# Patient Record
Sex: Male | Born: 1970 | Race: Black or African American | Hispanic: No | Marital: Married | Smoking: Never smoker
Health system: Southern US, Community
[De-identification: ages and names within clinical notes are randomized; demographics above are authoritative.]

## PROBLEM LIST (undated history)

## (undated) DIAGNOSIS — M1990 Unspecified osteoarthritis, unspecified site: Secondary | ICD-10-CM

## (undated) DIAGNOSIS — E785 Hyperlipidemia, unspecified: Secondary | ICD-10-CM

## (undated) DIAGNOSIS — T7840XA Allergy, unspecified, initial encounter: Secondary | ICD-10-CM

## (undated) DIAGNOSIS — K259 Gastric ulcer, unspecified as acute or chronic, without hemorrhage or perforation: Secondary | ICD-10-CM

## (undated) DIAGNOSIS — A048 Other specified bacterial intestinal infections: Secondary | ICD-10-CM

## (undated) HISTORY — PX: KNEE SURGERY: SHX244

## (undated) HISTORY — DX: Gastric ulcer, unspecified as acute or chronic, without hemorrhage or perforation: K25.9

## (undated) HISTORY — DX: Hyperlipidemia, unspecified: E78.5

## (undated) HISTORY — DX: Unspecified osteoarthritis, unspecified site: M19.90

## (undated) HISTORY — DX: Allergy, unspecified, initial encounter: T78.40XA

## (undated) HISTORY — DX: Other specified bacterial intestinal infections: A04.8

## (undated) HISTORY — PX: NO PAST SURGERIES: SHX2092

---

## 2009-10-01 ENCOUNTER — Ambulatory Visit: Payer: Self-pay | Admitting: Family Medicine

## 2009-10-01 DIAGNOSIS — M62838 Other muscle spasm: Secondary | ICD-10-CM | POA: Insufficient documentation

## 2009-10-01 DIAGNOSIS — L819 Disorder of pigmentation, unspecified: Secondary | ICD-10-CM

## 2009-10-08 ENCOUNTER — Encounter: Payer: Self-pay | Admitting: Family Medicine

## 2009-10-22 ENCOUNTER — Ambulatory Visit: Payer: Self-pay | Admitting: Family Medicine

## 2009-10-22 DIAGNOSIS — N50812 Left testicular pain: Secondary | ICD-10-CM | POA: Insufficient documentation

## 2009-10-22 DIAGNOSIS — N508 Other specified disorders of male genital organs: Secondary | ICD-10-CM | POA: Insufficient documentation

## 2009-10-23 ENCOUNTER — Encounter: Admission: RE | Admit: 2009-10-23 | Discharge: 2009-10-23 | Payer: Self-pay | Admitting: Family Medicine

## 2009-10-23 LAB — CONVERTED CEMR LAB
Albumin: 4.4 g/dL (ref 3.5–5.2)
Alkaline Phosphatase: 62 units/L (ref 39–117)
BUN: 16 mg/dL (ref 6–23)
Basophils Relative: 0.5 % (ref 0.0–3.0)
Calcium: 9.6 mg/dL (ref 8.4–10.5)
Chloride: 103 meq/L (ref 96–112)
Direct LDL: 150.1 mg/dL
Eosinophils Absolute: 0.2 10*3/uL (ref 0.0–0.7)
GFR calc non Af Amer: 52.12 mL/min (ref >60)
Hemoglobin: 17 g/dL (ref 13.0–17.0)
Monocytes Relative: 6.4 % (ref 3.0–12.0)
Neutrophils Relative %: 65.9 % (ref 43.0–77.0)
Platelets: 174 10*3/uL (ref 150.0–400.0)
Potassium: 4.2 meq/L (ref 3.5–5.1)
RBC: 5.6 M/uL (ref 4.22–5.81)
RDW: 14.1 % (ref 11.5–14.6)
TSH: 1.96 microintl units/mL (ref 0.35–5.50)
Total Protein: 7.7 g/dL (ref 6.0–8.3)
WBC: 7.6 10*3/uL (ref 4.5–10.5)

## 2009-10-24 ENCOUNTER — Telehealth (INDEPENDENT_AMBULATORY_CARE_PROVIDER_SITE_OTHER): Payer: Self-pay | Admitting: *Deleted

## 2010-05-12 NOTE — Progress Notes (Signed)
Summary: ultrasound-lmom 7/15, 7/18. 7/21  Phone Note Outgoing Call   Call placed by: Doristine Devoid CMA,  October 24, 2009 2:57 PM Call placed to: Patient Summary of Call: varicocele is a normal variation and nothing to be concerned about  Follow-up for Phone Call        left message on machine ........Marland KitchenDoristine Devoid CMA  October 24, 2009 2:57 PM   Additional Follow-up for Phone Call Additional follow up Details #1::        Returned Pt call: voicemail said unavailabe at this time 9:06am.................................[facility], CMA...Marland KitchenMarland Kitchen7/18/11 Additional Follow-up by: Kathrynn Speed CMA,  October 27, 2009 9:06 AM    Additional Follow-up for Phone Call Additional follow up Details #2::    Left message on voicemail to call back to office. Lucious Groves CMA  October 30, 2009 4:34 PM   no return call will await patient phone call .....Marland KitchenMarland KitchenDoristine Devoid CMA  November 03, 2009 9:13 AM

## 2010-05-12 NOTE — Assessment & Plan Note (Signed)
Summary: CPX/PT WILL BE FASTING//KN   Vital Signs:  Patient profile:   40 year old male Height:      72 inches Weight:      233 pounds BMI:     31.71 Pulse rate:   66 / minute BP sitting:   126 / 74  (left arm)  Vitals Entered By: Doristine Devoid CMA (October 22, 2009 8:29 AM) CC: CPX AND LABS   History of Present Illness: 40 yo man here today for CPE.  no concerns.  Preventive Screening-Counseling & Management  Alcohol-Tobacco     Alcohol drinks/day: 0     Smoking Status: never  Caffeine-Diet-Exercise     Does Patient Exercise: yes      Sexual History:  currently monogamous.        Drug Use:  never.    Problems Prior to Update: 1)  Testicular Mass, Left  (ICD-608.89) 2)  Physical Examination  (ICD-V70.0) 3)  Lentigo  (ICD-709.09) 4)  Muscle Spasm, Trapezius  (ICD-728.85)  Current Medications (verified): 1)  Naproxen 500 Mg Tabs (Naproxen) .Marland Kitchen.. 1 Tab By Mouth Two Times A Day X7-10 Days and Then As Needed.  Take W/ Food.  Allergies (verified): No Known Drug Allergies  Past History:  Past Medical History: Last updated: 10/01/2009 none reported  Past Surgical History: Last updated: 10/01/2009 none reported  Family History: Last updated: 10/01/2009 CAD-no HTN-grandparents DM-no STOKE-maternal grandparent COLON CA-no PROSTATE CA-no  Social History: Engaged to [employer] Never Smoked Alcohol use-no Drug use-no Does Patient Exercise:  yes Sexual History:  currently monogamous Drug Use:  never  Review of Systems  The patient denies anorexia, fever, weight loss, weight gain, vision loss, decreased hearing, hoarseness, chest pain, syncope, dyspnea on exertion, peripheral edema, prolonged cough, headaches, abdominal pain, melena, hematochezia, severe indigestion/heartburn, hematuria, suspicious skin lesions, depression, abnormal bleeding, enlarged lymph nodes, and testicular masses.    Physical Exam  General:  Well-developed,well-nourished,in no  acute distress; alert,appropriate and cooperative throughout examination Head:  Normocephalic and atraumatic without obvious abnormalities. No apparent alopecia or balding. Eyes:  No corneal or conjunctival inflammation noted. EOMI. Perrla. Funduscopic exam benign, without hemorrhages, exudates or papilledema. Vision grossly normal. Ears:  External ear exam shows no significant lesions or deformities.  Otoscopic examination reveals clear canals, tympanic membranes are intact bilaterally without bulging, retraction, inflammation or discharge. Hearing is grossly normal bilaterally. Nose:  External nasal examination shows no deformity or inflammation. Nasal mucosa are pink and moist without lesions or exudates.  + turbinate edema Mouth:  Oral mucosa and oropharynx without lesions or exudates.  Teeth in good repair. Neck:  No deformities, masses, or tenderness noted. Lungs:  Normal respiratory effort, chest expands symmetrically. Lungs are clear to auscultation, no crackles or wheezes. Heart:  Normal rate and regular rhythm. S1 and S2 normal without gallop, murmur, click, rub or other extra sounds. Abdomen:  Bowel sounds positive,abdomen soft and non-tender without masses, organomegaly or hernias noted. Genitalia:  Testes bilaterally descended with small, firm nodule on posterior/superior L testicle. no tenderness. No scrotal masses or lesions. No penis lesions or urethral discharge. Msk:  No deformity or scoliosis noted of thoracic or lumbar spine.   Pulses:  +2 carotid, radial, DP Extremities:  No clubbing, cyanosis, edema, or deformity noted with normal full range of motion of all joints.   Neurologic:  alert & oriented X3, cranial nerves II-XII intact, strength normal in all extremities, sensation intact to light touch, and DTRs symmetrical and normal.   Skin:  large hyperpigmented area on R medial calf, no plaques or papules Cervical Nodes:  No lymphadenopathy noted Inguinal Nodes:  No significant  adenopathy Psych:  Cognition and judgment appear intact. Alert and cooperative with normal attention span and concentration. No apparent delusions, illusions, hallucinations   Impression & Recommendations:  Problem # 1:  PHYSICAL EXAMINATION (ICD-V70.0) Assessment New pt's PE WNL w/ exception of testicular mass (see below).  check labs.  anticipatory guidance provided. Orders: Venipuncture (54098) TLB-Lipid Panel (80061-LIPID) TLB-BMP (Basic Metabolic Panel-BMET) (80048-METABOL) TLB-CBC Platelet - w/Differential (85025-CBCD) TLB-Hepatic/Liver Function Pnl (80076-HEPATIC) TLB-TSH (Thyroid Stimulating Hormone) (84443-TSH)  Problem # 2:  TESTICULAR MASS, LEFT (JXB-147.82) Assessment: New need [place] evaluation Orders: Radiology Referral (Radiology)  Complete Medication List: 1)  Naproxen 500 Mg Tabs (Naproxen) .Marland Kitchen.. 1 tab by mouth two times a day x7-10 days and then as needed.  take w/ food.  Patient Instructions: 1)  Follow up in 1 year or as needed 2)  We'll notify you of your lab results 3)  Someone will call you with your ultrasound appt 4)  Keep up the good work on diet and exercise 5)  Call with any questions or concerns 6)  Have a great time at the beach!

## 2010-05-12 NOTE — Assessment & Plan Note (Signed)
Summary: NEW TO EST--PH   Vital Signs:  Patient profile:   40 year old male Height:      72 inches Weight:      235 pounds BMI:     31.99 Pulse rate:   60 / minute BP sitting:   110 / 80  (left arm)  Vitals Entered By: Doristine Devoid (October 01, 2009 3:44 PM) CC: NEW EST-    History of Present Illness: 40 yo man here today to establish care.  has not seen MD for a physical since childhood.  1) neck pain- pt works out frequently and 18 months ago 'pulled something' in his neck.  R sided.  no pain w/ working out but pain w/ movement of the neck.  never required time away from exercise.  not using anything for pain.  no numbness or tingling down the arm.  2) Lentigo- pt was dx'd as a child, reports it will 'act up'.  area on R calf is very itchy intermittantly.  hyperpigmented.  pt doesn't feel area is spreading.  mom feels area is lichen planus b/c this is what she has.  Preventive Screening-Counseling & Management  Alcohol-Tobacco     Smoking Status: never      Drug Use:  no.    Allergies (verified): No Known Drug Allergies  Past History:  Past Medical History: none reported  Past Surgical History: none reported  Family History: CAD-no HTN-grandparents DM-no STOKE-maternal grandparent COLON CA-no PROSTATE CA-no  Social History: Single Never Smoked Alcohol use-no Drug use-no Smoking Status:  never Drug Use:  no  Review of Systems      See HPI  Physical Exam  General:  Well-developed,well-nourished,in no acute distress; alert,appropriate and cooperative throughout examination Neck:  + trapezius spasm on R Msk:  full ROM of shoulders bilaterally full flexion/extension of neck Pulses:  +2 carotid, radial Extremities:  no C/C/E Neurologic:  alert & oriented X3, cranial nerves II-XII intact, strength normal in all extremities, sensation intact to light touch, and DTRs symmetrical and normal.   Skin:  large hyperpigmented area on R medial calf, no plaques or  papules Cervical Nodes:  No lymphadenopathy noted   Impression & Recommendations:  Problem # 1:  MUSCLE SPASM, TRAPEZIUS (ICD-728.85) Assessment New pt's pain is likely due to his trapezius spasm.  start NSAIDs, reviewed heat and ice regimen before/after exercise.  no numbness or weakness on neuro exam or hx.  reviewed supportive care and red flags that should prompt return.  Pt expresses understanding and is in agreement w/ this plan.  Problem # 2:  LENTIGO (ICD-709.09) Assessment: New not sure if area is truly lentigo.  will refer to derm given increased risk of melanoma in affected area if it is lentigo.  Orders: Dermatology Referral (Derma)  Complete Medication List: 1)  Naproxen 500 Mg Tabs (Naproxen) .Marland Kitchen.. 1 tab by mouth two times a day x7-10 days and then as needed.  take w/ food.  Patient Instructions: 1)  Please schedule a complete physical in the next 2-4 weeks- do not eat or drink before this appt 2)  Take the Naproxen as directed- take w/ food.  This will decrease the muscle inflammation 3)  Use heat before exercise, ice after 4)  Someone will call you with your dermatology appt 5)  Welcome!  We're glad to have you! Prescriptions: NAPROXEN 500 MG TABS (NAPROXEN) 1 tab by mouth two times a day x7-10 days and then as needed.  take w/ food.  #60 x  1   Entered and Authorized by:   Neena Rhymes MD   Signed by:   Neena Rhymes MD on 10/01/2009   Method used:   Electronically to        [facility] (retail)       [address]       [place], [place]  [address]       Ph: [phone]       Fax: [phone]   RxID:   [id number]

## 2010-05-12 NOTE — Consult Note (Signed)
Summary: [facility]   Imported By: Lanelle Bal 10/20/2009 09:47:09  _____________________________________________________________________  External Attachment:    Type:   Image     Comment:   External Document

## 2011-11-04 ENCOUNTER — Encounter: Payer: Self-pay | Admitting: Family Medicine

## 2011-11-04 ENCOUNTER — Ambulatory Visit (INDEPENDENT_AMBULATORY_CARE_PROVIDER_SITE_OTHER): Payer: 59 | Admitting: Family Medicine

## 2011-11-04 VITALS — BP 132/78 | HR 67 | Temp 99.1°F | Ht 71.0 in | Wt 226.0 lb

## 2011-11-04 DIAGNOSIS — Z0000 Encounter for general adult medical examination without abnormal findings: Secondary | ICD-10-CM

## 2011-11-04 DIAGNOSIS — Z23 Encounter for immunization: Secondary | ICD-10-CM

## 2011-11-04 DIAGNOSIS — Z20828 Contact with and (suspected) exposure to other viral communicable diseases: Secondary | ICD-10-CM

## 2011-11-04 NOTE — Patient Instructions (Addendum)
Follow up in 1 year or as needed We'll notify you of your lab results Keep up the good work!  You look great! Call with any questions or concerns Congrats on the baby!!!

## 2011-11-04 NOTE — Progress Notes (Signed)
  Subjective:    Patient ID: William Leon, male    DOB: Feb 08, 1971, 41 y.o.   MRN: [id number]  HPI CPE- no concerns today except for decreased energy level.  Works in the heat.   Review of Systems Patient reports no vision/hearing changes, anorexia, fever ,adenopathy, persistant/recurrent hoarseness, swallowing issues, chest pain, palpitations, edema, persistant/recurrent cough, hemoptysis, dyspnea (rest,exertional, paroxysmal nocturnal), gastrointestinal  bleeding (melena, rectal bleeding), abdominal pain, excessive heart burn, GU symptoms (dysuria, hematuria, voiding/incontinence issues) syncope, focal weakness, memory loss, numbness & tingling, skin/hair/nail changes, depression, anxiety, abnormal bruising/bleeding, musculoskeletal symptoms/signs.     Objective:   Physical Exam BP 132/78  Pulse 67  Temp 99.1 F (37.3 C) (Oral)  Ht 5\' 11"  (1.803 m)  Wt 226 lb (102.513 kg)  BMI 31.52 kg/m2  SpO2 97%  General Appearance:    Alert, cooperative, no distress, appears stated age  Head:    Normocephalic, without obvious abnormality, atraumatic  Eyes:    PERRL, conjunctiva/corneas clear, EOM's intact, fundi    benign, both eyes       Ears:    Normal TM's and external ear canals, both ears  Nose:   Nares normal, septum midline, mucosa normal, no drainage   or sinus tenderness  Throat:   Lips, mucosa, and tongue normal; teeth and gums normal  Neck:   Supple, symmetrical, trachea midline, no adenopathy;       thyroid:  No enlargement/tenderness/nodules  Back:     Symmetric, no curvature, ROM normal, no CVA tenderness  Lungs:     Clear to auscultation bilaterally, respirations unlabored  Chest wall:    No tenderness or deformity  Heart:    Regular rate and rhythm, S1 and S2 normal, no murmur, rub   or gallop  Abdomen:     Soft, non-tender, bowel sounds active all four quadrants,    no masses, no organomegaly  Genitalia:    Normal male without lesion, discharge or tenderness  Rectal:     Deferred due to young age  Extremities:   Extremities normal, atraumatic, no cyanosis or edema  Pulses:   2+ and symmetric all extremities  Skin:   Skin color, texture, turgor normal, no rashes or lesions  Lymph nodes:   Cervical, supraclavicular, and axillary nodes normal  Neurologic:   CNII-XII intact. Normal strength, sensation and reflexes      throughout          Assessment & Plan:

## 2011-11-04 NOTE — Assessment & Plan Note (Signed)
Pt's PE WNL.  Check labs.  Tdap given.  Anticipatory guidance provided.

## 2011-11-05 LAB — CBC WITH DIFFERENTIAL/PLATELET
Basophils Relative: 0.1 % (ref 0.0–3.0)
Lymphocytes Relative: 25.2 % (ref 12.0–46.0)
Lymphs Abs: 2 10*3/uL (ref 0.7–4.0)
MCHC: 33.5 g/dL (ref 30.0–36.0)
MCV: 87.8 fl (ref 78.0–100.0)
Monocytes Absolute: 0.3 10*3/uL (ref 0.1–1.0)
Neutro Abs: 5.3 10*3/uL (ref 1.4–7.7)

## 2011-11-05 LAB — BASIC METABOLIC PANEL
BUN: 19 mg/dL (ref 6–23)
CO2: 27 mEq/L (ref 19–32)
Calcium: 9.2 mg/dL (ref 8.4–10.5)
Chloride: 104 mEq/L (ref 96–112)
Creatinine, Ser: 1.6 mg/dL — ABNORMAL HIGH (ref 0.4–1.5)
GFR: 51.98 mL/min — ABNORMAL LOW (ref >60.00)
Glucose, Bld: 86 mg/dL (ref 70–99)

## 2011-11-05 LAB — HEPATIC FUNCTION PANEL
Albumin: 4.2 g/dL (ref 3.5–5.2)
Alkaline Phosphatase: 55 U/L (ref 39–117)

## 2011-11-05 LAB — LIPID PANEL: Triglycerides: 165 mg/dL — ABNORMAL HIGH (ref 0.0–149.0)

## 2011-11-08 ENCOUNTER — Encounter: Payer: Self-pay | Admitting: *Deleted

## 2011-11-12 ENCOUNTER — Encounter: Payer: Self-pay | Admitting: *Deleted

## 2012-08-04 ENCOUNTER — Encounter: Payer: Self-pay | Admitting: *Deleted

## 2012-08-04 ENCOUNTER — Encounter: Payer: Self-pay | Admitting: Internal Medicine

## 2012-08-04 ENCOUNTER — Ambulatory Visit (INDEPENDENT_AMBULATORY_CARE_PROVIDER_SITE_OTHER): Payer: 59 | Admitting: Internal Medicine

## 2012-08-04 ENCOUNTER — Ambulatory Visit (INDEPENDENT_AMBULATORY_CARE_PROVIDER_SITE_OTHER)
Admission: RE | Admit: 2012-08-04 | Discharge: 2012-08-04 | Disposition: A | Discharge status: Home / Self Care | Payer: 59 | Source: Ambulatory Visit | Attending: Internal Medicine | Admitting: Internal Medicine

## 2012-08-04 ENCOUNTER — Encounter: Payer: Self-pay | Admitting: Family Medicine

## 2012-08-04 VITALS — BP 122/82 | HR 68 | Wt 229.0 lb

## 2012-08-04 DIAGNOSIS — S99922A Unspecified injury of left foot, initial encounter: Secondary | ICD-10-CM | POA: Insufficient documentation

## 2012-08-04 DIAGNOSIS — S99929A Unspecified injury of unspecified foot, initial encounter: Secondary | ICD-10-CM

## 2012-08-04 DIAGNOSIS — S8990XA Unspecified injury of unspecified lower leg, initial encounter: Secondary | ICD-10-CM

## 2012-08-04 NOTE — Patient Instructions (Signed)
Please get your x-ray at the other [facility]  office located at: [address], across from [facility].  Please go to the basement, this is a walk-in facility, they are open from 8:30 to 5:30 PM. Phone number [phone].  

## 2012-08-04 NOTE — Assessment & Plan Note (Addendum)
Severe pain @ L heel after a injury last night, unable to bear weight Plan:  XR , ortho Today, patient cell phone [phone] Unfortunately we don't have crutches his size, i apologized

## 2012-08-04 NOTE — Progress Notes (Signed)
  Subjective:    Patient ID: Natalie Leclaire, male    DOB: 07-22-1970, 42 y.o.   MRN: [id number]  HPI Acute visit Landed on his left heel last night, he hurt immediately after the accident, pain worse today, he also has noted some swelling. Unable to bear weight.  No past medical history on file.  Past Surgical History  Procedure Laterality Date  . No past surgeries       Review of Systems Denies any other injuries. No ankle pain or swelling. No previous injury in that area.     Objective:   Physical Exam  General -- alert, well-developed, Unable to bear weight .   Extremities--  no pretibial edema bilaterally ankles normal bilaterally. Right heel normal Left heel without deformities, mild swelling medially, very tender to palpation at the plantar aspect.  Neurologic-- alert & oriented X3 and strength normal in all extremities. Psych-- Cognition and judgment appear intact. Alert and cooperative with normal attention span and concentration.  not anxious appearing and not depressed appearing.       Assessment & Plan:

## 2012-12-15 ENCOUNTER — Encounter: Payer: 59 | Admitting: Family Medicine

## 2013-01-02 ENCOUNTER — Telehealth: Payer: Self-pay

## 2013-01-02 NOTE — Telephone Encounter (Signed)
No medications for this patient Flu Vaccine at work Had recent lab work for his job; advised to bring with him Would like to have STD test ran

## 2013-01-02 NOTE — Telephone Encounter (Signed)
LVM for CB Due for flu vaccine No other HM findings

## 2013-01-03 ENCOUNTER — Encounter: Payer: Self-pay | Admitting: Family Medicine

## 2013-01-03 ENCOUNTER — Ambulatory Visit (INDEPENDENT_AMBULATORY_CARE_PROVIDER_SITE_OTHER): Payer: PRIVATE HEALTH INSURANCE | Admitting: Family Medicine

## 2013-01-03 VITALS — BP 120/82 | HR 82 | Temp 98.3°F | Ht 71.0 in | Wt 231.2 lb

## 2013-01-03 DIAGNOSIS — H6120 Impacted cerumen, unspecified ear: Secondary | ICD-10-CM

## 2013-01-03 DIAGNOSIS — S99922D Unspecified injury of left foot, subsequent encounter: Secondary | ICD-10-CM

## 2013-01-03 DIAGNOSIS — Z20828 Contact with and (suspected) exposure to other viral communicable diseases: Secondary | ICD-10-CM

## 2013-01-03 DIAGNOSIS — H6122 Impacted cerumen, left ear: Secondary | ICD-10-CM

## 2013-01-03 DIAGNOSIS — Z0000 Encounter for general adult medical examination without abnormal findings: Secondary | ICD-10-CM

## 2013-01-03 DIAGNOSIS — Z5189 Encounter for other specified aftercare: Secondary | ICD-10-CM

## 2013-01-03 LAB — BASIC METABOLIC PANEL
BUN: 19 mg/dL (ref 6–23)
Creatinine, Ser: 1.6 mg/dL — ABNORMAL HIGH (ref 0.4–1.5)
GFR: 61.2 mL/min (ref >60.00)
Glucose, Bld: 91 mg/dL (ref 70–99)
Potassium: 3.8 mEq/L (ref 3.5–5.1)
Sodium: 140 mEq/L (ref 135–145)

## 2013-01-03 LAB — CBC WITH DIFFERENTIAL/PLATELET
Basophils Absolute: 0 10*3/uL (ref 0.0–0.1)
Eosinophils Absolute: 0.3 10*3/uL (ref 0.0–0.7)
Eosinophils Relative: 4 % (ref 0.0–5.0)
HCT: 50.2 % (ref 39.0–52.0)
Hemoglobin: 17 g/dL (ref 13.0–17.0)
MCHC: 33.9 g/dL (ref 30.0–36.0)
Monocytes Absolute: 0.5 10*3/uL (ref 0.1–1.0)
Neutro Abs: 5 10*3/uL (ref 1.4–7.7)
Platelets: 190 10*3/uL (ref 150.0–400.0)

## 2013-01-03 LAB — HEPATIC FUNCTION PANEL
ALT: 36 U/L (ref 0–53)
AST: 38 U/L — ABNORMAL HIGH (ref 0–37)
Alkaline Phosphatase: 51 U/L (ref 39–117)
Bilirubin, Direct: 0 mg/dL (ref 0.0–0.3)
Total Protein: 6.9 g/dL (ref 6.0–8.3)

## 2013-01-03 LAB — LIPID PANEL: VLDL: 28.2 mg/dL (ref 0.0–40.0)

## 2013-01-03 LAB — TSH: TSH: 0.76 u[IU]/mL (ref 0.35–5.50)

## 2013-01-03 NOTE — Patient Instructions (Addendum)
We'll notify you of your lab results and make any changes if needed Keep up the good work!  You look great! Call with any questions or concerns Happy Fall! 

## 2013-01-03 NOTE — Progress Notes (Signed)
  Subjective:    Patient ID: Jawann Urbani, male    DOB: 08-Feb-1971, 42 y.o.   MRN: [id number]  HPI CPE- no concerns, not fasting this AM (had high fat breakfast).  Had insurance screening in July and fasting sugar and cholesterol was normal.  L heel injury- saw [facility], wore cast x2 weeks and saw ortho 3x in that time (total $1200).  Continues to have L heel pain in the AM but this improves as the day goes on.  Interested in seeing Sports Med to ensure healing.   Review of Systems Patient reports no vision/hearing changes, anorexia, fever ,adenopathy, persistant/recurrent hoarseness, swallowing issues, chest pain, palpitations, edema, persistant/recurrent cough, hemoptysis, dyspnea (rest,exertional, paroxysmal nocturnal), gastrointestinal  bleeding (melena, rectal bleeding), abdominal pain, excessive heart burn, GU symptoms (dysuria, hematuria, voiding/incontinence issues) syncope, focal weakness, memory loss, numbness & tingling, skin/hair/nail changes, depression, anxiety, abnormal bruising/bleeding.     Objective:   Physical Exam BP 120/82  Pulse 82  Temp(Src) 98.3 F (36.8 C) (Oral)  Ht 5\' 11"  (1.803 m)  Wt 231 lb 3.2 oz (104.872 kg)  BMI 32.26 kg/m2  SpO2 97%  General Appearance:    Alert, cooperative, no distress, appears stated age  Head:    Normocephalic, without obvious abnormality, atraumatic  Eyes:    PERRL, conjunctiva/corneas clear, EOM's intact, fundi    benign, both eyes       Ears:    L ear w/ cerumen impaction- removed w/ curette and forceps.  TMs WNL bilaterally  Nose:   Nares normal, septum midline, mucosa normal, no drainage   or sinus tenderness  Throat:   Lips, mucosa, and tongue normal; teeth and gums normal  Neck:   Supple, symmetrical, trachea midline, no adenopathy;       thyroid:  No enlargement/tenderness/nodules; no carotid   bruit or JVD  Back:     Symmetric, no curvature, ROM normal, no CVA tenderness  Lungs:     Clear to auscultation bilaterally,  respirations unlabored  Chest wall:    No tenderness or deformity  Heart:    Regular rate and rhythm, S1 and S2 normal, no murmur, rub   or gallop  Abdomen:     Soft, non-tender, bowel sounds active all four quadrants,    no masses, no organomegaly  Genitalia:    Normal male without lesion, discharge or tenderness  Rectal:    Deferred due to young age  Extremities:   Extremities normal, atraumatic, no cyanosis or edema  Pulses:   2+ and symmetric all extremities  Skin:   Skin color, texture, turgor normal, no rashes or lesions  Lymph nodes:   Cervical, supraclavicular, and axillary nodes normal  Neurologic:   CNII-XII intact. Normal strength, sensation and reflexes      throughout          Assessment & Plan:

## 2013-01-04 ENCOUNTER — Telehealth: Payer: Self-pay | Admitting: Family Medicine

## 2013-01-04 LAB — GC/CHLAMYDIA PROBE AMP

## 2013-01-04 NOTE — Telephone Encounter (Signed)
Called pt and LMOVM to return call.  °

## 2013-01-04 NOTE — Telephone Encounter (Signed)
It seems that pt did not give sample- if he still wants to be checked for gonorrhea or chlamydia, he'll need to swing by and give a urine sample

## 2013-01-04 NOTE — Telephone Encounter (Signed)
[facility] lab is calling because they did not receive any urine for the GC/Chlamydia Probe order they received. Needs to be re-submitted if patient still needs.

## 2013-01-05 ENCOUNTER — Encounter: Payer: Self-pay | Admitting: *Deleted

## 2013-01-05 NOTE — Telephone Encounter (Signed)
Spoke with pt.notified that we would need another urine sample.

## 2013-01-05 NOTE — Telephone Encounter (Signed)
Called and left another message on pt's machine.

## 2013-01-07 DIAGNOSIS — H6120 Impacted cerumen, unspecified ear: Secondary | ICD-10-CM | POA: Insufficient documentation

## 2013-01-07 NOTE — Assessment & Plan Note (Signed)
New to provider.  Pt was following w/ ortho but was struggling to pay ortho co-pays and charges.  Never followed up to injury resolution.  Refer to primary care sports med.  Pt expressed understanding and is in agreement w/ plan.

## 2013-01-07 NOTE — Assessment & Plan Note (Signed)
Pt's PE WNL w/ exception of L cerumen impaction.  Check labs.  Anticipatory guidance provided.

## 2013-01-07 NOTE — Assessment & Plan Note (Signed)
New.  Removed w/ curette and forceps.  Pt's sxs of ear fullness and decreased hearing immediately improved.

## 2014-02-08 ENCOUNTER — Encounter: Payer: Self-pay | Admitting: Family Medicine

## 2014-02-08 ENCOUNTER — Ambulatory Visit (INDEPENDENT_AMBULATORY_CARE_PROVIDER_SITE_OTHER): Payer: PRIVATE HEALTH INSURANCE | Admitting: Family Medicine

## 2014-02-08 VITALS — BP 116/74 | HR 71 | Temp 98.2°F | Resp 16 | Ht 72.0 in | Wt 238.0 lb

## 2014-02-08 DIAGNOSIS — Z0000 Encounter for general adult medical examination without abnormal findings: Secondary | ICD-10-CM

## 2014-02-08 DIAGNOSIS — Z202 Contact with and (suspected) exposure to infections with a predominantly sexual mode of transmission: Secondary | ICD-10-CM | POA: Insufficient documentation

## 2014-02-08 LAB — HEPATIC FUNCTION PANEL
ALBUMIN: 3.6 g/dL (ref 3.5–5.2)
ALT: 31 U/L (ref 0–53)
AST: 31 U/L (ref 0–37)
Alkaline Phosphatase: 47 U/L (ref 39–117)
BILIRUBIN DIRECT: 0 mg/dL (ref 0.0–0.3)
BILIRUBIN TOTAL: 1 mg/dL (ref 0.2–1.2)
Total Protein: 7.1 g/dL (ref 6.0–8.3)

## 2014-02-08 LAB — CBC WITH DIFFERENTIAL/PLATELET
BASOS ABS: 0 10*3/uL (ref 0.0–0.1)
Basophils Relative: 0.4 % (ref 0.0–3.0)
EOS ABS: 0.2 10*3/uL (ref 0.0–0.7)
EOS PCT: 2.5 % (ref 0.0–5.0)
HEMATOCRIT: 53.8 % — AB (ref 39.0–52.0)
HEMOGLOBIN: 17.6 g/dL — AB (ref 13.0–17.0)
Lymphocytes Relative: 25.7 % (ref 12.0–46.0)
Lymphs Abs: 1.9 10*3/uL (ref 0.7–4.0)
MCHC: 32.8 g/dL (ref 30.0–36.0)
MCV: 88.8 fl (ref 78.0–100.0)
MONOS PCT: 6.1 % (ref 3.0–12.0)
Monocytes Absolute: 0.5 10*3/uL (ref 0.1–1.0)
Neutro Abs: 4.9 10*3/uL (ref 1.4–7.7)
Neutrophils Relative %: 65.3 % (ref 43.0–77.0)
Platelets: 185 10*3/uL (ref 150.0–400.0)
RBC: 6.05 Mil/uL — AB (ref 4.22–5.81)
RDW: 14 % (ref 11.5–15.5)
WBC: 7.5 10*3/uL (ref 4.0–10.5)

## 2014-02-08 LAB — TSH: TSH: 0.84 u[IU]/mL (ref 0.35–4.50)

## 2014-02-08 LAB — BASIC METABOLIC PANEL
BUN: 18 mg/dL (ref 6–23)
CO2: 25 mEq/L (ref 19–32)
Calcium: 9.4 mg/dL (ref 8.4–10.5)
Chloride: 106 mEq/L (ref 96–112)
Creatinine, Ser: 1.8 mg/dL — ABNORMAL HIGH (ref 0.4–1.5)
GFR: 53.45 mL/min — AB (ref >60.00)
GLUCOSE: 89 mg/dL (ref 70–99)
POTASSIUM: 4.1 meq/L (ref 3.5–5.1)
SODIUM: 140 meq/L (ref 135–145)

## 2014-02-08 LAB — LIPID PANEL
CHOL/HDL RATIO: 5
Cholesterol: 197 mg/dL (ref 0–200)
HDL: 37.7 mg/dL — AB (ref >39.00)
LDL CALC: 143 mg/dL — AB (ref 0–99)
NONHDL: 159.3
Triglycerides: 83 mg/dL (ref 0.0–149.0)
VLDL: 16.6 mg/dL (ref 0.0–40.0)

## 2014-02-08 LAB — HIV ANTIBODY (ROUTINE TESTING W REFLEX): HIV 1&2 Ab, 4th Generation: NONREACTIVE

## 2014-02-08 NOTE — Patient Instructions (Signed)
Follow up in 1 year or as needed We'll notify you of your lab results and make any changes if needed Keep up the good work!  You look great! Call with any questions or concerns Hang in there!!!

## 2014-02-08 NOTE — Progress Notes (Signed)
   Subjective:    Patient ID: William Leon, male    DOB: 1970/09/07, 43 y.o.   MRN: [id number]  HPI CPE- pt requesting all STD testing after separating from fiance.   Review of Systems Patient reports no vision/hearing changes, anorexia, fever ,adenopathy, persistant/recurrent hoarseness, swallowing issues, chest pain, palpitations, edema, persistant/recurrent cough, hemoptysis, dyspnea (rest,exertional, paroxysmal nocturnal), gastrointestinal  bleeding (melena, rectal bleeding), abdominal pain, excessive heart burn, GU symptoms (dysuria, hematuria, voiding/incontinence issues) syncope, focal weakness, memory loss, numbness & tingling, skin/hair/nail changes, depression, anxiety, abnormal bruising/bleeding, musculoskeletal symptoms/signs.     Objective:   Physical Exam General Appearance:    Alert, cooperative, no distress, appears stated age.  Very muscular  Head:    Normocephalic, without obvious abnormality, atraumatic  Eyes:    PERRL, conjunctiva/corneas clear, EOM's intact, fundi    benign, both eyes       Ears:    Normal TM's and external ear canals, both ears  Nose:   Nares normal, septum midline, mucosa normal, no drainage   or sinus tenderness  Throat:   Lips, mucosa, and tongue normal; teeth and gums normal  Neck:   Supple, symmetrical, trachea midline, no adenopathy;       thyroid:  No enlargement/tenderness/nodules  Back:     Symmetric, no curvature, ROM normal, no CVA tenderness  Lungs:     Clear to auscultation bilaterally, respirations unlabored  Chest wall:    No tenderness or deformity  Heart:    Regular rate and rhythm, S1 and S2 normal, no murmur, rub   or gallop  Abdomen:     Soft, non-tender, bowel sounds active all four quadrants,    no masses, no organomegaly  Genitalia:    Normal male without lesion, masses,discharge or tenderness.  Small L varicocele noted  Rectal:    Deferred due to young age  Extremities:   Extremities normal, atraumatic, no cyanosis or edema    Pulses:   2+ and symmetric all extremities  Skin:   Skin color, texture, turgor normal, no rashes or lesions  Lymph nodes:   Cervical, supraclavicular, and axillary nodes normal  Neurologic:   CNII-XII intact. Normal strength, sensation and reflexes      throughout          Assessment & Plan:

## 2014-02-08 NOTE — Progress Notes (Signed)
Pre visit review using our clinic review tool, if applicable. No additional management support is needed unless otherwise documented below in the visit note. 

## 2014-02-09 LAB — GC/CHLAMYDIA PROBE AMP, URINE
CHLAMYDIA, SWAB/URINE, PCR: NEGATIVE
GC Probe Amp, Urine: NEGATIVE

## 2014-02-09 LAB — RPR

## 2014-02-10 NOTE — Assessment & Plan Note (Signed)
Check labs to r/o infxn.  Discussed need for safe sex at each encounter

## 2014-02-10 NOTE — Assessment & Plan Note (Signed)
Pt's PE WNL.  Very muscular but not obese despite BMI.  Check labs.  Anticipatory guidance provided.

## 2014-02-11 LAB — HSV(HERPES SMPLX)ABS-I+II(IGG+IGM)-BLD
HERPES SIMPLEX VRS I-IGM AB (EIA): 0.36 {index}
HSV 1 GLYCOPROTEIN G AB, IGG: 11.55 IV — AB
HSV 2 GLYCOPROTEIN G AB, IGG: 0.5 IV

## 2014-02-13 ENCOUNTER — Ambulatory Visit (HOSPITAL_BASED_OUTPATIENT_CLINIC_OR_DEPARTMENT_OTHER)
Admission: RE | Admit: 2014-02-13 | Discharge: 2014-02-13 | Disposition: A | Discharge status: Home / Self Care | Payer: PRIVATE HEALTH INSURANCE | Source: Ambulatory Visit | Attending: Medical | Admitting: Medical

## 2014-02-13 ENCOUNTER — Other Ambulatory Visit: Payer: Self-pay | Admitting: Family Medicine

## 2014-02-13 ENCOUNTER — Encounter: Payer: Self-pay | Admitting: Medical

## 2014-02-13 ENCOUNTER — Ambulatory Visit (INDEPENDENT_AMBULATORY_CARE_PROVIDER_SITE_OTHER): Payer: PRIVATE HEALTH INSURANCE | Admitting: Medical

## 2014-02-13 VITALS — BP 149/84 | HR 81 | Temp 98.1°F | Ht 72.0 in | Wt 245.8 lb

## 2014-02-13 DIAGNOSIS — M25521 Pain in right elbow: Secondary | ICD-10-CM

## 2014-02-13 DIAGNOSIS — R7989 Other specified abnormal findings of blood chemistry: Secondary | ICD-10-CM

## 2014-02-13 DIAGNOSIS — M25529 Pain in unspecified elbow: Secondary | ICD-10-CM | POA: Insufficient documentation

## 2014-02-13 MED ORDER — DICLOFENAC SODIUM 75 MG PO TBEC: 75.0000 mg | DELAYED_RELEASE_TABLET | Freq: Two times a day (BID) | ORAL | Status: DC

## 2014-02-13 NOTE — Patient Instructions (Addendum)
You may have suffered elbow contusion vs small fracture of elbow. We will get xray of your elbow today. I recommend you get elbow pad or ace wrap until we get xray result. If xray negative and pain persist by one week then may need to repeat xray as sometimes small fractures are not obvious until some healing takes place.  I will send in diclofenac for pain and inflammation.  Follow up in 7 days or as needed.

## 2014-02-13 NOTE — Assessment & Plan Note (Signed)
Pmay have suffered elbow contusion vs small fracture of elbow. We will get xray of your elbow today. I recommend you get elbow pad or ace wrap until we get xray result. If xray negative and pain persist by one week then may need to repeat xray as sometimes small fractures are not obvious until some healing takes place.  I will send in diclofenac for pain and inflammation.

## 2014-02-13 NOTE — Progress Notes (Signed)
Pre visit review using our clinic review tool, if applicable. No additional management support is needed unless otherwise documented below in the visit note. 

## 2014-02-13 NOTE — Progress Notes (Signed)
   Subjective:    Patient ID: William Leon, male    DOB: 07/21/1970, 42 y.o.   MRN: [id number]  HPI   Pt states he has some elbow pain. He hit his elbow on concrete post. Accidentally hit while walking. At the time had moderate to severe pain. Pain has subside but since then he has sharp pain even with mild touch.  No past medical history on file.  History   Social History  . Marital Status: Single    Spouse Name: N/A    Number of Children: N/A  . Years of Education: N/A   Occupational History  . Not on file.   Social History Main Topics  . Smoking status: Never Smoker   . Smokeless tobacco: Not on file  . Alcohol Use: No  . Drug Use: No  . Sexual Activity: Not on file   Other Topics Concern  . Not on file   Social History Narrative    Past Surgical History  Procedure Laterality Date  . No past surgeries      No family history on file.  No Known Allergies  No current outpatient prescriptions on file prior to visit.   No current facility-administered medications on file prior to visit.    BP 149/84 mmHg  Pulse 81  Temp(Src) 98.1 F (36.7 C) (Oral)  Ht 6' (1.829 m)  Wt 245 lb 12.8 oz (111.494 kg)  BMI 33.33 kg/m2  SpO2 97%     Review of Systems  Constitutional: Negative for fever, chills and fatigue.  Musculoskeletal:       Rt elbow pain. None at rest but if something presses even lightly over small area of elbow he will have moderate to severe pain.  Skin: Negative for color change and rash.  Hematological: Negative for adenopathy. Does not bruise/bleed easily.       Objective:   Physical Exam  Rt elbow- very difficult to find point tender area but did find  mid point over elbow very tender area about 8 mm in diameter. When found was very tender to light touch. Flexion and extension no pain.  Skin- no redness, no swelling and no bruise.        Assessment & Plan:

## 2014-02-22 ENCOUNTER — Other Ambulatory Visit (INDEPENDENT_AMBULATORY_CARE_PROVIDER_SITE_OTHER): Payer: PRIVATE HEALTH INSURANCE

## 2014-02-22 ENCOUNTER — Encounter: Payer: Self-pay | Admitting: General Practice

## 2014-02-22 DIAGNOSIS — R7989 Other specified abnormal findings of blood chemistry: Secondary | ICD-10-CM

## 2014-02-22 DIAGNOSIS — R748 Abnormal levels of other serum enzymes: Secondary | ICD-10-CM

## 2014-02-22 LAB — BASIC METABOLIC PANEL
BUN: 17 mg/dL (ref 6–23)
CALCIUM: 9.6 mg/dL (ref 8.4–10.5)
CO2: 28 meq/L (ref 19–32)
CREATININE: 1.7 mg/dL — AB (ref 0.4–1.5)
Chloride: 103 mEq/L (ref 96–112)
GFR: 57.53 mL/min — ABNORMAL LOW (ref >60.00)
GLUCOSE: 113 mg/dL — AB (ref 70–99)
Potassium: 4.2 mEq/L (ref 3.5–5.1)
Sodium: 140 mEq/L (ref 135–145)

## 2014-05-22 ENCOUNTER — Ambulatory Visit (INDEPENDENT_AMBULATORY_CARE_PROVIDER_SITE_OTHER): Payer: PRIVATE HEALTH INSURANCE | Admitting: Family Medicine

## 2014-05-22 ENCOUNTER — Encounter: Payer: Self-pay | Admitting: Family Medicine

## 2014-05-22 ENCOUNTER — Encounter: Payer: Self-pay | Admitting: General Practice

## 2014-05-22 VITALS — BP 134/78 | HR 70 | Temp 98.2°F | Resp 16 | Wt 247.4 lb

## 2014-05-22 DIAGNOSIS — R7989 Other specified abnormal findings of blood chemistry: Secondary | ICD-10-CM | POA: Insufficient documentation

## 2014-05-22 DIAGNOSIS — R748 Abnormal levels of other serum enzymes: Secondary | ICD-10-CM

## 2014-05-22 LAB — BASIC METABOLIC PANEL
BUN: 22 mg/dL (ref 6–23)
CO2: 28 mEq/L (ref 19–32)
Calcium: 9.3 mg/dL (ref 8.4–10.5)
Chloride: 104 mEq/L (ref 96–112)
Creatinine, Ser: 1.54 mg/dL — ABNORMAL HIGH (ref 0.40–1.50)
GFR: 63.1 mL/min (ref >60.00)
Glucose, Bld: 96 mg/dL (ref 70–99)
POTASSIUM: 3.8 meq/L (ref 3.5–5.1)
SODIUM: 137 meq/L (ref 135–145)

## 2014-05-22 NOTE — Patient Instructions (Signed)
Follow up for your yearly physical We'll notify you of your lab results and make any changes if needed Continue to drink plenty of fluids Avoid anti-inflammatories (Advil, Aleve, Motrin, etc) as much as possible Call with any questions or concerns Happy Valentine's Day!!!

## 2014-05-22 NOTE — Progress Notes (Signed)
   Subjective:    Patient ID: William Leon, male    DOB: [birth year], 44 y.o.   MRN: [id number]  HPI Elevated Cr- pt's levels have been stable x4 yrs.  He is concerned b/c he did some reading on the internet.  Doesn't take NSAIDs, no BP meds, no supplements.  Exercising regularly.   Review of Systems For ROS see HPI     Objective:   Physical Exam  Constitutional: He is oriented to person, place, and time. He appears well-developed and well-nourished. No distress.  HENT:  Head: Normocephalic and atraumatic.  Eyes: Conjunctivae and EOM are normal. Pupils are equal, round, and reactive to light.  Neck: Normal range of motion. Neck supple. No thyromegaly present.  Cardiovascular: Normal rate, regular rhythm, normal heart sounds and intact distal pulses.   No murmur heard. Pulmonary/Chest: Effort normal and breath sounds normal. No respiratory distress.  Abdominal: Soft. Bowel sounds are normal. He exhibits no distension.  Musculoskeletal: He exhibits no edema.  Lymphadenopathy:    He has no cervical adenopathy.  Neurological: He is alert and oriented to person, place, and time. No cranial nerve deficit.  Skin: Skin is warm and dry.  Psychiatric: He has a normal mood and affect. His behavior is normal.  Vitals reviewed.         Assessment & Plan:

## 2014-05-22 NOTE — Progress Notes (Signed)
Pre visit review using our clinic review tool, if applicable. No additional management support is needed unless otherwise documented below in the visit note. 

## 2014-05-22 NOTE — Assessment & Plan Note (Signed)
Reviewed variations in creatinine based on gender, muscle mass, and ethnicity.  Pt is a very muscular, African-American man and his Cr has been stable x4 yrs.  Will continue to monitor closely.  Reviewed need for increased water intake and avoidance of NSAIDs.  Repeat BMP.  Pt expressed understanding and is in agreement w/ plan.

## 2014-06-18 ENCOUNTER — Ambulatory Visit (INDEPENDENT_AMBULATORY_CARE_PROVIDER_SITE_OTHER): Payer: PRIVATE HEALTH INSURANCE | Admitting: Family Medicine

## 2014-06-18 ENCOUNTER — Encounter: Payer: Self-pay | Admitting: Family Medicine

## 2014-06-18 ENCOUNTER — Telehealth: Payer: Self-pay | Admitting: Family Medicine

## 2014-06-18 VITALS — BP 120/80 | HR 79 | Temp 98.2°F | Resp 16 | Wt 242.1 lb

## 2014-06-18 DIAGNOSIS — S39848A Other specified injuries of external genitals, initial encounter: Secondary | ICD-10-CM

## 2014-06-18 DIAGNOSIS — S3994XA Unspecified injury of external genitals, initial encounter: Secondary | ICD-10-CM | POA: Insufficient documentation

## 2014-06-18 DIAGNOSIS — R319 Hematuria, unspecified: Secondary | ICD-10-CM | POA: Insufficient documentation

## 2014-06-18 LAB — POCT URINALYSIS DIPSTICK
Bilirubin, UA: NEGATIVE
Blood, UA: 10
Glucose, UA: NEGATIVE
KETONES UA: NEGATIVE
Leukocytes, UA: NEGATIVE
Nitrite, UA: NEGATIVE
PH UA: 5.5
PROTEIN UA: NEGATIVE
Urobilinogen, UA: 0.2

## 2014-06-18 MED ORDER — CIPROFLOXACIN HCL 500 MG PO TABS: 500.0000 mg | ORAL_TABLET | Freq: Two times a day (BID) | ORAL | Status: DC

## 2014-06-18 NOTE — Telephone Encounter (Signed)
atient Name: William Leon  DOB: 09-08-1970    Initial Comment Caller States he is having blood in his urine. he does have an appt today @ 9:45am   Nurse Assessment  Nurse: Raphael Gibney, RN, Vanita Ingles Date/Time (Eastern Time): 06/18/2014 8:12:46 AM  Confirm and document reason for call. If symptomatic, describe symptoms. ---Caller states he has appt at 9:45 am today . Last night he was having intercourse with his wife, he felt a little bit of burning after he ejaculated He noticed last night a little bit of blood on his penis after he ejaculated. He noticed a little bit of blood after he urinated.  Has the patient traveled out of the country within the last 30 days? ---No  Does the patient require triage? ---Yes  Related visit to physician within the last 2 weeks? ---No  Does the PT have any chronic conditions? (i.e. diabetes, asthma, etc.) ---No     Guidelines    Guideline Title Affirmed Question Affirmed Notes  Penis and Scrotum Symptoms Blood in semen    Final Disposition User   See PCP When Office is Open (within 3 days) Raphael Gibney, Therapist, sports, Vanita Ingles

## 2014-06-18 NOTE — Telephone Encounter (Signed)
Appointment scheduled with Dr. Birdie Riddle at 9:45.

## 2014-06-18 NOTE — Progress Notes (Signed)
   Subjective:    Patient ID: William Leon, male    DOB: 08-10-1970, 44 y.o.   MRN: [id number]  HPI Hematuria- pt was attempting new sexual position w/ partner last night and 'hit something'.  Just prior to ejaculation and during ejaculation pt had painful sensation.  Afterwards, pt had drops of blood at the tip of penis.  Had a few drops on the sheets this AM when he woke up and had additional bleeding (drops of blood- not steady stream) w/ urinating this AM.  Faint burning w/ urination.   Review of Systems For ROS see HPI     Objective:   Physical Exam  Constitutional: He is oriented to person, place, and time. He appears well-developed and well-nourished. No distress.  HENT:  Head: Normocephalic and atraumatic.  Genitourinary: Penis normal. No penile tenderness.  Neurological: He is alert and oriented to person, place, and time.  Skin: Skin is warm and dry.  Psychiatric: He has a normal mood and affect. His behavior is normal. Thought content normal.  Vitals reviewed.         Assessment & Plan:

## 2014-06-18 NOTE — Assessment & Plan Note (Signed)
New.  Sustained during intercourse last night while trying a new position.  No obvious deformity of penis.  No visible blood on exam.  Suspect mild tear/urethral abrasion.  Start Cipro for possible infection.  If blood doesn't clear by Thursday, will refer to urology.  Pt expressed understanding and is in agreement w/ plan.

## 2014-06-18 NOTE — Progress Notes (Signed)
Pre visit review using our clinic review tool, if applicable. No additional management support is needed unless otherwise documented below in the visit note. 

## 2014-06-18 NOTE — Assessment & Plan Note (Signed)
New.  Suspect this is due to the penile trauma he sustained during intercourse last night.  Pt having some burning w/ urination and drops of blood at either the onset of urination or at the end of stream- remainder of stream is clear.  Send urine for culture.  Start Cipro in case of infxn.  Reviewed supportive care and red flags that should prompt return.  Pt expressed understanding and is in agreement w/ plan.

## 2014-06-18 NOTE — Patient Instructions (Signed)
If still having blood on Thursday AM, CALL ME so we can send you to urology Start the Cipro twice daily for possible infection We'll notify you of your culture results Start Claritin or Zyrtec daily for allergic symptoms Call with any questions or concerns Hang in there!  This is not unusual and will get better!

## 2014-06-18 NOTE — Addendum Note (Signed)
Addended by: Peggyann Shoals on: 06/18/2014 12:16 PM   Modules accepted: Orders

## 2014-06-20 LAB — URINE CULTURE
COLONY COUNT: NO GROWTH
ORGANISM ID, BACTERIA: NO GROWTH

## 2014-12-04 ENCOUNTER — Ambulatory Visit (INDEPENDENT_AMBULATORY_CARE_PROVIDER_SITE_OTHER): Payer: PRIVATE HEALTH INSURANCE | Admitting: Family Medicine

## 2014-12-04 ENCOUNTER — Encounter: Payer: Self-pay | Admitting: Family Medicine

## 2014-12-04 VITALS — BP 122/80 | HR 79 | Temp 98.0°F | Resp 16 | Wt 238.2 lb

## 2014-12-04 DIAGNOSIS — H578 Other specified disorders of eye and adnexa: Secondary | ICD-10-CM

## 2014-12-04 DIAGNOSIS — M25532 Pain in left wrist: Secondary | ICD-10-CM | POA: Diagnosis not present

## 2014-12-04 DIAGNOSIS — H5789 Other specified disorders of eye and adnexa: Secondary | ICD-10-CM | POA: Insufficient documentation

## 2014-12-04 MED ORDER — MELOXICAM 15 MG PO TABS: 15.0000 mg | ORAL_TABLET | Freq: Every day | ORAL | Status: DC

## 2014-12-04 MED ORDER — OLOPATADINE HCL 0.2 % OP SOLN
1.0000 [drp] | Freq: Every day | OPHTHALMIC | Status: DC
Start: 2014-12-04 — End: 2015-01-29

## 2014-12-04 NOTE — Progress Notes (Signed)
Pre visit review using our clinic review tool, if applicable. No additional management support is needed unless otherwise documented below in the visit note. 

## 2014-12-04 NOTE — Progress Notes (Signed)
   Subjective:    Patient ID: William Leon, male    DOB: 18-May-1970, 44 y.o.   MRN: [id number]  HPI Wrist pain- L sided.  Pt is R hand dominant.  Pain is intermittent but worsening in frequency and severity.  sxs started ~3 weeks ago.  Pt reports pain is along ventral surface of wrist and extends to base of thumb.  Painful to cock wrist back- hold phone, lift weights, pushups.  Denies weakness of L grip but painful to make a fist.  Red eye- no itching or burning, no drainage.  Will improve w/ eye drops but revert almost immediately.   Review of Systems For ROS see HPI     Objective:   Physical Exam  Constitutional: He is oriented to person, place, and time. He appears well-developed and well-nourished. No distress.  HENT:  Head: Normocephalic and atraumatic.  Eyes: EOM are normal. Pupils are equal, round, and reactive to light. Right eye exhibits no discharge. Left eye exhibits no discharge.  Mild conjunctival injxn bilaterally w/o evidence of infxn  Neck: Normal range of motion. Neck supple.  Cardiovascular: Intact distal pulses.   Musculoskeletal: He exhibits tenderness (over 1st [facility] joint L>R). He exhibits no edema.  Lymphadenopathy:    He has no cervical adenopathy.  Neurological: He is alert and oriented to person, place, and time. He has normal reflexes.  (-) Phalen's and Tinnel's  Skin: Skin is warm and dry.  Vitals reviewed.         Assessment & Plan:

## 2014-12-04 NOTE — Patient Instructions (Signed)
Follow up by phone or MyChart in 1-2 weeks Start the Mobic once daily- take w/ food- for inflammation in the wrist Use the eye drops once daily If no improvement in eye or wrist after 1-2 weeks, we'll refer you to a specialist for evaluation and treatment Call with any questions or concerns CONGRATS ON THE W[place]!!!

## 2014-12-09 NOTE — Assessment & Plan Note (Signed)
New.  Suspect overuse injury since returning to the gym w/ possible degenerative changes.  No evidence of carpal tunnel.  Start scheduled NSAIDs.  If no improvement will refer to ortho.  Pt expressed understanding and is in agreement w/ plan.

## 2014-12-09 NOTE — Assessment & Plan Note (Signed)
New.  Suspect this is allergic irritation as sxs improved when pt used allergy meds.  Start Pataday drops daily.  If no improvement will refer to ophtho.  Pt expressed understanding and is in agreement w/ plan.

## 2015-01-08 ENCOUNTER — Encounter: Payer: Self-pay | Admitting: Internal Medicine

## 2015-01-08 ENCOUNTER — Ambulatory Visit (INDEPENDENT_AMBULATORY_CARE_PROVIDER_SITE_OTHER): Payer: PRIVATE HEALTH INSURANCE | Admitting: Internal Medicine

## 2015-01-08 VITALS — BP 118/74 | HR 71 | Temp 98.0°F | Wt 238.5 lb

## 2015-01-08 DIAGNOSIS — S4991XA Unspecified injury of right shoulder and upper arm, initial encounter: Secondary | ICD-10-CM | POA: Diagnosis not present

## 2015-01-08 MED ORDER — MELOXICAM 15 MG PO TABS: 15.0000 mg | ORAL_TABLET | Freq: Every day | ORAL | Status: DC

## 2015-01-08 NOTE — Progress Notes (Signed)
   Subjective:    Patient ID: William Leon, male    DOB: 11-18-1970, 44 y.o.   MRN: [id number]  DOS:  01/08/2015 Type of visit - description : Acute Interval history: 3 weeks ago he was playing volleyball, the next day he developed right shoulder pain located at the upper posterior aspect of the shoulder. Pain is increased when he elevates his right arm or reach back.   Review of Systems No upper extremity paresthesias Denies neck pain No previous shoulder injuries. He is active doing weightlifting  History reviewed. No pertinent past medical history.  Past Surgical History  Procedure Laterality Date  . No past surgeries      Social History   Social History  . Marital Status: Single    Spouse Name: N/A  . Number of Children: N/A  . Years of Education: N/A   Occupational History  . Not on file.   Social History Main Topics  . Smoking status: Never Smoker   . Smokeless tobacco: Not on file  . Alcohol Use: No  . Drug Use: No  . Sexual Activity: Not on file   Other Topics Concern  . Not on file   Social History Narrative        Medication List       This list is accurate as of: 01/08/15 11:59 PM.  Always use your most recent med list.               meloxicam 15 MG tablet  Commonly known as:  MOBIC  Take 1 tablet (15 mg total) by mouth daily.     Olopatadine HCl 0.2 % Soln  Apply 1 drop to eye daily.           Objective:   Physical Exam BP 118/74 mmHg  Pulse 71  Temp(Src) 98 F (36.7 C) (Oral)  Wt 238 lb 8 oz (108.183 kg)  SpO2 98% General:   Well developed, well nourished . NAD.  HEENT:  Normocephalic . Face symmetric, atraumatic Neck: No TTP, full range of motion. MSK: Left shoulder normal Right shoulder: No deformities,passive range of motion normal, active range of motion: Elevation of the arm quite limited, reaching back limited. Bicipital groov, AC joint and biceps no ---- TTP Skin: Not pale. Not jaundice Neurologic:  alert &  oriented X3.  Speech normal, gait appropriate for age and unassisted Psych--  Cognition and judgment appear intact.  Cooperative with normal attention span and concentration.  Behavior appropriate. No anxious or depressed appearing.      Assessment & Plan:   Shoulder pain, Rotator cough tendinitis? Tear?. Plan: Continue with meloxicam, GI precautions discussed (he  has occasional epigastric pain, recommend to decrease meloxicam to every other day and use Tylenol instead) Refer  to sports medicine

## 2015-01-08 NOTE — Patient Instructions (Signed)
We are referring you to a sports medicine doctor  Continue taking meloxicam once daily  as needed for pain.  Always take it with food because may cause gastritis and ulcers.  If you notice nausea, stomach pain, change in the color of stools --->  Stop the medicine and let us know

## 2015-01-08 NOTE — Progress Notes (Signed)
Pre visit review using our clinic review tool, if applicable. No additional management support is needed unless otherwise documented below in the visit note. 

## 2015-01-13 ENCOUNTER — Ambulatory Visit: Payer: PRIVATE HEALTH INSURANCE | Admitting: Family Medicine

## 2015-01-14 ENCOUNTER — Encounter: Payer: Self-pay | Admitting: Family Medicine

## 2015-01-14 ENCOUNTER — Ambulatory Visit (INDEPENDENT_AMBULATORY_CARE_PROVIDER_SITE_OTHER): Payer: PRIVATE HEALTH INSURANCE | Admitting: Family Medicine

## 2015-01-14 VITALS — BP 150/88 | HR 77 | Ht 71.0 in | Wt 235.0 lb

## 2015-01-14 DIAGNOSIS — S4991XA Unspecified injury of right shoulder and upper arm, initial encounter: Secondary | ICD-10-CM

## 2015-01-14 NOTE — Patient Instructions (Signed)
Your pain is consistent with a labrum contusion or a small tear of the labrum. This should heal completely with conservative treatment though. Weight lifting is fine if not painful - I'd avoid pulldowns and any throwing maneuvers for about 4 weeks though. Continue meloxicam as needed with food for pain and inflammation. Can take tylenol in addition to this. Consider physical therapy with transition to home exercise program. Do home exercise program with theraband and scapular stabilization exercises daily - these are very important for long term relief.  3 sets of 10 once a day for 6 weeks. If not improving at follow-up we will consider imaging, injection, physical therapy, and/or nitro patches. Follow up with me in 5-6 weeks if you're having any problems.

## 2015-01-16 DIAGNOSIS — S4991XA Unspecified injury of right shoulder and upper arm, initial encounter: Secondary | ICD-10-CM | POA: Insufficient documentation

## 2015-01-16 NOTE — Assessment & Plan Note (Signed)
consistent with injury to labrum - contusion vs small tear.  He is improving some - will continue meloxicam prescribed by [facility].  Shown home exercises to do daily.  Consider physical therapy, nitro patches, injection, further imaging if not improving as expected over the next 5-6 weeks.  Would avoid overhead motions, weight lifting with arms overhead as much as possible in this time frame.

## 2015-01-16 NOTE — Progress Notes (Signed)
PCP: Annye Asa, MD Referred by: Dr. Larose Kells  Subjective:   HPI: Patient is a 44 y.o. male here for right shoulder injury.  Patient reports 1 month ago when playing volleyball at a family reunion he felt pain the day after spiking the ball in volleyball. Had a little soreness then but wasn't bad till that next day. Pain now 2/77, sharp with certain movements (reaching behind) deep pain is in the shoulder ant/posteriorly. No bruising, swelling, skin changes. No prior issues with this shoulder. Right handed. Motion has improved some. No night pain.  No past medical history on file.  Current Outpatient Prescriptions on File Prior to Visit  Medication Sig Dispense Refill  . meloxicam (MOBIC) 15 MG tablet Take 1 tablet (15 mg total) by mouth daily. 30 tablet 0  . Olopatadine HCl 0.2 % SOLN Apply 1 drop to eye daily. (Patient not taking: Reported on 01/08/2015) 2.5 mL 6   No current facility-administered medications on file prior to visit.    Past Surgical History  Procedure Laterality Date  . No past surgeries      No Known Allergies  Social History   Social History  . Marital Status: Single    Spouse Name: N/A  . Number of Children: N/A  . Years of Education: N/A   Occupational History  . Not on file.   Social History Main Topics  . Smoking status: Never Smoker   . Smokeless tobacco: Not on file  . Alcohol Use: No  . Drug Use: No  . Sexual Activity: Not on file   Other Topics Concern  . Not on file   Social History Narrative    No family history on file.  BP 150/88 mmHg  Pulse 77  Ht 5\' 11"  (1.803 m)  Wt 235 lb (106.595 kg)  BMI 32.79 kg/m2  Review of Systems: See HPI above.    Objective:  Physical Exam:  Gen: NAD  Right shoulder: No swelling, ecchymoses.  No gross deformity. No TTP. FROM with negative painful arc.Danne Baxter, Neers. Negative Speeds, Yergasons. Strength 5/5 with empty can and resisted internal/external rotation.  No  pain. Pain with apprehension. Negative sulcus. Positive o'briens. NV intact distally.  Left shoulder: FROM without pain.    Assessment & Plan:  1. Right shoulder injury - consistent with injury to labrum - contusion vs small tear.  He is improving some - will continue meloxicam prescribed by [facility].  Shown home exercises to do daily.  Consider physical therapy, nitro patches, injection, further imaging if not improving as expected over the next 5-6 weeks.  Would avoid overhead motions, weight lifting with arms overhead as much as possible in this time frame.

## 2015-01-29 ENCOUNTER — Encounter: Payer: Self-pay | Admitting: Family Medicine

## 2015-01-29 ENCOUNTER — Encounter: Payer: Self-pay | Admitting: General Practice

## 2015-01-29 ENCOUNTER — Ambulatory Visit (INDEPENDENT_AMBULATORY_CARE_PROVIDER_SITE_OTHER): Payer: PRIVATE HEALTH INSURANCE | Admitting: Family Medicine

## 2015-01-29 VITALS — BP 132/80 | HR 77 | Temp 98.0°F | Resp 16 | Ht 71.0 in | Wt 235.5 lb

## 2015-01-29 DIAGNOSIS — R1013 Epigastric pain: Secondary | ICD-10-CM | POA: Diagnosis not present

## 2015-01-29 LAB — HEPATIC FUNCTION PANEL
ALK PHOS: 61 U/L (ref 39–117)
ALT: 33 U/L (ref 0–53)
AST: 29 U/L (ref 0–37)
Albumin: 4.1 g/dL (ref 3.5–5.2)
BILIRUBIN TOTAL: 0.6 mg/dL (ref 0.2–1.2)
Bilirubin, Direct: 0.1 mg/dL (ref 0.0–0.3)
Total Protein: 7.2 g/dL (ref 6.0–8.3)

## 2015-01-29 LAB — CBC WITH DIFFERENTIAL/PLATELET
BASOS ABS: 0 10*3/uL (ref 0.0–0.1)
Basophils Relative: 0.5 % (ref 0.0–3.0)
Eosinophils Absolute: 0.1 10*3/uL (ref 0.0–0.7)
Eosinophils Relative: 1.5 % (ref 0.0–5.0)
HCT: 51.2 % (ref 39.0–52.0)
Hemoglobin: 17.3 g/dL — ABNORMAL HIGH (ref 13.0–17.0)
LYMPHS ABS: 1.7 10*3/uL (ref 0.7–4.0)
Lymphocytes Relative: 19.7 % (ref 12.0–46.0)
MCHC: 33.8 g/dL (ref 30.0–36.0)
MCV: 86.2 fl (ref 78.0–100.0)
MONO ABS: 0.4 10*3/uL (ref 0.1–1.0)
Monocytes Relative: 5.1 % (ref 3.0–12.0)
NEUTROS PCT: 73.2 % (ref 43.0–77.0)
Neutro Abs: 6.2 10*3/uL (ref 1.4–7.7)
Platelets: 186 10*3/uL (ref 150.0–400.0)
RBC: 5.94 Mil/uL — AB (ref 4.22–5.81)
RDW: 14.6 % (ref 11.5–15.5)
WBC: 8.5 10*3/uL (ref 4.0–10.5)

## 2015-01-29 LAB — LIPASE: LIPASE: 34 U/L (ref 11.0–59.0)

## 2015-01-29 LAB — H. PYLORI ANTIBODY, IGG: H PYLORI IGG: POSITIVE — AB

## 2015-01-29 LAB — AMYLASE: AMYLASE: 60 U/L (ref 27–131)

## 2015-01-29 MED ORDER — PANTOPRAZOLE SODIUM 40 MG PO TBEC: 40.0000 mg | DELAYED_RELEASE_TABLET | Freq: Every day | ORAL | Status: DC

## 2015-01-29 NOTE — Assessment & Plan Note (Signed)
New.  Suspect GERD based on pt's sxs and the fact that they improve w/ Pepcid.  Start PPI.  Reviewed lifestyle and dietary modifications for sx improvement.  Will also get labs to r/o H pylori, pancreatitis, or biliary cause for pain.  Reviewed supportive care and red flags that should prompt return.  Pt expressed understanding and is in agreement w/ plan.

## 2015-01-29 NOTE — Progress Notes (Signed)
Pre visit review using our clinic review tool, if applicable. No additional management support is needed unless otherwise documented below in the visit note. 

## 2015-01-29 NOTE — Progress Notes (Signed)
   Subjective:    Patient ID: William Leon, male    DOB: Aug 10, 1970, 44 y.o.   MRN: [id number]  HPI Abd pain- intermittent epigastric pain.  Improves w/ Pepcid.  Pt reports he will wake between 3-4am w/ epigastric pain, nausea, increased saliva in mouth.  sxs have been present for months.  Denies caffeine intake, ETOH- will occasionally eat spicy/acidic food.    Review of Systems For ROS see HPI     Objective:   Physical Exam  Constitutional: He is oriented to person, place, and time. He appears well-developed and well-nourished. No distress.  HENT:  Head: Normocephalic and atraumatic.  Cardiovascular: Normal rate, regular rhythm and normal heart sounds.   Pulmonary/Chest: Effort normal and breath sounds normal. No respiratory distress. He has no wheezes. He has no rales.  Abdominal: Soft. Bowel sounds are normal. He exhibits no distension. There is no tenderness. There is no rebound and no guarding.  Neurological: He is alert and oriented to person, place, and time.  Skin: Skin is warm and dry.  Psychiatric: He has a normal mood and affect. His behavior is normal.  Vitals reviewed.         Assessment & Plan:

## 2015-01-29 NOTE — Patient Instructions (Addendum)
Follow up as needed We'll notify you of your lab results and make any changes if needed Start the Protonix once daily Limit caffeine, alcohol, spicy or acidic foods until you are feeling better Take the medication for 1 month and then stop.  If symptoms return, restart the medication Call with any questions or concerns Hang in there!!!

## 2015-01-30 ENCOUNTER — Other Ambulatory Visit: Payer: Self-pay | Admitting: General Practice

## 2015-01-30 MED ORDER — METRONIDAZOLE 500 MG PO TABS: 500.0000 mg | ORAL_TABLET | Freq: Three times a day (TID) | ORAL | Status: DC

## 2015-01-30 MED ORDER — DOXYCYCLINE HYCLATE 100 MG PO TABS: 100.0000 mg | ORAL_TABLET | Freq: Two times a day (BID) | ORAL | Status: DC

## 2015-01-30 MED ORDER — BISMUTH SUBSALICYLATE 262 MG PO TABS: 2.0000 | ORAL_TABLET | Freq: Three times a day (TID) | ORAL | Status: DC

## 2015-07-21 ENCOUNTER — Encounter: Payer: Self-pay | Admitting: Family Medicine

## 2015-07-21 ENCOUNTER — Ambulatory Visit (INDEPENDENT_AMBULATORY_CARE_PROVIDER_SITE_OTHER): Payer: PRIVATE HEALTH INSURANCE | Admitting: Family Medicine

## 2015-07-21 VITALS — BP 118/70 | HR 90 | Temp 98.3°F | Ht 71.0 in | Wt 244.2 lb

## 2015-07-21 DIAGNOSIS — R590 Localized enlarged lymph nodes: Secondary | ICD-10-CM

## 2015-07-21 DIAGNOSIS — J029 Acute pharyngitis, unspecified: Secondary | ICD-10-CM | POA: Diagnosis not present

## 2015-07-21 DIAGNOSIS — R599 Enlarged lymph nodes, unspecified: Secondary | ICD-10-CM | POA: Diagnosis not present

## 2015-07-21 DIAGNOSIS — J020 Streptococcal pharyngitis: Secondary | ICD-10-CM

## 2015-07-21 LAB — POCT RAPID STREP A (OFFICE): Rapid Strep A Screen: POSITIVE — AB

## 2015-07-21 MED ORDER — PENICILLIN V POTASSIUM 500 MG PO TABS: 500.0000 mg | ORAL_TABLET | Freq: Two times a day (BID) | ORAL | Status: DC

## 2015-07-21 MED FILL — PENICILLIN VK 500 MG TABLET: 500 | 10 days supply | Qty: 20 | Fill #0

## 2015-07-21 NOTE — Progress Notes (Signed)
Pre visit review using our clinic review tool, if applicable. No additional management support is needed unless otherwise documented below in the visit note. 

## 2015-07-21 NOTE — Progress Notes (Signed)
[place] [facility] [address], [place], [place] [address] [phone]  Date:  07/21/2015   Name:  William Leon   DOB:  [birth year]   MRN:  [id number]  PCP:  Annye Asa, MD    Chief Complaint: Sore Throat   History of Present Illness:  William Leon is a 45 y.o. very pleasant male patient who presents with the following:  Here today with illness- he notes a left sided ST in the left side only. It has been present for about one week.   He has not noted any cough or fever. The left ear does hurt some  He does not notice any tooth pain No GI symptoms.   No meds used except ibuprofen His daughter had a ST and flu but that was about 3 weeks ago. She is now well  Never a smoker, married, one daughter who is 47 yo. Neither his wife or daughter are ill currently  He also states that he shaved his genital area and has noted some "very dry skin" there since. Declines to have me examine the area so I advised him that I cannot tell him what will help resolve his symptoms   Patient Active Problem List   Diagnosis Date Noted  . Abdominal pain, epigastric 01/29/2015  . Right shoulder injury 01/16/2015  . Left wrist pain 12/04/2014  . Eye redness 12/04/2014  . Hematuria 06/18/2014  . Penile trauma 06/18/2014  . Elevated serum creatinine 05/22/2014  . Elbow pain 02/13/2014  . Possible exposure to STD 02/08/2014  . Cerumen impaction 01/07/2013  . Injury of foot, left 08/04/2012  . Physical exam, annual 11/04/2011  . TESTICULAR MASS, LEFT 10/22/2009  . LENTIGO 10/01/2009  . MUSCLE SPASM, TRAPEZIUS 10/01/2009    No past medical history on file.  Past Surgical History  Procedure Laterality Date  . No past surgeries      Social History  Substance Use Topics  . Smoking status: Never Smoker   . Smokeless tobacco: None  . Alcohol Use: No    No family history on file.  No Known Allergies  Medication list has been reviewed  and updated.  Current Outpatient Prescriptions on File Prior to Visit  Medication Sig Dispense Refill  . Bismuth Subsalicylate 99991111 MG TABS Take 2 tablets (524 mg total) by mouth 3 (three) times daily. (Patient not taking: Reported on 07/21/2015) 84 each 0  . doxycycline (VIBRA-TABS) 100 MG tablet Take 1 tablet (100 mg total) by mouth 2 (two) times daily. (Patient not taking: Reported on 07/21/2015) 14 tablet 0  . meloxicam (MOBIC) 15 MG tablet Take 1 tablet (15 mg total) by mouth daily. (Patient not taking: Reported on 07/21/2015) 30 tablet 0  . metroNIDAZOLE (FLAGYL) 500 MG tablet Take 1 tablet (500 mg total) by mouth 3 (three) times daily with meals. (Patient not taking: Reported on 07/21/2015) 21 tablet 0  . pantoprazole (PROTONIX) 40 MG tablet Take 1 tablet (40 mg total) by mouth daily. (Patient not taking: Reported on 07/21/2015) 30 tablet 3   No current facility-administered medications on file prior to visit.    Review of Systems:  As per HPI- otherwise negative.   Physical Examination: Filed Vitals:   07/21/15 0918  BP: 118/70  Pulse: 90  Temp: 98.3 F (36.8 C)   Filed Vitals:   07/21/15 0918  Height: 5\' 11"  (1.803 m)  Weight: 244 lb 3.2 oz (110.768 kg)   Body mass  index is 34.07 kg/(m^2). Ideal Body Weight: Weight in (lb) to have BMI = 25: 178.9  GEN: WDWN, NAD, Non-toxic, A & O x 3 HEENT: Atraumatic, Normocephalic. Neck supple. No masses. Bilateral TM wnl, oropharynx inflamed but no exudate. Small left anterior cervical nodes.  PEERL,EOMI.   Ears and Nose: No external deformity. CV: RRR, No M/G/R. No JVD. No thrill. No extra heart sounds. PULM: CTA B, no wheezes, crackles, rhonchi. No retractions. No resp. distress. No accessory muscle use. ABD: S, NT, ND EXTR: No c/c/e NEURO Normal gait.  PSYCH: Normally interactive. Conversant. Not depressed or anxious appearing.  Calm demeanor.    Assessment and Plan: Strep pharyngitis - Plan: penicillin v potassium (VEETID) 500  MG tablet  Acute pharyngitis, unspecified etiology - Plan: POCT rapid strep A  Anterior cervical lymphadenopathy - Plan: POCT rapid strep A  Treat for strep throat with penicillin Advised rest He will let me know if not feeling better soon  Signed Lamar Blinks, MD

## 2015-07-21 NOTE — Patient Instructions (Signed)
You do have strep throat- take the penicillin as directed. Let me know if your symptoms do not go away in the next couple of days!

## 2015-08-08 ENCOUNTER — Telehealth: Payer: Self-pay | Admitting: Medical

## 2015-08-08 ENCOUNTER — Encounter: Payer: PRIVATE HEALTH INSURANCE | Admitting: Medical

## 2015-08-08 NOTE — Progress Notes (Signed)
This encounter was created in error - please disregard.

## 2015-08-11 ENCOUNTER — Ambulatory Visit (INDEPENDENT_AMBULATORY_CARE_PROVIDER_SITE_OTHER): Payer: PRIVATE HEALTH INSURANCE | Admitting: Medical

## 2015-08-11 ENCOUNTER — Encounter: Payer: Self-pay | Admitting: Medical

## 2015-08-11 VITALS — BP 122/80 | HR 70 | Temp 98.0°F | Ht 71.0 in | Wt 239.0 lb

## 2015-08-11 DIAGNOSIS — J309 Allergic rhinitis, unspecified: Secondary | ICD-10-CM

## 2015-08-11 DIAGNOSIS — R05 Cough: Secondary | ICD-10-CM

## 2015-08-11 DIAGNOSIS — R059 Cough, unspecified: Secondary | ICD-10-CM

## 2015-08-11 MED ORDER — BENZONATATE 100 MG PO CAPS: 100.0000 mg | ORAL_CAPSULE | Freq: Three times a day (TID) | ORAL | Status: DC | PRN

## 2015-08-11 MED ORDER — AZITHROMYCIN 250 MG PO TABS: ORAL_TABLET | ORAL | Status: DC

## 2015-08-11 MED ORDER — LEVOCETIRIZINE DIHYDROCHLORIDE 5 MG PO TABS: 5.0000 mg | ORAL_TABLET | Freq: Every evening | ORAL | Status: DC

## 2015-08-11 MED ORDER — FLUTICASONE PROPIONATE 50 MCG/ACT NA SUSP: 2.0000 | Freq: Every day | NASAL | Status: DC

## 2015-08-11 MED FILL — BENZONATATE 100 MG CAPSULE: 100 | 7 days supply | Qty: 21 | Fill #0

## 2015-08-11 MED FILL — LEVOCETIRIZINE 5 MG TABLET: 5 | 30 days supply | Qty: 30 | Fill #0

## 2015-08-11 MED FILL — FLUTICASONE PROP 50 MCG SPR: 50 | 30 days supply | Qty: 16 | Fill #0

## 2015-08-11 NOTE — Patient Instructions (Signed)
You appear to have allergic rhinitis now.  For allergies rx xyzal and flonase.  For cough rx benzonatate.   If your sinus pressure worsens or you are having more productive cough then rx azithromycin.  Follow up in 7 days or as needed.

## 2015-08-11 NOTE — Progress Notes (Signed)
Subjective:    Patient ID: William Leon, male    DOB: May 04, 1970, 45 y.o.   MRN: [id number]  HPI  Pt has some nasal congestion and some hoarse voice. Symptoms for 4-5 days.  Some sneezing and runny nose. No itchy eyes. But eyes are little red.   Pt speculates he may have allergies.   Pt has tried wifes claritin. He thinks it did hellp.  Pt thinks maybe sweating in his sleep 2 days ago. But he states typically he does sweat some easily.   Has dry cough. Rare productive. Pt thinks he may be getting better.   Review of Systems  Constitutional: Negative for fever, chills and fatigue.  HENT: Positive for congestion, postnasal drip and rhinorrhea. Negative for ear pain and sore throat.   Respiratory: Negative for cough, shortness of breath and wheezing.   Cardiovascular: Negative for chest pain and palpitations.  Musculoskeletal: Negative for back pain.  Neurological: Negative for dizziness and headaches.  Hematological: Negative for adenopathy. Does not bruise/bleed easily.  Psychiatric/Behavioral: Negative for behavioral problems and confusion.   No past medical history on file.   Social History   Social History  . Marital Status: Single    Spouse Name: N/A  . Number of Children: N/A  . Years of Education: N/A   Occupational History  . Not on file.   Social History Main Topics  . Smoking status: Never Smoker   . Smokeless tobacco: Not on file  . Alcohol Use: No  . Drug Use: No  . Sexual Activity: Not on file   Other Topics Concern  . Not on file   Social History Narrative    Past Surgical History  Procedure Laterality Date  . No past surgeries      No family history on file.  No Known Allergies  No current outpatient prescriptions on file prior to visit.   No current facility-administered medications on file prior to visit.    BP 122/80 mmHg  Pulse 70  Temp(Src) 98 F (36.7 C) (Oral)  Ht 5\' 11"  (1.803 m)  Wt 239 lb (108.41 kg)  BMI 33.35 kg/m2   SpO2 98%       Objective:   Physical Exam  General  Mental Status - Alert. General Appearance - Well groomed. Not in acute distress.  Skin Rashes- No Rashes.  HEENT Head- Normal. Ear Auditory Canal - Left- Normal. Right - Normal.Tympanic Membrane- Left- Normal. Right- Normal. Eye Sclera/Conjunctiva- Left- Normal. Right- Normal. Nose & Sinuses Nasal Mucosa- Left-  Boggy and Congested. Right-  Boggy and  Congested.Bilateral maxillary and  Faint frontal sinus pressure. Mouth & Throat Lips: Upper Lip- Normal: no dryness, cracking, pallor, cyanosis, or vesicular eruption. Lower Lip-Normal: no dryness, cracking, pallor, cyanosis or vesicular eruption. Buccal Mucosa- Bilateral- No Aphthous ulcers. Oropharynx- No Discharge or Erythema. +pnd Tonsils: Characteristics- Bilateral- No Erythema or Congestion. Size/Enlargement- Bilateral- No enlargement. Discharge- bilateral-None.  Neck Neck- Supple. No Masses.   Chest and Lung Exam Auscultation: Breath Sounds:-Clear even and unlabored.  Cardiovascular Auscultation:Rythm- Regular, rate and rhythm. Murmurs & Other Heart Sounds:Ausculatation of the heart reveal- No Murmurs.  Lymphatic Head & Neck General Head & Neck Lymphatics: Bilateral: Description- No Localized lymphadenopathy.       Assessment & Plan:  You appear to have allergic rhinitis now.  For allergies rx xyzal and flonase.  For cough rx benzonatate.   If your sinus pressure worsens or you are having more productive cough then rx azithromycin.  Follow up in  7 days or as needed.

## 2015-08-11 NOTE — Progress Notes (Signed)
Pre visit review using our clinic review tool, if applicable. No additional management support is needed unless otherwise documented below in the visit note. 

## 2015-08-13 ENCOUNTER — Encounter: Payer: Self-pay | Admitting: Medical

## 2015-08-13 NOTE — Telephone Encounter (Signed)
Waiving fee, mailing reminder letter °

## 2015-08-13 NOTE — Telephone Encounter (Signed)
No charge. 

## 2015-08-13 NOTE — Telephone Encounter (Signed)
Pt was no show 08/08/15 for acute appt, pt came in 08/11/15, 1st no show, charge or no charge?

## 2015-09-04 ENCOUNTER — Ambulatory Visit (INDEPENDENT_AMBULATORY_CARE_PROVIDER_SITE_OTHER): Payer: PRIVATE HEALTH INSURANCE | Admitting: Medical

## 2015-09-04 ENCOUNTER — Encounter: Payer: Self-pay | Admitting: Medical

## 2015-09-04 VITALS — BP 122/76 | HR 86 | Temp 98.1°F | Ht 71.0 in | Wt 246.8 lb

## 2015-09-04 DIAGNOSIS — J209 Acute bronchitis, unspecified: Secondary | ICD-10-CM

## 2015-09-04 DIAGNOSIS — J0100 Acute maxillary sinusitis, unspecified: Secondary | ICD-10-CM | POA: Diagnosis not present

## 2015-09-04 DIAGNOSIS — R05 Cough: Secondary | ICD-10-CM | POA: Diagnosis not present

## 2015-09-04 DIAGNOSIS — R059 Cough, unspecified: Secondary | ICD-10-CM

## 2015-09-04 MED ORDER — HYDROCODONE-HOMATROPINE 5-1.5 MG/5ML PO SYRP: 5.0000 mL | ORAL_SOLUTION | Freq: Three times a day (TID) | ORAL | Status: DC | PRN

## 2015-09-04 MED ORDER — FLUTICASONE PROPIONATE 50 MCG/ACT NA SUSP: 2.0000 | Freq: Every day | NASAL | Status: DC

## 2015-09-04 MED ORDER — AZITHROMYCIN 250 MG PO TABS: ORAL_TABLET | ORAL | Status: DC

## 2015-09-04 MED FILL — FLUTICASONE PROP 50 MCG SPR: 50 | 30 days supply | Qty: 16 | Fill #0

## 2015-09-04 MED FILL — HYDROCODONE-HOMATROPINE SYR: 5-1.5 | 8 days supply | Qty: 120 | Fill #0

## 2015-09-04 MED FILL — AZITHROMYCIN 250 MG TABLET: 250 | 5 days supply | Qty: 6 | Fill #0

## 2015-09-04 NOTE — Progress Notes (Signed)
Subjective:    Patient ID: William Leon, male    DOB: [birth year], 45 y.o.   MRN: [id number]  HPI  Pt in for 4 days of feeling sick. Pt was in [place] mentioned temperature changes recently. Very hot in [place].But also very cold in rooms at hotel.   Pt states cough is moderate to severe at times. Cough was severe and kept him up last night. Occasional productive.   Feels some sweats and chills. He thought may have had rattle in upper chest recently.  Pt thinks this is not allergy related.   Review of Systems  Constitutional: Positive for fever, chills and fatigue.       Fatigue today mild. Yesterday a lot.  HENT: Positive for congestion and sinus pressure. Negative for drooling, postnasal drip and sore throat.        No sinus pain but can feel pressure.  Respiratory: Positive for cough. Negative for chest tightness, shortness of breath and wheezing.   Cardiovascular: Negative for chest pain and palpitations.  Gastrointestinal: Negative for abdominal pain.  Genitourinary: Negative for dysuria and flank pain.  Musculoskeletal: Negative for back pain.  Neurological: Negative for dizziness and headaches.  Hematological: Negative for adenopathy. Does not bruise/bleed easily.    No past medical history on file.   Social History   Social History  . Marital Status: Single    Spouse Name: N/A  . Number of Children: N/A  . Years of Education: N/A   Occupational History  . Not on file.   Social History Main Topics  . Smoking status: Never Smoker   . Smokeless tobacco: Not on file  . Alcohol Use: No  . Drug Use: No  . Sexual Activity: Not on file   Other Topics Concern  . Not on file   Social History Narrative    Past Surgical History  Procedure Laterality Date  . No past surgeries      No family history on file.  No Known Allergies  Current Outpatient Prescriptions on File Prior to Visit  Medication Sig Dispense Refill  . levocetirizine (XYZAL) 5 MG tablet Take 1  tablet (5 mg total) by mouth every evening. 30 tablet 2   No current facility-administered medications on file prior to visit.    BP 122/76 mmHg  Pulse 86  Temp(Src) 98.1 F (36.7 C) (Oral)  Ht 5\' 11"  (1.803 m)  Wt 246 lb 12.8 oz (111.948 kg)  BMI 34.44 kg/m2  SpO2 97%       Objective:   Physical Exam  General  Mental Status - Alert. General Appearance - Well groomed. Not in acute distress.  Skin Rashes- No Rashes.  HEENT Head- Normal. Ear Auditory Canal - Left- Normal. Right - Normal.Tympanic Membrane- Left- Normal. Right- Normal. Eye Sclera/Conjunctiva- Left- Normal. Right- Normal. Nose & Sinuses Nasal Mucosa- Left-  Boggy and Congested. Right-  Boggy and  Congested.Bilateral maxillary and frontal sinus pressure.(but no pain on palpation of sinuses. Mouth & Throat Lips: Upper Lip- Normal: no dryness, cracking, pallor, cyanosis, or vesicular eruption. Lower Lip-Normal: no dryness, cracking, pallor, cyanosis or vesicular eruption. Buccal Mucosa- Bilateral- No Aphthous ulcers. Oropharynx- No Discharge or Erythema. +pnd Tonsils: Characteristics- Bilateral- No Erythema or Congestion. Size/Enlargement- Bilateral- No enlargement. Discharge- bilateral-None.  Neck Neck- Supple. No Masses.   Chest and Lung Exam Auscultation: Breath Sounds:-Clear even and unlabored.  Cardiovascular Auscultation:Rythm- Regular, rate and rhythm. Murmurs & Other Heart Sounds:Ausculatation of the heart reveal- No Murmurs.  Lymphatic Head & Neck General  Head & Neck Lymphatics: Bilateral: Description- No Localized lymphadenopathy.       Assessment & Plan:  You appear to have early bronchitis and sinusitis.(appproaching long 3 day weekend as well)  Rx azithromycin antibiotic.  For cough hycodan syrup.  Refill your flonase also for nasal congestion.  Follow up in 7 days or as needed   Velia Pamer, Percell Miller, [employer]

## 2015-09-04 NOTE — Patient Instructions (Signed)
You appear to have early bronchitis and sinusitis.(appproaching long 3 day weekend as well)  Rx azithromycin antibiotic.  For cough hycodan syrup.  Refill your flonase also for nasal congestion.  Follow up in 7 days or as needed

## 2015-09-04 NOTE — Progress Notes (Signed)
Pre visit review using our clinic review tool, if applicable. No additional management support is needed unless otherwise documented below in the visit note. 

## 2015-09-11 ENCOUNTER — Encounter: Payer: Self-pay | Admitting: Medical

## 2015-09-11 ENCOUNTER — Ambulatory Visit (INDEPENDENT_AMBULATORY_CARE_PROVIDER_SITE_OTHER): Payer: PRIVATE HEALTH INSURANCE | Admitting: Medical

## 2015-09-11 VITALS — BP 112/72 | HR 78 | Temp 98.1°F | Resp 16 | Ht 71.0 in | Wt 247.8 lb

## 2015-09-11 DIAGNOSIS — J40 Bronchitis, not specified as acute or chronic: Secondary | ICD-10-CM | POA: Diagnosis not present

## 2015-09-11 DIAGNOSIS — R05 Cough: Secondary | ICD-10-CM | POA: Diagnosis not present

## 2015-09-11 DIAGNOSIS — R062 Wheezing: Secondary | ICD-10-CM

## 2015-09-11 DIAGNOSIS — R059 Cough, unspecified: Secondary | ICD-10-CM

## 2015-09-11 MED ORDER — ALBUTEROL SULFATE HFA 108 (90 BASE) MCG/ACT IN AERS: 2.0000 | INHALATION_SPRAY | Freq: Four times a day (QID) | RESPIRATORY_TRACT | Status: DC | PRN

## 2015-09-11 MED ORDER — HYDROCODONE-HOMATROPINE 5-1.5 MG/5ML PO SYRP: 5.0000 mL | ORAL_SOLUTION | Freq: Three times a day (TID) | ORAL | Status: DC | PRN

## 2015-09-11 MED ORDER — PREDNISONE 10 MG PO TABS: ORAL_TABLET | ORAL | Status: DC

## 2015-09-11 MED FILL — HYDROCODONE-HOMATROPINE SYR: 5-1.5 | 8 days supply | Qty: 120 | Fill #0

## 2015-09-11 MED FILL — predniSONE 10 MG TABS: 10 | 4 days supply | Qty: 10 | Fill #0

## 2015-09-11 NOTE — Progress Notes (Signed)
Subjective:    Patient ID: William Leon, male    DOB: [birth year], 45 y.o.   MRN: [id number]  HPI   Pt in for follow up. Pt states cough is still present. Cough seems more at night. Despite use of hycodan. He thinks maybe wheeze sound at times. Maybe rattle sensation.  On last visit he started zpack, flonase and the hycodan.  Mild productive cough at times.   Review of Systems  Constitutional: Negative for fever, chills, diaphoresis and fatigue.  Respiratory: Positive for cough and wheezing. Negative for chest tightness and shortness of breath.        Maybe wheeze though he is not sure.  Cardiovascular: Negative for chest pain and palpitations.  Gastrointestinal: Negative for nausea, abdominal pain and diarrhea.  Musculoskeletal: Negative for back pain.  Neurological: Negative for dizziness and headaches.  Hematological: Negative for adenopathy. Does not bruise/bleed easily.    No past medical history on file.   Social History   Social History  . Marital Status: Single    Spouse Name: N/A  . Number of Children: N/A  . Years of Education: N/A   Occupational History  . Not on file.   Social History Main Topics  . Smoking status: Never Smoker   . Smokeless tobacco: Not on file  . Alcohol Use: No  . Drug Use: No  . Sexual Activity: Not on file   Other Topics Concern  . Not on file   Social History Narrative    Past Surgical History  Procedure Laterality Date  . No past surgeries      No family history on file.  No Known Allergies  Current Outpatient Prescriptions on File Prior to Visit  Medication Sig Dispense Refill  . levocetirizine (XYZAL) 5 MG tablet Take 1 tablet (5 mg total) by mouth every evening. 30 tablet 2   No current facility-administered medications on file prior to visit.    BP 112/72 mmHg  Pulse 78  Temp(Src) 98.1 F (36.7 C) (Oral)  Resp 16  Ht 5\' 11"  (1.803 m)  Wt 247 lb 12.8 oz (112.401 kg)  BMI 34.58 kg/m2  SpO2 97%         Objective:   Physical Exam  General  Mental Status - Alert. General Appearance - Well groomed. Not in acute distress.  Skin Rashes- No Rashes.  HEENT Head- Normal. Ear Auditory Canal - Left- Normal. Right - Normal.Tympanic Membrane- Left- Normal. Right- Normal. Eye Sclera/Conjunctiva- Left- Normal. Right- Normal. Nose & Sinuses Nasal Mucosa- Left-  Boggy and Congested. Right-  Boggy and  Congested.Bilateral no  maxillary and  No frontal sinus pressure. Mouth & Throat Lips: Upper Lip- Normal: no dryness, cracking, pallor, cyanosis, or vesicular eruption. Lower Lip-Normal: no dryness, cracking, pallor, cyanosis or vesicular eruption. Buccal Mucosa- Bilateral- No Aphthous ulcers. Oropharynx- No Discharge or Erythema. Tonsils: Characteristics- Bilateral- No Erythema or Congestion. Size/Enlargement- Bilateral- No enlargement. Discharge- bilateral-None.  Neck Neck- Supple. No Masses.   Chest and Lung Exam Auscultation: Breath Sounds:-even and unlabored. Faint occasional scattered.  Cardiovascular Auscultation:Rythm- Regular, rate and rhythm. Murmurs & Other Heart Sounds:Ausculatation of the heart reveal- No Murmurs.  Lymphatic Head & Neck General Head & Neck Lymphatics: Bilateral: Description- No Localized lymphadenopathy.       Assessment & Plan:  Your appear to have cough and some wheezing on exam following bronchitis.  Rx prednisone 4 day taper dose.   Refill  Hycodan.  Continue flonase.  Rx albuterol use if needed  cxr order  today. Can get today or on Monday if cough is  Persistent.  Follow up 7-10 days or as needed   Maddilyn Campus, Percell Miller, [employer]

## 2015-09-11 NOTE — Progress Notes (Signed)
Pre visit review using our clinic review tool, if applicable. No additional management support is needed unless otherwise documented below in the visit note. 

## 2015-09-11 NOTE — Patient Instructions (Signed)
Your appear to have cough and some wheezing on exam following bronchitis.  Rx prednisone 4 day taper dose.   Refill  Hycodan.  Continue flonase.  Rx albuterol use if needed  cxr order today. Can get today or on Monday if cough is  Persistent.  Follow up 7-10 days or as needed

## 2015-09-15 ENCOUNTER — Ambulatory Visit (HOSPITAL_BASED_OUTPATIENT_CLINIC_OR_DEPARTMENT_OTHER)
Admission: RE | Admit: 2015-09-15 | Discharge: 2015-09-15 | Disposition: A | Discharge status: Home / Self Care | Payer: PRIVATE HEALTH INSURANCE | Source: Ambulatory Visit | Attending: Medical | Admitting: Medical

## 2015-09-15 ENCOUNTER — Telehealth: Payer: Self-pay

## 2015-09-15 ENCOUNTER — Telehealth: Payer: Self-pay | Admitting: Medical

## 2015-09-15 DIAGNOSIS — R059 Cough, unspecified: Secondary | ICD-10-CM

## 2015-09-15 DIAGNOSIS — R05 Cough: Secondary | ICD-10-CM

## 2015-09-15 NOTE — Telephone Encounter (Signed)
Patient called to see if order forCXR was placed. Spoke with E. Saguier. Order placed notified patient that he could come for order today. Advised he woulf get call back once imaging has notified ES of results. Patient agreed to come in today for CXR.

## 2015-09-15 NOTE — Telephone Encounter (Signed)
Placed cxr today. Pt still has some cough.

## 2015-09-16 ENCOUNTER — Telehealth: Payer: Self-pay | Admitting: Medical

## 2015-09-16 DIAGNOSIS — R062 Wheezing: Secondary | ICD-10-CM

## 2015-09-16 DIAGNOSIS — J309 Allergic rhinitis, unspecified: Secondary | ICD-10-CM

## 2015-09-16 DIAGNOSIS — R05 Cough: Secondary | ICD-10-CM

## 2015-09-16 DIAGNOSIS — R059 Cough, unspecified: Secondary | ICD-10-CM

## 2015-09-16 MED FILL — VENTOLIN HFA 90 MCG INHALER: 108 (90 BAS | 21 days supply | Qty: 18 | Fill #0

## 2015-09-16 NOTE — Telephone Encounter (Signed)
Please see allergy referral.

## 2015-09-16 NOTE — Telephone Encounter (Signed)
Patient is going to see Dr Donneta Romberg on 09/19/15, pt aware

## 2015-09-16 NOTE — Telephone Encounter (Signed)
In Allergy and Asthma work que/awaiting appt

## 2015-09-16 NOTE — Telephone Encounter (Signed)
Opened to review 

## 2016-04-26 ENCOUNTER — Encounter: Payer: Self-pay | Admitting: Physician Assistant

## 2016-04-26 ENCOUNTER — Ambulatory Visit (INDEPENDENT_AMBULATORY_CARE_PROVIDER_SITE_OTHER): Payer: PRIVATE HEALTH INSURANCE | Admitting: Physician Assistant

## 2016-04-26 ENCOUNTER — Ambulatory Visit (HOSPITAL_BASED_OUTPATIENT_CLINIC_OR_DEPARTMENT_OTHER)
Admission: RE | Admit: 2016-04-26 | Discharge: 2016-04-26 | Disposition: A | Discharge status: Home / Self Care | Payer: PRIVATE HEALTH INSURANCE | Source: Ambulatory Visit | Attending: Physician Assistant | Admitting: Physician Assistant

## 2016-04-26 VITALS — BP 122/76 | HR 70 | Temp 98.2°F | Resp 18 | Ht 71.0 in | Wt 241.0 lb

## 2016-04-26 DIAGNOSIS — R1013 Epigastric pain: Secondary | ICD-10-CM | POA: Insufficient documentation

## 2016-04-26 DIAGNOSIS — Z23 Encounter for immunization: Secondary | ICD-10-CM

## 2016-04-26 LAB — CBC WITH DIFFERENTIAL/PLATELET
BASOS ABS: 0 10*3/uL (ref 0.0–0.1)
Basophils Relative: 0.6 % (ref 0.0–3.0)
EOS ABS: 0.3 10*3/uL (ref 0.0–0.7)
Eosinophils Relative: 3.7 % (ref 0.0–5.0)
HCT: 48.9 % (ref 39.0–52.0)
HEMOGLOBIN: 16.7 g/dL (ref 13.0–17.0)
Lymphocytes Relative: 24.3 % (ref 12.0–46.0)
Lymphs Abs: 2 10*3/uL (ref 0.7–4.0)
MCHC: 34.1 g/dL (ref 30.0–36.0)
MCV: 85.3 fl (ref 78.0–100.0)
MONO ABS: 0.6 10*3/uL (ref 0.1–1.0)
Monocytes Relative: 7.1 % (ref 3.0–12.0)
Neutro Abs: 5.4 10*3/uL (ref 1.4–7.7)
Neutrophils Relative %: 64.3 % (ref 43.0–77.0)
Platelets: 200 10*3/uL (ref 150.0–400.0)
RBC: 5.73 Mil/uL (ref 4.22–5.81)
RDW: 14.6 % (ref 11.5–15.5)
WBC: 8.3 10*3/uL (ref 4.0–10.5)

## 2016-04-26 LAB — HEPATIC FUNCTION PANEL
ALBUMIN: 4.2 g/dL (ref 3.5–5.2)
ALK PHOS: 50 U/L (ref 39–117)
ALT: 31 U/L (ref 0–53)
AST: 30 U/L (ref 0–37)
Bilirubin, Direct: 0.2 mg/dL (ref 0.0–0.3)
Total Bilirubin: 0.5 mg/dL (ref 0.2–1.2)
Total Protein: 7.2 g/dL (ref 6.0–8.3)

## 2016-04-26 LAB — BASIC METABOLIC PANEL
BUN: 18 mg/dL (ref 6–23)
CO2: 27 meq/L (ref 19–32)
Calcium: 9.5 mg/dL (ref 8.4–10.5)
Chloride: 104 mEq/L (ref 96–112)
Creatinine, Ser: 1.42 mg/dL (ref 0.40–1.50)
GFR: 68.7 mL/min (ref >60.00)
GLUCOSE: 104 mg/dL — AB (ref 70–99)
POTASSIUM: 3.7 meq/L (ref 3.5–5.1)
SODIUM: 137 meq/L (ref 135–145)

## 2016-04-26 LAB — LIPASE: Lipase: 29 U/L (ref 11.0–59.0)

## 2016-04-26 NOTE — Progress Notes (Signed)
Pre visit review using our clinic review tool, if applicable. No additional management support is needed unless otherwise documented below in the visit note. 

## 2016-04-26 NOTE — Addendum Note (Signed)
Addended by: Kelle Darting A on: 04/26/2016 03:09 PM   Modules accepted: Orders

## 2016-04-26 NOTE — Progress Notes (Addendum)
Subjective:    Patient ID: William Leon, male    DOB: 1971-02-26, 47 y.o.   MRN: [id number]  HPI  William Leon is a 46 y/o male who presents with 1-2 year history of intermittent abdominal pain. Pain most often starts after eating but has started while eating. He states that his symptoms have been relatively stable, however over the weekend he ate a lot of fried chicken and pizza and had worsening of pain, which is why he is here today. He states that he feels as though his symptoms are related to high fat foods. He also admits that he visited with two friends over the weekend who had similar symptoms and were diagnosed with gallstones, so he is worried that he has them. He is not taking anything for his symptoms. He denies diarrhea, constipation, weight loss, night sweats, nausea, vomiting, blood in stool, radiation of pain, change in appetite, alcohol intake, excessive NSAID or ASA use, fevers, chills, urinary symptoms. He also reports that sometimes his mouth fills with saliva when he gets this pain. He states that his pain is a 3/10. Last BM yesterday. Last PO intake early this morning, he states that he ate an orange.  Per chart review, he was seen last by Dr. Birdie Riddle for this in 01/2015 and was found to have H. Pylori. He tells me that he doesn't remember this. I cannot see any test of cure for H. Pylori in his chart.  Review of Systems  See HPI  No past medical history on file.   Social History   Social History  . Marital status: Single    Spouse name: N/A  . Number of children: N/A  . Years of education: N/A   Occupational History  . Not on file.   Social History Main Topics  . Smoking status: Never Smoker  . Smokeless tobacco: Not on file  . Alcohol use No  . Drug use: No  . Sexual activity: Not on file   Other Topics Concern  . Not on file   Social History Narrative  . No narrative on file    Past Surgical History:  Procedure Laterality Date  . NO PAST SURGERIES       No family history on file.  No Known Allergies  Current Outpatient Prescriptions on File Prior to Visit  Medication Sig Dispense Refill  . albuterol (PROVENTIL HFA;VENTOLIN HFA) 108 (90 Base) MCG/ACT inhaler Inhale 2 puffs into the lungs every 6 (six) hours as needed for wheezing or shortness of breath. (Patient not taking: Reported on 04/26/2016) 1 Inhaler 0  . HYDROcodone-homatropine (HYCODAN) 5-1.5 MG/5ML syrup Take 5 mLs by mouth every 8 (eight) hours as needed for cough. (Patient not taking: Reported on 04/26/2016) 120 mL 0  . levocetirizine (XYZAL) 5 MG tablet Take 1 tablet (5 mg total) by mouth every evening. (Patient not taking: Reported on 04/26/2016) 30 tablet 2  . predniSONE (DELTASONE) 10 MG tablet 4 tab po day 1, 3 tab po day 2, 2 tab po day 3, 1 tab po day 4. (Patient not taking: Reported on 04/26/2016) 10 tablet 0   No current facility-administered medications on file prior to visit.     BP 122/76 (BP Location: Right Arm, Cuff Size: Large)   Pulse 70   Temp 98.2 F (36.8 C) (Oral)   Resp 18   Ht 5\' 11"  (1.803 m)   Wt 241 lb (109.3 kg)   SpO2 98% Comment: room air  BMI 33.61 kg/m  Objective:   Physical Exam  Constitutional: He appears well-developed and well-nourished. He is cooperative.  Non-toxic appearance. He does not have a sickly appearance.  HENT:  Head: Normocephalic and atraumatic.  Eyes: Conjunctivae are normal.  Cardiovascular: Normal rate, regular rhythm and normal heart sounds.   Pulmonary/Chest: Effort normal and breath sounds normal.  Abdominal: Soft. Normal appearance. There is tenderness in the right upper quadrant and epigastric area. There is no rigidity, no rebound, no guarding, no CVA tenderness and negative Murphy's sign.  Neurological: He is alert.  Nursing note and vitals reviewed.      Assessment & Plan:  1. Abdominal pain, epigastric Patient currently asymptomatic and clinically stable. Will order routine abdominal ultrasound  as well as the following tests: CBC with Differential/Platelet, Basic metabolic panel, Hepatic function panel,  Lipase. I am unable to find any documentation/test of cure for H. Pylori, therefore I have ordered the breath test at this time. Recommended and reviewed bland diet with patient. Discouraged alcohol and excessive NSAID use. Advised patient to go to the ED if he develops  fever, severe pain, dizziness, dark/bloody stools - patient verbalized understanding and is agreeable to plan.  Patient is overdue for physical, recommended that he make an appointment to see PCP soon for this.  Addendum: U/S shows no gallstones or inflammation - I called patient and relayed this information to him and he verbalized understanding.  Inda Coke PA-C 04/26/16

## 2016-04-26 NOTE — Patient Instructions (Signed)
Please go to the first floor to complete your abdominal ultrasound at 2:30p. We will call you with the results. Please go to the lab to complete your labwork.  Follow a bland diet. If you develop any fever, severe pain, dizziness, dark/bloody stools, please go to the ER immediately.  Bland Diet Introduction A bland diet consists of foods that do not have a lot of fat or fiber. Foods without fat or fiber are easier for the body to digest. They are also less likely to irritate your mouth, throat, stomach, and other parts of your gastrointestinal tract. A bland diet is sometimes called a BRAT diet. What is my plan? Your health care provider or dietitian may recommend specific changes to your diet to prevent and treat your symptoms, such as:  Eating small meals often.  Cooking food until it is soft enough to chew easily.  Chewing your food well.  Drinking fluids slowly.  Not eating foods that are very spicy, sour, or fatty.  Not eating citrus fruits, such as oranges and grapefruit. What do I need to know about this diet?  Eat a variety of foods from the bland diet food list.  Do not follow a bland diet longer than you have to.  Ask your health care provider whether you should take vitamins. What foods can I eat? Grains  Hot cereals, such as cream of wheat. Bread, crackers, or tortillas made from refined white flour. Rice. Vegetables  Canned or cooked vegetables. Mashed or boiled potatoes. Fruits  Bananas. Applesauce. Other types of cooked or canned fruit with the skin and seeds removed, such as canned peaches or pears. Meats and Other Protein Sources  Scrambled eggs. Creamy peanut butter or other nut butters. Lean, well-cooked meats, such as chicken or fish. Tofu. Soups or broths. Dairy  Low-fat dairy products, such as milk, cottage cheese, or yogurt. Beverages  Water. Herbal tea. Apple juice. Sweets and Desserts  Pudding. Custard. Fruit gelatin. Ice cream. Fats and Oils  Mild  salad dressings. Canola or olive oil. The items listed above may not be a complete list of allowed foods or beverages. Contact your dietitian for more options.  What foods are not recommended? Foods and ingredients that are often not recommended include:  Spicy foods, such as hot sauce or salsa.  Fried foods.  Sour foods, such as pickled or fermented foods.  Raw vegetables or fruits, especially citrus or berries.  Caffeinated drinks.  Alcohol.  Strongly flavored seasonings or condiments. The items listed above may not be a complete list of foods and beverages that are not allowed. Contact your dietitian for more information.  This information is not intended to replace advice given to you by your health care provider. Make sure you discuss any questions you have with your health care provider. Document Released: 07/21/2015 Document Revised: 09/04/2015 Document Reviewed: 04/10/2014  2017 Elsevier

## 2016-04-27 ENCOUNTER — Other Ambulatory Visit: Payer: Self-pay | Admitting: Physician Assistant

## 2016-04-27 DIAGNOSIS — A048 Other specified bacterial intestinal infections: Secondary | ICD-10-CM

## 2016-04-27 LAB — H. PYLORI BREATH TEST: H. pylori Breath Test: DETECTED — AB

## 2016-04-27 MED ORDER — CLARITHROMYCIN 500 MG PO TABS: 500.0000 mg | ORAL_TABLET | Freq: Two times a day (BID) | ORAL | 0 refills | Status: AC

## 2016-04-27 MED ORDER — METRONIDAZOLE 500 MG PO TABS: 500.0000 mg | ORAL_TABLET | Freq: Three times a day (TID) | ORAL | 0 refills | Status: AC

## 2016-04-27 MED ORDER — PANTOPRAZOLE SODIUM 40 MG PO TBEC: 40.0000 mg | DELAYED_RELEASE_TABLET | Freq: Two times a day (BID) | ORAL | 0 refills | Status: DC

## 2016-04-27 MED FILL — metroNIDAZOLE 500 MG TABS: 500 | 14 days supply | Qty: 42 | Fill #0

## 2016-04-27 MED FILL — PANTOPRAZOLE SOD DR 40 MG T: 40 | 28 days supply | Qty: 28 | Fill #0

## 2016-04-27 MED FILL — CLARITHROMYCIN 500 MG TAB: 500 | 14 days supply | Qty: 28 | Fill #0

## 2016-04-30 ENCOUNTER — Telehealth: Payer: Self-pay | Admitting: Family Medicine

## 2016-04-30 NOTE — Telephone Encounter (Signed)
Patient is calling regarding his lab results. Patient states that the pharmacy called him stating that he has medications to pick up but he is not sure why. He is waiting for a return call or may call back. 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 Please advise   Phone: [phone]

## 2016-04-30 NOTE — Telephone Encounter (Signed)
Patient notified of results by [facility] and I.

## 2016-08-24 ENCOUNTER — Telehealth: Payer: Self-pay | Admitting: Family Medicine

## 2016-08-24 NOTE — Telephone Encounter (Signed)
Please advise, last CPE with [facility] was 01/2014.

## 2016-08-24 NOTE — Telephone Encounter (Signed)
Patient requesting to be worked in for cpe before 10/09/16.  He needs a cpe by this date per his job in order to qualify for lower premiums.  Please advise if ok to work patient in before this date.

## 2016-08-24 NOTE — Telephone Encounter (Signed)
You can place him on cody's schedule. [place] with both providers.

## 2016-08-24 NOTE — Telephone Encounter (Signed)
Ok to work in if slot available (there should be cancellations w/ pt's summer plans)

## 2016-08-30 NOTE — Telephone Encounter (Signed)
pcp just had a cancellation become available for a cpe on 09/07/16 at 10am.  Offered this appt to patient, he accepted.

## 2016-09-07 ENCOUNTER — Ambulatory Visit (INDEPENDENT_AMBULATORY_CARE_PROVIDER_SITE_OTHER): Payer: PRIVATE HEALTH INSURANCE | Admitting: Family Medicine

## 2016-09-07 ENCOUNTER — Encounter: Payer: Self-pay | Admitting: Family Medicine

## 2016-09-07 ENCOUNTER — Encounter: Payer: Self-pay | Admitting: General Practice

## 2016-09-07 VITALS — BP 120/78 | HR 84 | Temp 97.9°F | Resp 16 | Ht 71.0 in | Wt 244.1 lb

## 2016-09-07 DIAGNOSIS — Z0000 Encounter for general adult medical examination without abnormal findings: Secondary | ICD-10-CM

## 2016-09-07 LAB — CBC WITH DIFFERENTIAL/PLATELET
BASOS PCT: 0.6 % (ref 0.0–3.0)
Basophils Absolute: 0.1 10*3/uL (ref 0.0–0.1)
EOS ABS: 0.3 10*3/uL (ref 0.0–0.7)
EOS PCT: 4.3 % (ref 0.0–5.0)
HCT: 49.5 % (ref 39.0–52.0)
HEMOGLOBIN: 16.9 g/dL (ref 13.0–17.0)
LYMPHS ABS: 2 10*3/uL (ref 0.7–4.0)
Lymphocytes Relative: 25 % (ref 12.0–46.0)
MCHC: 34.1 g/dL (ref 30.0–36.0)
MCV: 87.2 fl (ref 78.0–100.0)
MONO ABS: 0.5 10*3/uL (ref 0.1–1.0)
Monocytes Relative: 6.9 % (ref 3.0–12.0)
NEUTROS ABS: 5 10*3/uL (ref 1.4–7.7)
Neutrophils Relative %: 63.2 % (ref 43.0–77.0)
PLATELETS: 193 10*3/uL (ref 150.0–400.0)
RBC: 5.67 Mil/uL (ref 4.22–5.81)
RDW: 14.5 % (ref 11.5–15.5)
WBC: 7.9 10*3/uL (ref 4.0–10.5)

## 2016-09-07 LAB — BASIC METABOLIC PANEL
BUN: 22 mg/dL (ref 6–23)
CHLORIDE: 105 meq/L (ref 96–112)
CO2: 25 meq/L (ref 19–32)
CREATININE: 1.69 mg/dL — AB (ref 0.40–1.50)
Calcium: 9.7 mg/dL (ref 8.4–10.5)
GFR: 56.11 mL/min — ABNORMAL LOW (ref >60.00)
Glucose, Bld: 88 mg/dL (ref 70–99)
POTASSIUM: 3.9 meq/L (ref 3.5–5.1)
SODIUM: 138 meq/L (ref 135–145)

## 2016-09-07 LAB — LIPID PANEL
CHOL/HDL RATIO: 6
Cholesterol: 210 mg/dL — ABNORMAL HIGH (ref 0–200)
HDL: 32.8 mg/dL — ABNORMAL LOW (ref >39.00)
NONHDL: 177.14
TRIGLYCERIDES: 336 mg/dL — AB (ref 0.0–149.0)
VLDL: 67.2 mg/dL — ABNORMAL HIGH (ref 0.0–40.0)

## 2016-09-07 LAB — HEPATIC FUNCTION PANEL
ALK PHOS: 57 U/L (ref 39–117)
ALT: 40 U/L (ref 0–53)
AST: 32 U/L (ref 0–37)
Albumin: 4.3 g/dL (ref 3.5–5.2)
BILIRUBIN DIRECT: 0.1 mg/dL (ref 0.0–0.3)
BILIRUBIN TOTAL: 0.5 mg/dL (ref 0.2–1.2)
Total Protein: 7.2 g/dL (ref 6.0–8.3)

## 2016-09-07 LAB — PSA: PSA: 0.2 ng/mL (ref 0.10–4.00)

## 2016-09-07 LAB — LDL CHOLESTEROL, DIRECT: LDL DIRECT: 138 mg/dL

## 2016-09-07 LAB — TSH: TSH: 2.06 u[IU]/mL (ref 0.35–4.50)

## 2016-09-07 NOTE — Assessment & Plan Note (Signed)
Pt's PE WNL.  UTD on Tdap.  BMI is misleading as pt has excessive muscle mass.  Check labs.  Anticipatory guidance provided.

## 2016-09-07 NOTE — Progress Notes (Signed)
Pre visit review using our clinic review tool, if applicable. No additional management support is needed unless otherwise documented below in the visit note. 

## 2016-09-07 NOTE — Patient Instructions (Signed)
Follow up in 1 year or as needed We'll notify you of your lab results and make any changes if needed Continue to work on healthy diet and regular exercise- you look great! Call with any questions or concerns Have a great summer!! 

## 2016-09-07 NOTE — Progress Notes (Signed)
   Subjective:    Patient ID: William Leon, male    DOB: December 09, 1970, 46 y.o.   MRN: [id number]  HPI CPE- no concerns today, exercising regularly.  UTD on Tdap.   Review of Systems Patient reports no vision/hearing changes, anorexia, fever ,adenopathy, persistant/recurrent hoarseness, swallowing issues, chest pain, palpitations, edema, persistant/recurrent cough, hemoptysis, dyspnea (rest,exertional, paroxysmal nocturnal), gastrointestinal  bleeding (melena, rectal bleeding), abdominal pain, excessive heart burn, GU symptoms (dysuria, hematuria, voiding/incontinence issues) syncope, focal weakness, memory loss, numbness & tingling, skin/hair/nail changes, depression, anxiety, abnormal bruising/bleeding, musculoskeletal symptoms/signs.     Objective:   Physical Exam General Appearance:    Alert, cooperative, no distress, appears stated age  Head:    Normocephalic, without obvious abnormality, atraumatic  Eyes:    PERRL, conjunctiva/corneas clear, EOM's intact, fundi    benign, both eyes       Ears:    Normal TM's and external ear canals, both ears  Nose:   Nares normal, septum midline, mucosa normal, no drainage   or sinus tenderness  Throat:   Lips, mucosa, and tongue normal; teeth and gums normal  Neck:   Supple, symmetrical, trachea midline, no adenopathy;       thyroid:  No enlargement/tenderness/nodules  Back:     Symmetric, no curvature, ROM normal, no CVA tenderness  Lungs:     Clear to auscultation bilaterally, respirations unlabored  Chest wall:    No tenderness or deformity  Heart:    Regular rate and rhythm, S1 and S2 normal, no murmur, rub   or gallop  Abdomen:     Soft, non-tender, bowel sounds active all four quadrants,    no masses, no organomegaly  Genitalia:    Normal male without lesion, masses,discharge or tenderness  Rectal:    Deferred due to young age  Extremities:   Extremities normal, atraumatic, no cyanosis or edema  Pulses:   2+ and symmetric all extremities    Skin:   Skin color, texture, turgor normal, no rashes or lesions  Lymph nodes:   Cervical, supraclavicular, and axillary nodes normal  Neurologic:   CNII-XII intact. Normal strength, sensation and reflexes      throughout          Assessment & Plan:

## 2016-09-08 ENCOUNTER — Encounter: Payer: Self-pay | Admitting: General Practice

## 2017-01-10 HISTORY — PX: ACHILLES TENDON SURGERY: SHX542

## 2017-02-07 ENCOUNTER — Encounter: Payer: Self-pay | Admitting: Medical

## 2017-02-07 ENCOUNTER — Ambulatory Visit (INDEPENDENT_AMBULATORY_CARE_PROVIDER_SITE_OTHER): Payer: PRIVATE HEALTH INSURANCE | Admitting: Family Medicine

## 2017-02-07 ENCOUNTER — Ambulatory Visit (HOSPITAL_BASED_OUTPATIENT_CLINIC_OR_DEPARTMENT_OTHER)
Admission: RE | Admit: 2017-02-07 | Discharge: 2017-02-07 | Disposition: A | Discharge status: Home / Self Care | Payer: PRIVATE HEALTH INSURANCE | Source: Ambulatory Visit | Attending: Medical | Admitting: Medical

## 2017-02-07 ENCOUNTER — Encounter: Payer: Self-pay | Admitting: Family Medicine

## 2017-02-07 ENCOUNTER — Ambulatory Visit (INDEPENDENT_AMBULATORY_CARE_PROVIDER_SITE_OTHER): Payer: PRIVATE HEALTH INSURANCE | Admitting: Medical

## 2017-02-07 VITALS — BP 134/68 | HR 78 | Temp 98.5°F | Resp 16 | Ht 71.0 in | Wt 249.0 lb

## 2017-02-07 DIAGNOSIS — M7661 Achilles tendinitis, right leg: Secondary | ICD-10-CM | POA: Insufficient documentation

## 2017-02-07 DIAGNOSIS — Z23 Encounter for immunization: Secondary | ICD-10-CM

## 2017-02-07 DIAGNOSIS — S86011A Strain of right Achilles tendon, initial encounter: Secondary | ICD-10-CM

## 2017-02-07 DIAGNOSIS — M25571 Pain in right ankle and joints of right foot: Secondary | ICD-10-CM

## 2017-02-07 DIAGNOSIS — M7660 Achilles tendinitis, unspecified leg: Secondary | ICD-10-CM | POA: Diagnosis not present

## 2017-02-07 DIAGNOSIS — S86011D Strain of right Achilles tendon, subsequent encounter: Secondary | ICD-10-CM | POA: Insufficient documentation

## 2017-02-07 NOTE — Patient Instructions (Signed)
You have a ruptured right achilles tendon - the tear is 4cm from the heel bone and the tear is only retracted about 1 cm at this time. Wear splint at all times. Use crutches or knee scooter to help get around. You will need to see an orthopedic surgeon for this and have this repaired, the sooner the better. Let us know which group you need to be referred to. Elevation as needed for swelling. Tylenol and/or ibuprofen as needed for pain.

## 2017-02-07 NOTE — Patient Instructions (Addendum)
I did place order of ankle for you to do today. This will check bone for fracture and may give secondary info on soft tissue.  I got you in With sports med Dr. Barbaraann Barthel today at 11:15 today on 3rd floor.  He will prescribe meds and determine what level of achilles tendon injury you may have suffered.  Follow up here as needed.  Flu vaccine given today.

## 2017-02-07 NOTE — Progress Notes (Signed)
PCP: Midge Minium, MD  Subjective:   HPI: Patient is a 46 y.o. male here for right ankle injury.  Patient reports on 10/25 he was playing in a volleyball tournament [place]. He pushed off with right foot to get the ball and felt a sharp pop with pain posterior right ankle. Difficulty bearing weight after this. Pain level now 5/10, sharp, worse trying to walk. Has been icing, elevating, wearing splint and using crutches. No prior injuries. No skin changes, numbness.  No past medical history on file.  No current outpatient prescriptions on file prior to visit.   No current facility-administered medications on file prior to visit.     Past Surgical History:  Procedure Laterality Date  . NO PAST SURGERIES      No Known Allergies  Social History   Social History  . Marital status: Single    Spouse name: N/A  . Number of children: N/A  . Years of education: N/A   Occupational History  . Not on file.   Social History Main Topics  . Smoking status: Never Smoker  . Smokeless tobacco: Never Used  . Alcohol use No  . Drug use: No  . Sexual activity: Not on file   Other Topics Concern  . Not on file   Social History Narrative  . No narrative on file    No family history on file.  BP 122/66   Pulse 80   Ht 5\' 11"  (1.803 m)   Wt 249 lb (112.9 kg)   BMI 34.73 kg/m   Review of Systems: See HPI above.     Objective:  Physical Exam:  Gen: NAD, comfortable in exam room  Right ankle: Swelling but no bruising circumferentially.  Defect palpated in achilles a few cm proximal to insertion on calcaneus. FROM but no strength with plantarflexion.  5/5 with other ankle motions. TTP mildly in defect of achilles and proximal, distal to this.  No other tenderness. Negative ant drawer and talar tilt.   Negative syndesmotic compression. Thompsons test positive. NV intact distally.  Left ankle: No gross deformity, swelling, ecchymoses FROM with 5/5  strength. No TTP Thompsons test negative. NV intact distally.  MSK u/s right ankle:  Achilles tendon rupture confirmed 4cm proximal to insertion point on calcaneus.  Proximal and distal ends of achilles are separated by 1.1cm.  Images saved, printed for patient.   Assessment & Plan:  1. Right achilles tendon rupture - confirmed with ultrasound.  Only 1 cm retraction.  Splinted in plantarflexion.  Script for knee scooter provided.  Tylenol and/or ibuprofen if needed.  Refer to ortho for repair.  We discussed what surgery, recovery would entail.

## 2017-02-07 NOTE — Progress Notes (Signed)
   Subjective:    Patient ID: William Leon, male    DOB: 1971-03-20, 46 y.o.   MRN: [id number]  HPI  Pt in for evaluation.   Pt states he twisted his ankle. He had popping sensation in achilles tendon area. He has severe swelling. Evaluated by cruise medical provider. They thought he would likely need surgery. Pt describes that clinically they thought he would likely need surgery for achilles injury but no xray done.  Pt states when he was playing volley ball felt and heard the pop when jumping to get ball. Area swelled very quickly.    Injury this past Thursday.   Review of Systems  Constitutional: Negative for chills, fatigue and fever.  Respiratory: Negative for cough, chest tightness, shortness of breath and wheezing.   Cardiovascular: Negative for chest pain and palpitations.  Gastrointestinal: Negative for abdominal pain, blood in stool, constipation, diarrhea and vomiting.  Musculoskeletal: Negative for back pain, myalgias and neck stiffness.       Ankle and achilles heel pain.  Skin: Negative for rash.  Neurological: Negative for dizziness, speech difficulty, weakness, light-headedness and numbness.  Hematological: Negative for adenopathy. Does not bruise/bleed easily.  Psychiatric/Behavioral: Negative for agitation, confusion and decreased concentration.   No past medical history on file.   Social History   Social History  . Marital status: Single    Spouse name: N/A  . Number of children: N/A  . Years of education: N/A   Occupational History  . Not on file.   Social History Main Topics  . Smoking status: Never Smoker  . Smokeless tobacco: Never Used  . Alcohol use No  . Drug use: No  . Sexual activity: Not on file   Other Topics Concern  . Not on file   Social History Narrative  . No narrative on file    Past Surgical History:  Procedure Laterality Date  . NO PAST SURGERIES      No family history on file.  No Known Allergies  No current  outpatient prescriptions on file prior to visit.   No current facility-administered medications on file prior to visit.     BP 134/68   Pulse 78   Temp 98.5 F (36.9 C) (Oral)   Resp 16   Ht 5\' 11"  (1.803 m)   Wt 249 lb (112.9 kg)   SpO2 98%   BMI 34.73 kg/m       Objective:   Physical Exam   General- No acute distress. Pleasant patient.    Rt ankle- mild swelling. On palpation possible defect on achilles tendon palpation. Rt lower ext- calf not swollen. Negative homan sign. Pulse intact- no bruising seen. (removed splint in order to examine and so he can get xray and exam by sports med.)     Assessment & Plan:  I did place order of ankle for you to do today. This will check bone for fracture and may give secondary info on soft tissue.  I got you in With sports med Dr. Barbaraann Barthel today at 11:15 today on 3rd floor.  He will prescribe meds and determine what level of achilles tendon injury you may have suffered.  Follow up here as needed.  Flu vaccine given today.

## 2017-02-07 NOTE — Assessment & Plan Note (Signed)
confirmed with ultrasound.  Only 1 cm retraction.  Splinted in plantarflexion.  Script for knee scooter provided.  Tylenol and/or ibuprofen if needed.  Refer to ortho for repair.  We discussed what surgery, recovery would entail.

## 2017-02-10 ENCOUNTER — Ambulatory Visit (INDEPENDENT_AMBULATORY_CARE_PROVIDER_SITE_OTHER): Payer: PRIVATE HEALTH INSURANCE | Admitting: Family Medicine

## 2017-02-10 ENCOUNTER — Telehealth: Payer: Self-pay | Admitting: Family Medicine

## 2017-02-10 ENCOUNTER — Encounter: Payer: Self-pay | Admitting: Family Medicine

## 2017-02-10 ENCOUNTER — Ambulatory Visit: Payer: PRIVATE HEALTH INSURANCE

## 2017-02-10 VITALS — BP 170/79 | HR 86 | Ht 71.0 in | Wt 240.0 lb

## 2017-02-10 DIAGNOSIS — I824Z9 Acute embolism and thrombosis of unspecified deep veins of unspecified distal lower extremity: Secondary | ICD-10-CM | POA: Insufficient documentation

## 2017-02-10 DIAGNOSIS — S86011A Strain of right Achilles tendon, initial encounter: Secondary | ICD-10-CM

## 2017-02-10 DIAGNOSIS — S86011D Strain of right Achilles tendon, subsequent encounter: Secondary | ICD-10-CM

## 2017-02-10 DIAGNOSIS — I824Z1 Acute embolism and thrombosis of unspecified deep veins of right distal lower extremity: Secondary | ICD-10-CM

## 2017-02-10 MED FILL — ENOXAPARIN 150 MG/ML SYR: 150 | 15 days supply | Qty: 30 | Fill #0

## 2017-02-10 NOTE — Telephone Encounter (Signed)
I've not worked with them before to know how they do their postop care.  Would they be providing a rehab program, how to advance his casting, when to transition to a boot?  Does he return to them at all for follow-up visits?  Or are they looking for an orthopedic group to provide all the postoperative care?

## 2017-02-10 NOTE — Telephone Encounter (Signed)
That is typical as the achilles rupture bleeds and swells over several days.  Has he found out about his insurance and [place] referring him somewhere?  If he feels the pain is increasing in his calf or there's any redness above the splint, I'd like to take a look at him to make sure he isn't getting a blood clot.

## 2017-02-10 NOTE — Progress Notes (Signed)
PCP: Midge Minium, MD  Subjective:   HPI: Patient is a 46 y.o. male here for right ankle injury.  10/29: Patient reports on 10/25 he was playing in a volleyball tournament [place] a cruise [place]. He pushed off with right foot to get the ball and felt a sharp pop with pain posterior right ankle. Difficulty bearing weight after this. Pain level now 5/10, sharp, worse trying to walk. Has been icing, elevating, wearing splint and using crutches. No prior injuries. No skin changes, numbness.  11/1: Patient returns noting increase in pain over the past day. He reports he did have a fall when he went to the right side and struck outside of his right lower leg on something but unsure if related to current pain. States increased swelling, tight feeling into calf now with 8-9/10level of pain, sharp. Brought in to evaluate, consider doppler depending on exam.  No past medical history on file.  No current outpatient prescriptions on file prior to visit.   No current facility-administered medications on file prior to visit.     Past Surgical History:  Procedure Laterality Date  . NO PAST SURGERIES      No Known Allergies  Social History   Social History  . Marital status: Single    Spouse name: N/A  . Number of children: N/A  . Years of education: N/A   Occupational History  . Not on file.   Social History Main Topics  . Smoking status: Never Smoker  . Smokeless tobacco: Never Used  . Alcohol use No  . Drug use: No  . Sexual activity: Not on file   Other Topics Concern  . Not on file   Social History Narrative  . No narrative on file    No family history on file.  BP (!) 170/79   Pulse 86   Ht 5\' 11"  (1.803 m)   Wt 240 lb (108.9 kg)   BMI 33.47 kg/m   Review of Systems: See HPI above.     Objective:  Physical Exam:  Gen: NAD, comfortable in exam room.  Right ankle: Splint removed. Increased swelling and mild warmth around calf.  No bruising.   Swelling into ankle and foot as well. TTP medial, lateral gastroc and centrally as well. Defect again noted in achilles tendon. Thompsons test positive. NV intact distally.  MSK u/s right ankle:  Achilles tendon rupture again noted 4 cm proximal to insertion on calcaneus.  Proximal and distal ends of achilles are separated by 1.2cm.  Images saved.   Assessment & Plan:  1. Right achilles tendon rupture - confirmed with ultrasound.  Only 1.2 cm retraction.  Plantarflexion splint, knee scooter.  Tylenol and/or ibuprofen if needed.  Ortho referral for repair.  2. Right distal DVT - nonocclusive DVT noted in peroneal vein.  Trauma, immobility risk factors.  He is symptomatic and with the immobility required by splinting I recommended treatment.  Given plan is for achilles tendon repair I advised we go ahead with lovenox and discussed this with our pharmacy.  He is on their schedule for November 15th.  This would give them the opportunity to stop the medication in preparation for surgery.  Then he could start oral anticoagulants after surgery through for full 3 months.

## 2017-02-10 NOTE — Telephone Encounter (Signed)
I spoke with the patient and Bre from [facility]. The patient is not looking to fly back and forth so he is looking for an office in [place] that will provide all post op care. They stated he would need staples removed, recasting, and would want him to start physical therapy possibly 6wks after.

## 2017-02-10 NOTE — Assessment & Plan Note (Signed)
confirmed with ultrasound.  Only 1.2 cm retraction.  Plantarflexion splint, knee scooter.  Tylenol and/or ibuprofen if needed.  Ortho referral for repair.

## 2017-02-10 NOTE — Telephone Encounter (Signed)
Patient is going to [place] in [place] to receive surgery. Patient and [facility] wants to know if there is an office here that can provide him follow up care, such as removing staples, new casts, etc.

## 2017-02-10 NOTE — Telephone Encounter (Signed)
Spoke to patient and gave him information provided by the physician. Patient stated that the pain has increased in his calf. Scheduled appointment.

## 2017-02-10 NOTE — Telephone Encounter (Signed)
Patient states he noticed swelling last night in his right ankle and up his calf.  Patient is asking if this is normal or if he needs to come in for a visit

## 2017-02-10 NOTE — Telephone Encounter (Signed)
So he would need to see an orthopedic office that would be ok with that arrangement and I suspect none of them would be as they wouldn't make money with the global surgical charge including 90 days of postop care.  Nevin Bloodgood - could you call [facility] and ask them if they would be willing to do this (patient is going to have surgery in [facility] and would need postoperative care here).  Patient said he called [facility] and they said they've done this before.

## 2017-02-10 NOTE — Assessment & Plan Note (Signed)
nonocclusive DVT noted in peroneal vein.  Trauma, immobility risk factors.  He is symptomatic and with the immobility required by splinting I recommended treatment.  Given plan is for achilles tendon repair I advised we go ahead with lovenox and discussed this with our pharmacy.  He is on their schedule for November 15th.  This would give them the opportunity to stop the medication in preparation for surgery.  Then he could start oral anticoagulants after surgery through for full 3 months.

## 2017-02-11 ENCOUNTER — Other Ambulatory Visit (HOSPITAL_BASED_OUTPATIENT_CLINIC_OR_DEPARTMENT_OTHER): Payer: Self-pay | Admitting: Pharmacy Technician

## 2017-02-11 MED ORDER — HYDROCODONE-ACETAMINOPHEN 5-325 MG PO TABS: 1.0000 | ORAL_TABLET | ORAL | 0 refills | Status: DC | PRN

## 2017-02-11 MED ORDER — ENOXAPARIN SODIUM 150 MG/ML ~~LOC~~ SOLN: 1.0000 mg/kg | Freq: Two times a day (BID) | SUBCUTANEOUS | 0 refills | Status: DC

## 2017-02-11 MED FILL — HYDROCODON-APAP 5-325: 5-325 | 5 days supply | Qty: 30 | Fill #0

## 2017-02-11 NOTE — Addendum Note (Signed)
Addended by: Dene Gentry on: 02/11/2017 10:33 AM   Modules accepted: Orders

## 2017-02-11 NOTE — Addendum Note (Signed)
Addended by: Dene Gentry on: 02/11/2017 11:57 AM   Modules accepted: Orders

## 2017-02-15 NOTE — Telephone Encounter (Signed)
Patient has been set up with Dr. Sharol Given at [facility] for postop care

## 2017-02-16 ENCOUNTER — Telehealth: Payer: Self-pay | Admitting: Family Medicine

## 2017-02-16 NOTE — Telephone Encounter (Signed)
Patient was informed. He plans to stop by today between 1:30 and 4:00 to have you take a look just to make sure everything is ok

## 2017-02-16 NOTE — Telephone Encounter (Signed)
As long as he's taking the medication as directed and hasn't missed any doses this should be ok.  If he wants me to take a look at it I'd be happy to have him swing by.  It's possible he has a hematoma also from striking this leg on something - given he's on a blood thinner it will swell more than usual.

## 2017-02-16 NOTE — Telephone Encounter (Signed)
Patient states he noticed two days ago swelling in the front of his calf. He has been taking the medication as prescribed and says he has elevated his leg to see if that would decrease the swelling, but it is still swollen today.

## 2017-02-23 ENCOUNTER — Telehealth: Payer: Self-pay | Admitting: Family Medicine

## 2017-02-23 MED ORDER — RIVAROXABAN (XARELTO) VTE STARTER PACK (15 & 20 MG): ORAL_TABLET | ORAL | 0 refills | Status: DC

## 2017-02-23 MED FILL — XARELTO STARTER PACK: 15 & 20 | 30 days supply | Qty: 51 | Fill #0

## 2017-02-23 NOTE — Telephone Encounter (Signed)
Spoke to patient and gave him information. Patient wanted to know when he will stop the shot and start taking blood thinner in pill form.

## 2017-02-23 NOTE — Telephone Encounter (Signed)
I would recommend he come see me for an office visit soon after he gets back and we'll review in more detail.  Does he have enough injections to get through to Monday?

## 2017-02-23 NOTE — Telephone Encounter (Signed)
Sent xarelto downstairs for him to start the day after surgery.  Again stress he takes his last shot tomorrow morning in preparation for his surgery on the 16th.  Then he starts the xarelto the day after his surgery (no more shots!).  Thanks!

## 2017-02-23 NOTE — Telephone Encounter (Signed)
Ok so that wouldn't be enough to get him in to see me.  In that case I would have him start the xarelto starter pack the day after his surgery.  He would take it twice a day for 21 days then once a day after this.  Does he want me to [facility]?    So he would still do the injection through tomorrow morning but start the pills after surgery instead of doing injections after surgery.

## 2017-02-23 NOTE — Telephone Encounter (Signed)
Spoke to patient and gave him information. Patient stated that he does his shot between 7-745.  flight leaves at 530 in the morning and will reach one stop between 630 and 700. Will he be okay to wait until he reaches his first stop?

## 2017-02-23 NOTE — Telephone Encounter (Signed)
Yes that's fine. Thanks  

## 2017-02-23 NOTE — Telephone Encounter (Signed)
Spoke to patient and gave him information provided by the physician and also contacted pharmacy about cost of medication. Patient was going to contact pharmacy.

## 2017-02-23 NOTE — Telephone Encounter (Signed)
Spoke to patient and told him that prescription was sent downstairs. Also gave him information provided by the physician.

## 2017-02-23 NOTE — Telephone Encounter (Signed)
His last dose should be in the morning the day before his surgery.  So if his surgery is on the 16th (if I remember right) his last dose should be on the 15th in the morning.    I would recommend he restart it the morning after his surgery unless the surgeon gives him other instructions.

## 2017-02-23 NOTE — Telephone Encounter (Signed)
Patient wants to know when is the last day he can 'take his blood thinning shot' (?)  Please call, thanks

## 2017-02-23 NOTE — Telephone Encounter (Signed)
Spoke to patient and he has one for tonight, tomorrow morning, would start back on Saturday for last dose. Will be back on Tuesday.

## 2017-02-24 ENCOUNTER — Telehealth (INDEPENDENT_AMBULATORY_CARE_PROVIDER_SITE_OTHER): Payer: Self-pay | Admitting: Orthopedic Surgery

## 2017-02-24 NOTE — Telephone Encounter (Signed)
William Leon  01-20-2071   Please call [facility])  [phone] [facility]  [place] [phone]   Please call to discuss surgery  Pt is sched for post op 03/07/2017  @10 :15am  Post op achillies tendon rupture repair

## 2017-02-24 NOTE — Telephone Encounter (Signed)
I received a VM William Leon and advised that Dr. Sharol Given would not have a problem seeing patient for follow up. Would need to fax [place] our office notes and operative note to [phone]. Or have patient bring with him to his appointment.

## 2017-03-02 ENCOUNTER — Telehealth (INDEPENDENT_AMBULATORY_CARE_PROVIDER_SITE_OTHER): Payer: Self-pay | Admitting: Radiology

## 2017-03-02 ENCOUNTER — Telehealth: Payer: Self-pay | Admitting: *Deleted

## 2017-03-02 NOTE — Telephone Encounter (Signed)
Eustace Moore called again from Dr. Efrain Sella office, notifying us that no operative note has been generated for surgery on 02/25/17. They will not be providing any office notes, or any other patient information because they will unfortunately not have it in time. He states that this is a standard procedure and staples are typically removed 10 days post operatively, and patient is placed in nonweightbearing cast. Advised that we have given their office ample time to get this information to our office. We have never seen him before and requesting this information is not unreasonable. I asked him to please fax operative note and I still have a difficult time comprehending how it will still not be ready by 26th when patient will be seen. I did advise this is ridiculous and not very good patient care. Advised you are asking another surgeon to resume care by a surgery done by an out of state provider who may do these completely different. We will see patient on Monday and Dr. Sharol Given can assess.

## 2017-03-07 ENCOUNTER — Ambulatory Visit (INDEPENDENT_AMBULATORY_CARE_PROVIDER_SITE_OTHER): Payer: Self-pay | Admitting: Orthopedic Surgery

## 2017-03-23 ENCOUNTER — Other Ambulatory Visit: Payer: Self-pay | Admitting: Family Medicine

## 2017-03-23 ENCOUNTER — Telehealth: Payer: Self-pay | Admitting: Family Medicine

## 2017-03-23 MED ORDER — RIVAROXABAN 20 MG PO TABS: 20.0000 mg | ORAL_TABLET | Freq: Every day | ORAL | 1 refills | Status: DC

## 2017-03-23 MED FILL — XARELTO 20 MG TABLET: 20 | 30 days supply | Qty: 30 | Fill #0

## 2017-03-23 NOTE — Telephone Encounter (Signed)
Patient calling regarding refill of blood thinner medication. States he is on his last pill.   Patient also states he was receiving injections for his blood clot prior to surgery and wants to know if these need to continue and if so when he should come in for the injection.

## 2017-03-23 NOTE — Telephone Encounter (Signed)
He should no longer be using injections since he's on the pills.  He will need to continue the pills once daily until February 1st.  I can send more in - does he want [facility] [address] or downstairs to our pharmacy?

## 2017-03-23 NOTE — Telephone Encounter (Signed)
Ok thanks - sent it downstairs for him.

## 2017-03-23 NOTE — Telephone Encounter (Signed)
Patient was informed. He would like to use our pharmacy downstairs

## 2017-04-07 ENCOUNTER — Ambulatory Visit: Payer: PRIVATE HEALTH INSURANCE | Admitting: Family Medicine

## 2017-04-08 ENCOUNTER — Ambulatory Visit: Payer: PRIVATE HEALTH INSURANCE | Admitting: Family Medicine

## 2017-04-08 ENCOUNTER — Encounter: Payer: Self-pay | Admitting: Family Medicine

## 2017-04-08 VITALS — BP 153/73 | HR 90 | Temp 98.9°F | Resp 16 | Ht 72.0 in | Wt 249.6 lb

## 2017-04-08 DIAGNOSIS — S39011A Strain of muscle, fascia and tendon of abdomen, initial encounter: Secondary | ICD-10-CM | POA: Diagnosis not present

## 2017-04-08 NOTE — Patient Instructions (Addendum)
Heat (pad or rice pillow in microwave) over affected area, 10-15 minutes every 2-3 hours while awake.   OK to take Tylenol 1000 mg (2 extra strength tabs) or 975 mg (3 regular strength tabs) every 6 hours as needed.  OK to use Mobic, not before working out.  Stretch the area.   Listen to your body.

## 2017-04-08 NOTE — Progress Notes (Signed)
Musculoskeletal Exam  Patient: William Leon DOB: [birth year]  DOS: 04/08/2017  SUBJECTIVE:  Chief Complaint:   Chief Complaint  Patient presents with  . Flank Pain    Pt has pain on right side     William Leon is a 46 y.o.  male for evaluation and treatment of his side pain.   Onset:  2 weeks ago. Pulling sheet.  Location: R lower Character:  aching and sharp  Progression of issue:  is getting better Associated symptoms: none No current pain, worse when he coughs, sneezes, palpates, or turns a certain way. Denies bowel/bladder incontinence or weakness Treatment: to date has been OTC NSAIDS.   Neurovascular symptoms: no  ROS: Gastrointestinal: no bowel incontinence Genitourinary: No bladder incontinence Musculoskeletal/Extremities: +back pain Neurologic: no numbness, tingling no weakness   History reviewed. No pertinent past medical history. Past Surgical History:  Procedure Laterality Date  . NO PAST SURGERIES      Objective:  VITAL SIGNS: BP (!) 153/73   Pulse 90   Temp 98.9 F (37.2 C) (Oral)   Resp 16   Ht 6' (1.829 m)   Wt 249 lb 9.6 oz (113.2 kg)   SpO2 100%   BMI 33.85 kg/m  Constitutional: Well formed, well developed. No acute distress. Thorax & Lungs:  No access msc use Skin: Warm. Dry. No erythema. No rash.  Musculoskeletal: low back.   Normal active range of motion: yes.   Normal passive range of motion: yes Tenderness to palpation: yes- over R cephalad portion of oblique Deformity: no Ecchymosis: no Neurologic: Normal sensory function.  Psychiatric: Normal mood. Age appropriate judgment and insight. Alert & oriented x 3.    Assessment:  Strain of abdominal wall, initial encounter  Plan: Tylenol, heat, ice, stretch, OK to return to activity as tolerated.  F/u prn.  The patient voiced understanding and agreement to the plan.   [place], DO 04/08/17  7:57 AM

## 2017-04-13 ENCOUNTER — Telehealth: Payer: Self-pay | Admitting: *Deleted

## 2017-04-13 NOTE — Telephone Encounter (Signed)
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    Patient is aware that he will need to contact them to see about getting that temporary tag.   Copied from [place] [id number]. Topic: Inquiry >> Apr 13, 2017 12:49 PM Scherrie Gerlach wrote: Reason for CRM: pt would like a temporary handicap.  Pt states he had surgery and is on crutches. Would like to pick up asap.

## 2017-04-15 ENCOUNTER — Ambulatory Visit: Payer: PRIVATE HEALTH INSURANCE | Admitting: Medical

## 2017-04-15 ENCOUNTER — Telehealth: Payer: Self-pay | Admitting: Family Medicine

## 2017-04-15 NOTE — Telephone Encounter (Signed)
Ok to write him for a 2 month handicap placard - thanks!

## 2017-04-15 NOTE — Telephone Encounter (Signed)
Isn't he following up with an orthopedic surgeon for post-operative care?  I don't mind doing it but I'd be surprised if they didn't give him one.

## 2017-04-15 NOTE — Telephone Encounter (Signed)
Patient called requesting a temporary handicap placard  ° °

## 2017-04-15 NOTE — Telephone Encounter (Signed)
Formed ready to be picked up.

## 2017-04-15 NOTE — Telephone Encounter (Signed)
Patient's post-op surgeon has not given him one. Patient would prefer if you could provide him with the placard

## 2017-04-22 MED FILL — XARELTO 20 MG TABLET: 20 | 30 days supply | Qty: 30 | Fill #1

## 2017-06-03 ENCOUNTER — Ambulatory Visit: Payer: PRIVATE HEALTH INSURANCE | Admitting: Medical

## 2017-08-08 ENCOUNTER — Ambulatory Visit: Payer: PRIVATE HEALTH INSURANCE | Admitting: Family Medicine

## 2017-08-08 ENCOUNTER — Encounter: Payer: Self-pay | Admitting: Family Medicine

## 2017-08-08 VITALS — BP 108/68 | HR 72 | Temp 98.7°F | Ht 71.0 in | Wt 247.4 lb

## 2017-08-08 DIAGNOSIS — H6123 Impacted cerumen, bilateral: Secondary | ICD-10-CM | POA: Diagnosis not present

## 2017-08-08 NOTE — Patient Instructions (Addendum)
OK to use Debrox (peroxide) in the ear to loosen up wax. Also recommend using a bulb syringe (for removing boogers from baby's noses) to flush through warm water. Do not use Q-tips as this can impact wax further.  Let us know if you need anything.  Earwax Buildup, Adult The ears produce a substance called earwax that helps keep bacteria out of the ear and protects the skin in the ear canal. Occasionally, earwax can build up in the ear and cause discomfort or hearing loss. What increases the risk? This condition is more likely to develop in people who:  Are male.  Are elderly.  Naturally produce more earwax.  Clean their ears often with cotton swabs.  Use earplugs often.  Use in-ear headphones often.  Wear hearing aids.  Have narrow ear canals.  Have earwax that is overly thick or sticky.  Have eczema.  Are dehydrated.  Have excess hair in the ear canal.  What are the signs or symptoms? Symptoms of this condition include:  Reduced or muffled hearing.  A feeling of fullness in the ear or feeling that the ear is plugged.  Fluid coming from the ear.  Ear pain.  Ear itch.  Ringing in the ear.  Coughing.  An obvious piece of earwax that can be seen inside the ear canal.  How is this diagnosed? This condition may be diagnosed based on:  Your symptoms.  Your medical history.  An ear exam. During the exam, your health care provider will look into your ear with an instrument called an otoscope.  You may have tests, including a hearing test. How is this treated? This condition may be treated by:  Using ear drops to soften the earwax.  Having the earwax removed by a health care provider. The health care provider may: ? Flush the ear with water. ? Use an instrument that has a loop on the end (curette). ? Use a suction device.  Surgery to remove the wax buildup. This may be done in severe cases.  Follow these instructions at home:  Take over-the-counter  and prescription medicines only as told by your health care provider.  Do not put any objects, including cotton swabs, into your ear. You can clean the opening of your ear canal with a washcloth or facial tissue.  Follow instructions from your health care provider about cleaning your ears. Do not over-clean your ears.  Drink enough fluid to keep your urine clear or pale yellow. This will help to thin the earwax.  Keep all follow-up visits as told by your health care provider. If earwax builds up in your ears often or if you use hearing aids, consider seeing your health care provider for routine, preventive ear cleanings. Ask your health care provider how often you should schedule your cleanings.  If you have hearing aids, clean them according to instructions from the manufacturer and your health care provider. Contact a health care provider if:  You have ear pain.  You develop a fever.  You have blood, pus, or other fluid coming from your ear.  You have hearing loss.  You have ringing in your ears that does not go away.  Your symptoms do not improve with treatment.  You feel like the room is spinning (vertigo). Summary  Earwax can build up in the ear and cause discomfort or hearing loss.  The most common symptoms of this condition include reduced or muffled hearing and a feeling of fullness in the ear or feeling that  the ear is plugged.  This condition may be diagnosed based on your symptoms, your medical history, and an ear exam.  This condition may be treated by using ear drops to soften the earwax or by having the earwax removed by a health care provider.  Do not put any objects, including cotton swabs, into your ear. You can clean the opening of your ear canal with a washcloth or facial tissue. This information is not intended to replace advice given to you by your health care provider. Make sure you discuss any questions you have with your health care provider. Document  Released: 05/06/2004 Document Revised: 06/09/2016 Document Reviewed: 06/09/2016 Elsevier Interactive Patient Education  [employer].

## 2017-08-08 NOTE — Progress Notes (Signed)
Chief Complaint  Patient presents with  . Cerumen Impaction    Pt is here for right ear fullness. Duration: 4 days Progression: unchanged Associated symptoms: n Denies: sore throat, congestion and fevers, bleeding, or discharge from ear Treatment to date:   ROS:  HEENT: +ear pain Costitutional: Denies fevers  History reviewed. No pertinent past medical history. History reviewed. No pertinent family history. Past Surgical History:  Procedure Laterality Date  . NO PAST SURGERIES      BP 108/68 (BP Location: Left Arm, Patient Position: Sitting, Cuff Size: Large)   Pulse 72   Temp 98.7 F (37.1 C) (Oral)   Ht 5\' 11"  (1.803 m)   Wt 247 lb 6 oz (112.2 kg)   SpO2 96%   BMI 34.50 kg/m  General: Awake, alert, appearing stated age HEENT:  L ear- Initially canal 100% obstructed with cerumen; after removal canal patent without drainage or erythema, TM is neg after removal R ear- Initially canal 100% obstructed with cerumen; after removal canal patent without drainage or erythema, TM is neg after removal Lungs: Normal effort, no accessory muscle use Psych: Age appropriate judgment and insight, normal mood and affect  Procedure note: Cerumen removal Verbal consent obtained. A clear curette was then attached light source was used to remove a large chunk of wax from his right ear. This was repeated on the left side.  A moderate amount of cerumen was removed.  There was still some left and he was having pain with instrumentation.  Irrigation was unsuccessful and I tried again with success this time.  No cerumen remaining. The patient tolerated this well overall with no immediate complications noted.  Bilateral impacted cerumen  Successful removal, avs instructions provided for self care. F/u prn. Pt voiced understanding and agreement to the plan.  [place], DO 08/08/17 1:35 PM

## 2017-09-08 ENCOUNTER — Ambulatory Visit (INDEPENDENT_AMBULATORY_CARE_PROVIDER_SITE_OTHER): Payer: PRIVATE HEALTH INSURANCE | Admitting: Physician Assistant

## 2017-09-08 ENCOUNTER — Other Ambulatory Visit: Payer: Self-pay

## 2017-09-08 ENCOUNTER — Encounter: Payer: Self-pay | Admitting: Physician Assistant

## 2017-09-08 VITALS — BP 112/80 | HR 71 | Temp 98.1°F | Resp 14 | Ht 72.0 in | Wt 245.0 lb

## 2017-09-08 DIAGNOSIS — Z125 Encounter for screening for malignant neoplasm of prostate: Secondary | ICD-10-CM | POA: Diagnosis not present

## 2017-09-08 DIAGNOSIS — Z0000 Encounter for general adult medical examination without abnormal findings: Secondary | ICD-10-CM

## 2017-09-08 DIAGNOSIS — M722 Plantar fascial fibromatosis: Secondary | ICD-10-CM | POA: Diagnosis not present

## 2017-09-08 LAB — CBC WITH DIFFERENTIAL/PLATELET
BASOS PCT: 0.6 % (ref 0.0–3.0)
Basophils Absolute: 0 10*3/uL (ref 0.0–0.1)
EOS PCT: 3 % (ref 0.0–5.0)
Eosinophils Absolute: 0.2 10*3/uL (ref 0.0–0.7)
HEMATOCRIT: 50.8 % (ref 39.0–52.0)
HEMOGLOBIN: 16.9 g/dL (ref 13.0–17.0)
LYMPHS PCT: 25.1 % (ref 12.0–46.0)
Lymphs Abs: 1.6 10*3/uL (ref 0.7–4.0)
MCHC: 33.2 g/dL (ref 30.0–36.0)
MCV: 86.8 fl (ref 78.0–100.0)
Monocytes Absolute: 0.5 10*3/uL (ref 0.1–1.0)
Monocytes Relative: 7.7 % (ref 3.0–12.0)
Neutro Abs: 4.1 10*3/uL (ref 1.4–7.7)
Neutrophils Relative %: 63.6 % (ref 43.0–77.0)
Platelets: 179 10*3/uL (ref 150.0–400.0)
RBC: 5.86 Mil/uL — ABNORMAL HIGH (ref 4.22–5.81)
RDW: 15.3 % (ref 11.5–15.5)
WBC: 6.5 10*3/uL (ref 4.0–10.5)

## 2017-09-08 LAB — LIPID PANEL
CHOLESTEROL: 192 mg/dL (ref 0–200)
HDL: 35 mg/dL — AB (ref >39.00)
LDL Cholesterol: 123 mg/dL — ABNORMAL HIGH (ref 0–99)
NONHDL: 157.29
TRIGLYCERIDES: 172 mg/dL — AB (ref 0.0–149.0)
Total CHOL/HDL Ratio: 5
VLDL: 34.4 mg/dL (ref 0.0–40.0)

## 2017-09-08 LAB — COMPREHENSIVE METABOLIC PANEL
ALK PHOS: 54 U/L (ref 39–117)
ALT: 25 U/L (ref 0–53)
AST: 22 U/L (ref 0–37)
Albumin: 4.3 g/dL (ref 3.5–5.2)
BILIRUBIN TOTAL: 0.6 mg/dL (ref 0.2–1.2)
BUN: 14 mg/dL (ref 6–23)
CALCIUM: 9.8 mg/dL (ref 8.4–10.5)
CO2: 29 mEq/L (ref 19–32)
Chloride: 102 mEq/L (ref 96–112)
Creatinine, Ser: 1.49 mg/dL (ref 0.40–1.50)
GFR: 64.61 mL/min (ref >60.00)
GLUCOSE: 104 mg/dL — AB (ref 70–99)
Potassium: 4.1 mEq/L (ref 3.5–5.1)
Sodium: 139 mEq/L (ref 135–145)
TOTAL PROTEIN: 7.3 g/dL (ref 6.0–8.3)

## 2017-09-08 LAB — HEMOGLOBIN A1C: Hgb A1c MFr Bld: 5.9 % (ref 4.6–6.5)

## 2017-09-08 LAB — PSA: PSA: 0.23 ng/mL (ref 0.10–4.00)

## 2017-09-08 NOTE — Patient Instructions (Signed)
Please go to the lab for blood work.   Our office will call you with your results unless you have chosen to receive results via MyChart.  If your blood work is normal we will follow-up each year for physicals and as scheduled for chronic medical problems.  If anything is abnormal we will treat accordingly and get you in for a follow-up.   For the plantar fasciitis, wear supportive foot wear. Do the cold can exercises as directed. Start a turmeric supplement (500 mg). Follow the recommended exercises below. Call me if not improving.    Plantar Fasciitis Rehab Ask your health care provider which exercises are safe for you. Do exercises exactly as told by your health care provider and adjust them as directed. It is normal to feel mild stretching, pulling, tightness, or discomfort as you do these exercises, but you should stop right away if you feel sudden pain or your pain gets worse. Do not begin these exercises until told by your health care provider. Stretching and range of motion exercises These exercises warm up your muscles and joints and improve the movement and flexibility of your foot. These exercises also help to relieve pain. Exercise A: Plantar fascia stretch  1. Sit with your left / right leg crossed over your opposite knee. 2. Hold your heel with one hand with that thumb near your arch. With your other hand, hold your toes and gently pull them back toward the top of your foot. You should feel a stretch on the bottom of your toes or your foot or both. 3. Hold this stretch for__________ seconds. 4. Slowly release your toes and return to the starting position. Repeat __________ times. Complete this exercise __________ times a day. Exercise B: Gastroc, standing  1. Stand with your hands against a wall. 2. Extend your left / right leg behind you, and bend your front knee slightly. 3. Keeping your heels on the floor and keeping your back knee straight, shift your weight toward the  wall without arching your back. You should feel a gentle stretch in your left / right calf. 4. Hold this position for __________ seconds. Repeat __________ times. Complete this exercise __________ times a day. Exercise C: Soleus, standing 1. Stand with your hands against a wall. 2. Extend your left / right leg behind you, and bend your front knee slightly. 3. Keeping your heels on the floor, bend your back knee and slightly shift your weight over the back leg. You should feel a gentle stretch deep in your calf. 4. Hold this position for __________ seconds. Repeat __________ times. Complete this exercise __________ times a day. Exercise D: Gastrocsoleus, standing 1. Stand with the ball of your left / right foot on a step. The ball of your foot is on the walking surface, right under your toes. 2. Keep your other foot firmly on the same step. 3. Hold onto the wall or a railing for balance. 4. Slowly lift your other foot, allowing your body weight to press your heel down over the edge of the step. You should feel a stretch in your left / right calf. 5. Hold this position for __________ seconds. 6. Return both feet to the step. 7. Repeat this exercise with a slight bend in your left / right knee. Repeat __________ times with your left / right knee straight and __________ times with your left / right knee bent. Complete this exercise __________ times a day. Balance exercise This exercise builds your balance and strength control  of your arch to help take pressure off your plantar fascia. Exercise E: Single leg stand 1. Without shoes, stand near a railing or in a doorway. You may hold onto the railing or door frame as needed. 2. Stand on your left / right foot. Keep your big toe down on the floor and try to keep your arch lifted. Do not let your foot roll inward. 3. Hold this position for __________ seconds. 4. If this exercise is too easy, you can try it with your eyes closed or while standing on a  pillow. Repeat __________ times. Complete this exercise __________ times a day. This information is not intended to replace advice given to you by your health care provider. Make sure you discuss any questions you have with your health care provider. Document Released: 03/29/2005 Document Revised: 12/02/2015 Document Reviewed: 02/10/2015 Elsevier Interactive Patient Education  2018 [place] Years, Male Preventive care refers to lifestyle choices and visits with your health care provider that can promote health and wellness. What does preventive care include?  A yearly physical exam. This is also called an annual well check.  Dental exams once or twice a year.  Routine eye exams. Ask your health care provider how often you should have your eyes checked.  Personal lifestyle choices, including: ? Daily care of your teeth and gums. ? Regular physical activity. ? Eating a healthy diet. ? Avoiding tobacco and drug use. ? Limiting alcohol use. ? Practicing safe sex. ? Taking low-dose aspirin every day starting at age 66. What happens during an annual well check? The services and screenings done by your health care provider during your annual well check will depend on your age, overall health, lifestyle risk factors, and family history of disease. Counseling Your health care provider may ask you questions about your:  Alcohol use.  Tobacco use.  Drug use.  Emotional well-being.  Home and relationship well-being.  Sexual activity.  Eating habits.  Work and work Statistician.  Screening You may have the following tests or measurements:  Height, weight, and BMI.  Blood pressure.  Lipid and cholesterol levels. These may be checked every 5 years, or more frequently if you are over 19 years old.  Skin check.  Lung cancer screening. You may have this screening every year starting at age 88 if you have a 30-pack-year history of smoking and  currently smoke or have quit within the past 15 years.  Fecal occult blood test (FOBT) of the stool. You may have this test every year starting at age 22.  Flexible sigmoidoscopy or colonoscopy. You may have a sigmoidoscopy every 5 years or a colonoscopy every 10 years starting at age 45.  Prostate cancer screening. Recommendations will vary depending on your family history and other risks.  Hepatitis C blood test.  Hepatitis B blood test.  Sexually transmitted disease (STD) testing.  Diabetes screening. This is done by checking your blood sugar (glucose) after you have not eaten for a while (fasting). You may have this done every 1-3 years.  Discuss your test results, treatment options, and if necessary, the need for more tests with your health care provider. Vaccines Your health care provider may recommend certain vaccines, such as:  Influenza vaccine. This is recommended every year.  Tetanus, diphtheria, and acellular pertussis (Tdap, Td) vaccine. You may need a Td booster every 10 years.  Varicella vaccine. You may need this if you have not been vaccinated.  Zoster vaccine. You may  need this after age 14.  Measles, mumps, and rubella (MMR) vaccine. You may need at least one dose of MMR if you were born in 1957 or later. You may also need a second dose.  Pneumococcal 13-valent conjugate (PCV13) vaccine. You may need this if you have certain conditions and have not been vaccinated.  Pneumococcal polysaccharide (PPSV23) vaccine. You may need one or two doses if you smoke cigarettes or if you have certain conditions.  Meningococcal vaccine. You may need this if you have certain conditions.  Hepatitis A vaccine. You may need this if you have certain conditions or if you travel or work in places where you may be exposed to hepatitis A.  Hepatitis B vaccine. You may need this if you have certain conditions or if you travel or work in places where you may be exposed to hepatitis  B.  Haemophilus influenzae type b (Hib) vaccine. You may need this if you have certain risk factors.  Talk to your health care provider about which screenings and vaccines you need and how often you need them. This information is not intended to replace advice given to you by your health care provider. Make sure you discuss any questions you have with your health care provider. Document Released: 04/25/2015 Document Revised: 12/17/2015 Document Reviewed: 01/28/2015 Elsevier Interactive Patient Education  [employer].

## 2017-09-08 NOTE — Progress Notes (Signed)
Patient presents to clinic today for annual exam.  Patient is fasting for labs.  Acute Concerns:  Also endorses some aching pain in left heel with prolonged ambulation. Also notes pain with first step in the morning or after prolonged rest. Denies any redness or swelling. Denies trauma or injury. Denies numbness, tingling. Wears work boots throughout the day. Has not taken anything for symptoms..   Health Maintenance: Immunizations -- up-to-date.   History reviewed. No pertinent past medical history.  Past Surgical History:  Procedure Laterality Date  . ACHILLES TENDON SURGERY Right 01/2017  . NO PAST SURGERIES      No current outpatient medications on file prior to visit.   No current facility-administered medications on file prior to visit.     No Known Allergies  History reviewed. No pertinent family history.  Social History   Socioeconomic History  . Marital status: Single    Spouse name: Not on file  . Number of children: Not on file  . Years of education: Not on file  . Highest education level: Not on file  Occupational History  . Not on file  Social Needs  . Financial resource strain: Not on file  . Food insecurity:    Worry: Not on file    Inability: Not on file  . Transportation needs:    Medical: Not on file    Non-medical: Not on file  Tobacco Use  . Smoking status: Never Smoker  . Smokeless tobacco: Never Used  Substance and Sexual Activity  . Alcohol use: No    Alcohol/week: 0.0 oz  . Drug use: No  . Sexual activity: Not on file  Lifestyle  . Physical activity:    Days per week: Not on file    Minutes per session: Not on file  . Stress: Not on file  Relationships  . Social connections:    Talks on phone: Not on file    Gets together: Not on file    Attends religious service: Not on file    Active member of club or organization: Not on file    Attends meetings of clubs or organizations: Not on file    Relationship status: Not on file    . Intimate partner violence:    Fear of current or ex partner: Not on file    Emotionally abused: Not on file    Physically abused: Not on file    Forced sexual activity: Not on file  Other Topics Concern  . Not on file  Social History Narrative  . Not on file   Review of Systems  Constitutional: Negative for fever and weight loss.  HENT: Negative for ear discharge, ear pain, hearing loss and tinnitus.   Eyes: Negative for blurred vision, double vision, photophobia and pain.  Respiratory: Negative for cough and shortness of breath.   Cardiovascular: Negative for chest pain and palpitations.  Gastrointestinal: Negative for abdominal pain, blood in stool, constipation, diarrhea, heartburn, melena, nausea and vomiting.  Genitourinary: Negative for dysuria, flank pain, frequency, hematuria and urgency.  Musculoskeletal: Positive for joint pain. Negative for falls.  Neurological: Negative for dizziness, loss of consciousness and headaches.  Endo/Heme/Allergies: Negative for environmental allergies.  Psychiatric/Behavioral: Negative for depression, hallucinations, substance abuse and suicidal ideas. The patient is not nervous/anxious and does not have insomnia.    BP 112/80   Pulse 71   Temp 98.1 F (36.7 C) (Oral)   Resp 14   Ht 6' (1.829 m)   Wt 245  lb (111.1 kg)   SpO2 98%   BMI 33.23 kg/m   Physical Exam  Constitutional: He is oriented to person, place, and time. He appears well-developed and well-nourished. No distress.  HENT:  Head: Normocephalic and atraumatic.  Right Ear: Tympanic membrane, external ear and ear canal normal.  Left Ear: Tympanic membrane, external ear and ear canal normal.  Nose: Nose normal.  Mouth/Throat: Oropharynx is clear and moist and mucous membranes are normal. No posterior oropharyngeal edema or posterior oropharyngeal erythema.  Eyes: Pupils are equal, round, and reactive to light. Conjunctivae are normal.  Neck: Neck supple. No thyromegaly  present.  Cardiovascular: Normal rate, regular rhythm, normal heart sounds and intact distal pulses.  Pulmonary/Chest: Effort normal and breath sounds normal. No stridor. No respiratory distress. He has no wheezes. He has no rales. He exhibits no tenderness.  Abdominal: Soft. Bowel sounds are normal. He exhibits no distension and no mass. There is no tenderness. There is no rebound and no guarding.  Musculoskeletal:       Left knee: Normal. He exhibits normal range of motion and no swelling. No tenderness found.       Feet:  Lymphadenopathy:    He has no cervical adenopathy.  Neurological: He is alert and oriented to person, place, and time. No cranial nerve deficit.  Skin: Skin is warm and dry. No rash noted. He is not diaphoretic.  Psychiatric: He has a normal mood and affect.  Vitals reviewed.  Assessment/Plan: 1. Visit for preventive health examination Depression screen negative. Health Maintenance reviewed -- Immunizations are up-to-date. Preventive schedule discussed and handout given in AVS. Will obtain fasting labs today.  - CBC with Differential/Platelet - Comprehensive metabolic panel - Lipid panel - Hemoglobin A1c  2. Plantar fasciitis, left Discussed supportive measures and exercises for improvement. Start ACE wrap and turmeric supplements. Follow-up if not improving.   3. Prostate cancer screening The natural history of prostate cancer and ongoing controversy regarding screening and potential treatment outcomes of prostate cancer has been discussed with the patient. The meaning of a false positive PSA and a false negative PSA has been discussed. He indicates understanding of the limitations of this screening test and wishes to proceed with screening PSA testing.  - PSA   Leeanne Rio, PA-C

## 2017-10-25 ENCOUNTER — Ambulatory Visit: Payer: PRIVATE HEALTH INSURANCE | Admitting: Family Medicine

## 2017-10-26 ENCOUNTER — Encounter: Payer: Self-pay | Admitting: Family Medicine

## 2017-10-26 ENCOUNTER — Other Ambulatory Visit: Payer: Self-pay

## 2017-10-26 ENCOUNTER — Ambulatory Visit: Payer: PRIVATE HEALTH INSURANCE | Admitting: Family Medicine

## 2017-10-26 VITALS — BP 112/80 | HR 68 | Resp 16 | Ht 72.0 in | Wt 251.0 lb

## 2017-10-26 DIAGNOSIS — H6981 Other specified disorders of Eustachian tube, right ear: Secondary | ICD-10-CM

## 2017-10-26 DIAGNOSIS — H6991 Unspecified Eustachian tube disorder, right ear: Secondary | ICD-10-CM

## 2017-10-26 MED ORDER — FLUTICASONE PROPIONATE 50 MCG/ACT NA SUSP: 2.0000 | Freq: Every day | NASAL | 3 refills | Status: DC

## 2017-10-26 MED FILL — FLUTICASONE PROP 50 MCG SPR: 50 | 30 days supply | Qty: 16 | Fill #0

## 2017-10-26 NOTE — Patient Instructions (Signed)
Follow up as needed or as scheduled START the Flonase 2 sprays each nostril daily Drink plenty of fluids The sound that you're hearing is due to nasal congestion and will improve Continue to work on low carb diet and regular exercise (goal is cardio 3-4x/week) Call with any questions or concerns Have a great summer!!

## 2017-10-26 NOTE — Progress Notes (Signed)
   Subjective:    Patient ID: William Leon, male    DOB: Apr 09, 1971, 47 y.o.   MRN: [id number]  HPI Ear buzzing- R ear, sxs started ~3 days ago.  No pain.  'just a weird noise'.  + nasal congestion.  No drainage- put peroxide in ear w/o relief.  Has had hx of wax impaction.  No fevers.  No dizziness.  No recent swimming   Review of Systems For ROS see HPI     Objective:   Physical Exam  Constitutional: He appears well-developed and well-nourished. No distress.  HENT:  Head: Normocephalic and atraumatic.  No TTP over sinuses + turbinate edema + PND R TM retracted, L TM WNL  Eyes: Pupils are equal, round, and reactive to light. Conjunctivae and EOM are normal.  Neck: Normal range of motion. Neck supple.  Cardiovascular: Normal rate, regular rhythm and normal heart sounds.  Pulmonary/Chest: Effort normal and breath sounds normal. No respiratory distress. He has no wheezes.  Lymphadenopathy:    He has no cervical adenopathy.  Skin: Skin is warm and dry.          Assessment & Plan:  Eustachian tube dysfxn- new.  Pt's sxs and PE consistent w/ dx.  Start daily Flonase.  Reviewed dx and tx w/ pt.  Pt expressed understanding and is in agreement w/ plan.

## 2017-10-31 ENCOUNTER — Encounter: Payer: Self-pay | Admitting: Family Medicine

## 2017-10-31 ENCOUNTER — Ambulatory Visit: Payer: PRIVATE HEALTH INSURANCE | Admitting: Family Medicine

## 2017-10-31 VITALS — BP 118/66 | HR 86 | Temp 98.6°F | Ht 71.0 in | Wt 250.4 lb

## 2017-10-31 DIAGNOSIS — M722 Plantar fascial fibromatosis: Secondary | ICD-10-CM | POA: Diagnosis not present

## 2017-10-31 DIAGNOSIS — J40 Bronchitis, not specified as acute or chronic: Secondary | ICD-10-CM

## 2017-10-31 MED ORDER — GUAIFENESIN-CODEINE 100-10 MG/5ML PO SOLN: ORAL | 0 refills | Status: DC

## 2017-10-31 MED ORDER — AZITHROMYCIN 250 MG PO TABS: ORAL_TABLET | ORAL | 0 refills | Status: DC

## 2017-10-31 MED FILL — AZITHROMYCIN 250 MG TABLET: 250 | 5 days supply | Qty: 6 | Fill #0

## 2017-10-31 MED FILL — GUAIATUSSIN AC LIQUID: 100-10 | 12 days supply | Qty: 120 | Fill #0

## 2017-10-31 NOTE — Progress Notes (Signed)
Patient ID: William Leon, male    DOB: 03/24/1971  Age: 47 y.o. MRN: [id number]    Subjective:  Subjective  HPI William Leon presents for sore throat and cough.  He saw his pcp last week and received flonase but has not started using it  Pt also had questions about L heel pain.  He recently ruptured his R achilles tendon on a cruise--  That has improved but he now c/o of his L heel hurting.  It is painful when he first gets up in the am and subsides with walking but worsens again when he sits and gets back up  Review of Systems  Constitutional: Negative for chills and fever.  HENT: Positive for congestion, postnasal drip, rhinorrhea and sinus pressure.   Respiratory: Positive for cough, chest tightness, shortness of breath and wheezing.   Cardiovascular: Negative for chest pain, palpitations and leg swelling.  Allergic/Immunologic: Negative for environmental allergies.    History No past medical history on file.  He has a past surgical history that includes No past surgeries and Achilles tendon surgery (Right, 01/2017).   His family history is not on file.He reports that he has never smoked. He has never used smokeless tobacco. He reports that he does not drink alcohol or use drugs.  Current Outpatient Medications on File Prior to Visit  Medication Sig Dispense Refill  . fluticasone (FLONASE) 50 MCG/ACT nasal spray Place 2 sprays into both nostrils daily. 16 g 3   No current facility-administered medications on file prior to visit.      Objective:  Objective  Physical Exam  Constitutional: He is oriented to person, place, and time. He appears well-developed and well-nourished.  HENT:  Right Ear: External ear normal.  Left Ear: External ear normal.  + PND + errythema  Eyes: Conjunctivae are normal. Right eye exhibits no discharge. Left eye exhibits no discharge.  Cardiovascular: Normal rate, regular rhythm and normal heart sounds.  No murmur heard. Pulmonary/Chest: Effort  normal. No respiratory distress. He has decreased breath sounds. He has no wheezes. He has no rales. He exhibits no tenderness.  Musculoskeletal: He exhibits no edema.  Lymphadenopathy:    He has cervical adenopathy.  Neurological: He is alert and oriented to person, place, and time.  Nursing note and vitals reviewed.  BP 118/66 (BP Location: Left Arm, Patient Position: Sitting, Cuff Size: Large)   Pulse 86   Temp 98.6 F (37 C) (Oral)   Ht 5\' 11"  (1.803 m)   Wt 250 lb 6.4 oz (113.6 kg)   SpO2 96%   BMI 34.92 kg/m  Wt Readings from Last 3 Encounters:  10/31/17 250 lb 6.4 oz (113.6 kg)  10/26/17 251 lb (113.9 kg)  09/08/17 245 lb (111.1 kg)     Lab Results  Component Value Date   WBC 6.5 09/08/2017   HGB 16.9 09/08/2017   HCT 50.8 09/08/2017   PLT 179.0 09/08/2017   GLUCOSE 104 (H) 09/08/2017   CHOL 192 09/08/2017   TRIG 172.0 (H) 09/08/2017   HDL 35.00 (L) 09/08/2017   LDLDIRECT 138.0 09/07/2016   LDLCALC 123 (H) 09/08/2017   ALT 25 09/08/2017   AST 22 09/08/2017   NA 139 09/08/2017   K 4.1 09/08/2017   CL 102 09/08/2017   CREATININE 1.49 09/08/2017   BUN 14 09/08/2017   CO2 29 09/08/2017   TSH 2.06 09/07/2016   PSA 0.23 09/08/2017   HGBA1C 5.9 09/08/2017    Dg Ankle Complete Right  Result Date: 02/07/2017  CLINICAL DATA:  Achilles tendon pain after playing volleyball 4 days ago. EXAM: RIGHT ANKLE - COMPLETE 3+ VIEW COMPARISON:  None. FINDINGS: There is no evidence of fracture, dislocation, or joint effusion. There is no evidence of arthropathy or other focal bone abnormality. Thickening of the Achilles shadow. Mild inflammatory stranding in Kager's fat pad. IMPRESSION: Thickening of the Achilles shadow, suggestive of underlying Achilles tendon pathology. No osseous avulsion injury. Electronically Signed   By: Titus Dubin M.D.   On: 02/07/2017 10:53     Assessment & Plan:  Plan  I am having William Leon start on azithromycin and guaiFENesin-codeine. I am also  having him maintain his fluticasone.  Meds ordered this encounter  Medications  . azithromycin (ZITHROMAX Z-PAK) 250 MG tablet    Sig: As directed    Dispense:  6 each    Refill:  0  . guaiFENesin-codeine 100-10 MG/5ML syrup    Sig: 1-2 tsp po qhs prn    Dispense:  120 mL    Refill:  0    Problem List Items Addressed This Visit      Unprioritized   Plantar fasciitis    Ice, stretching Antiinflammatories  Orthotics F/u sport med       Other Visit Diagnoses    Bronchitis    -  Primary   Relevant Medications   azithromycin (ZITHROMAX Z-PAK) 250 MG tablet   guaiFENesin-codeine 100-10 MG/5ML syrup      Follow-up: No follow-ups on file.  Ann Held, DO

## 2017-10-31 NOTE — Patient Instructions (Signed)

## 2017-10-31 NOTE — Assessment & Plan Note (Signed)
Ice, stretching Antiinflammatories  Orthotics F/u sport med

## 2018-02-23 ENCOUNTER — Ambulatory Visit: Payer: PRIVATE HEALTH INSURANCE | Admitting: Family Medicine

## 2018-02-23 DIAGNOSIS — Z0289 Encounter for other administrative examinations: Secondary | ICD-10-CM

## 2018-02-24 ENCOUNTER — Encounter: Payer: Self-pay | Admitting: Family Medicine

## 2018-03-01 ENCOUNTER — Telehealth: Payer: Self-pay

## 2018-03-01 NOTE — Telephone Encounter (Signed)
Copied from [place] [id number]. Topic: General - Other >> Feb 24, 2018  1:15 PM Yvette Rack wrote: Reason for CRM: pt calling stating that he didn't want to pay a $50 no show fee because he wouldn't have made an appt for yesterday because he had meetings and that he thought it was for today to see [facility]  please call pt if you have any questions [phone] >> Feb 24, 2018  2:39 PM Yvette Rack wrote: Guerry Bruin wanted me to resend this crm >> Mar 01, 2018  2:23 PM Bea Graff, NT wrote: Pt calling to check status to see if the no show fee can be waived. Please advise.

## 2018-03-02 NOTE — Telephone Encounter (Signed)
Ok to waive

## 2018-03-02 NOTE — Telephone Encounter (Signed)
Dr. Etter Sjogren can we waive for this patient

## 2018-03-03 NOTE — Telephone Encounter (Signed)
noted 

## 2018-04-10 ENCOUNTER — Ambulatory Visit: Payer: Self-pay | Admitting: *Deleted

## 2018-04-10 NOTE — Telephone Encounter (Signed)
If pt has worsening pain, unstoppable nausea/vomiting he needs to go to ER overnight

## 2018-04-10 NOTE — Telephone Encounter (Signed)
Pt called with complaints of left abdominal pain for 3 days; he says that when he inhales he feels a sharp pain; the area is also painful to the touch or when he coughs; recommendations made per nurse triage protocol; pt offered and accepted appointment with Dr Birdie Riddle, [facility],04/11/18 at 0945; he verbalized understanding; will route to office for notification of this upcoming appointment.    Reason for Disposition . [1] MILD pain (e.g., does not interfere with normal activities) AND [2] pain comes and goes (cramps) [3] present > 48 hours  Answer Assessment - Initial Assessment Questions 1. LOCATION: "Where does it hurt?"      Left upper abdominal area 2. RADIATION: "Does the pain shoot anywhere else?" (e.g., chest, back)     no 3. ONSET: "When did the pain begin?" (Minutes, hours or days ago)      04/07/18 4. SUDDEN: "Gradual or sudden onset?"     Graduall 5. PATTERN "Does the pain come and go, or is it constant?"    - If constant: "Is it getting better, staying the same, or worsening?"      (Note: Constant means the pain never goes away completely; most serious pain is constant and it progresses)     - If intermittent: "How long does it last?" "Do you have pain now?"     (Note: Intermittent means the pain goes away completely between bouts)     Intermittent when pt takes a deep breath or touches the area 6. SEVERITY: "How bad is the pain?"  (e.g., Scale 1-10; mild, moderate, or severe)    - MILD (1-3): doesn't interfere with normal activities, abdomen soft and not tender to touch     - MODERATE (4-7): interferes with normal activities or awakens from sleep, tender to touch     - SEVERE (8-10): excruciating pain, doubled over, unable to do any normal activities       Rated 3 out of 10 7. RECURRENT SYMPTOM: "Have you ever had this type of abdominal pain before?" If so, ask: "When was the last time?" and "What happened that time?"      no 8. CAUSE: "What do you think is causing the  abdominal pain?"     Not sure 9. RELIEVING/AGGRAVATING FACTORS: "What makes it better or worse?" (e.g., movement, antacids, bowel movement)     Deep inhalation makes it worse; not coughing makes it better  10. OTHER SYMPTOMS: "Has there been any vomiting, diarrhea, constipation, or urine problems?"       no  Protocols used: ABDOMINAL PAIN - MALE-A-AH

## 2018-04-10 NOTE — Telephone Encounter (Signed)
Patient notified of PCP recommendations and is agreement and expresses an understanding.   Ok for PEC to Discuss results / PCP recommendations / Schedule patient.   

## 2018-04-11 ENCOUNTER — Encounter: Payer: Self-pay | Admitting: Family Medicine

## 2018-04-11 ENCOUNTER — Ambulatory Visit: Payer: PRIVATE HEALTH INSURANCE | Admitting: Family Medicine

## 2018-04-11 ENCOUNTER — Other Ambulatory Visit: Payer: Self-pay

## 2018-04-11 VITALS — BP 132/83 | HR 76 | Temp 98.3°F | Resp 16 | Ht 71.0 in | Wt 249.2 lb

## 2018-04-11 DIAGNOSIS — M722 Plantar fascial fibromatosis: Secondary | ICD-10-CM | POA: Diagnosis not present

## 2018-04-11 DIAGNOSIS — E669 Obesity, unspecified: Secondary | ICD-10-CM

## 2018-04-11 DIAGNOSIS — R0781 Pleurodynia: Secondary | ICD-10-CM

## 2018-04-11 MED ORDER — MELOXICAM 15 MG PO TABS: 15.0000 mg | ORAL_TABLET | Freq: Every day | ORAL | 0 refills | Status: DC

## 2018-04-11 MED FILL — MELOXICAM 15 MG TABLET: 15 | 30 days supply | Qty: 30 | Fill #0

## 2018-04-11 NOTE — Assessment & Plan Note (Signed)
Deteriorated.  Pt has been overcompensating for his injury on the R and bearing more weight on the L.  Now w/ heel pain.  Has seen Dr Barbaraann Barthel in the past and would like a referral back.  Referral placed.  Start daily Meloxicam- take w/ food.  Pt expressed understanding and is in agreement w/ plan.

## 2018-04-11 NOTE — Progress Notes (Signed)
   Subjective:    Patient ID: William Leon, male    DOB: [birth year], 47 y.o.   MRN: [id number]  HPI abd pain- sxs started 4 days ago.  Pt has been doing a lot of heavy lifting recently.  Pt reports LUQ pain- worse w/ inhalation.  No pain at rest.  Pain worsens w/ certain movements.  TTP over lower cartilaginous rib attachments.  Less severe than yesterday.  No N/V.   L foot pain- pt reports sharp pain in the AM, improves w/ movement but then worsens at end of day.  Has been overcompensating and putting more weight on L foot b/c he had achilles surgery on R.  Wants referral back to Dr Barbaraann Barthel  Obesity- pt's BMI is 34.75.  Would like his labs rechecked b/c 'due to surgery, I haven't been working out the way I should'.   Review of Systems For ROS see HPI     Objective:   Physical Exam Vitals signs reviewed.  Constitutional:      General: He is not in acute distress.    Appearance: He is well-developed.  HENT:     Head: Normocephalic and atraumatic.  Eyes:     Conjunctiva/sclera: Conjunctivae normal.     Pupils: Pupils are equal, round, and reactive to light.  Neck:     Musculoskeletal: Normal range of motion and neck supple.     Thyroid: No thyromegaly.  Cardiovascular:     Rate and Rhythm: Normal rate and regular rhythm.     Heart sounds: Normal heart sounds. No murmur.  Pulmonary:     Effort: Pulmonary effort is normal. No respiratory distress.     Breath sounds: Normal breath sounds.  Abdominal:     General: Bowel sounds are normal. There is no distension.     Palpations: Abdomen is soft.     Tenderness: There is abdominal tenderness (TTP over L lower ribs at cartilaginous attachments). There is no guarding or rebound.  Lymphadenopathy:     Cervical: No cervical adenopathy.  Skin:    General: Skin is warm and dry.  Neurological:     Mental Status: He is alert and oriented to person, place, and time.     Cranial Nerves: No cranial nerve deficit.  Psychiatric:      Behavior: Behavior normal.           Assessment & Plan:  Rib pain- new.  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  Pain is improving.  TTP over lower ribs and not truly his abdomen.  Start scheduled NSAIDs.  Reviewed supportive care and red flags that should prompt return.  Pt expressed understanding and is in agreement w/ plan.   Obesity- ongoing issue for pt.  He has not been able to exercise recently due to achilles surgery.  Check labs to risk stratify.  Will follow closely.

## 2018-04-11 NOTE — Patient Instructions (Signed)
Schedule your complete physical for after 5/30 We'll notify you of your lab results and make any changes if needed Continue to work on healthy diet and regular exercise- you can do it! We'll call you with your appt w/ Dr Barbaraann Barthel START the Meloxicam once daily- take w/ food You have a lower rib strain and this will improve w/ time and the medication.  Take it easy! Call with any questions or concerns Happy New Year!

## 2018-04-12 LAB — TSH: TSH: 2 mIU/L (ref 0.40–4.50)

## 2018-04-12 LAB — CBC WITH DIFFERENTIAL/PLATELET
Absolute Monocytes: 469 cells/uL (ref 200–950)
BASOS PCT: 0.8 %
Basophils Absolute: 53 cells/uL (ref 0–200)
Eosinophils Absolute: 231 cells/uL (ref 15–500)
Eosinophils Relative: 3.5 %
HEMATOCRIT: 50.8 % — AB (ref 38.5–50.0)
Hemoglobin: 17.3 g/dL — ABNORMAL HIGH (ref 13.2–17.1)
LYMPHS ABS: 1802 {cells}/uL (ref 850–3900)
MCH: 29.2 pg (ref 27.0–33.0)
MCHC: 34.1 g/dL (ref 32.0–36.0)
MCV: 85.7 fL (ref 80.0–100.0)
MPV: 11.4 fL (ref 7.5–12.5)
Monocytes Relative: 7.1 %
NEUTROS ABS: 4046 {cells}/uL (ref 1500–7800)
Neutrophils Relative %: 61.3 %
Platelets: 183 10*3/uL (ref 140–400)
RBC: 5.93 10*6/uL — ABNORMAL HIGH (ref 4.20–5.80)
RDW: 13.9 % (ref 11.0–15.0)
Total Lymphocyte: 27.3 %
WBC: 6.6 10*3/uL (ref 3.8–10.8)

## 2018-04-12 LAB — BASIC METABOLIC PANEL
BUN/Creatinine Ratio: 12 (calc) (ref 6–22)
BUN: 18 mg/dL (ref 7–25)
CALCIUM: 9.6 mg/dL (ref 8.6–10.3)
CO2: 25 mmol/L (ref 20–32)
Chloride: 105 mmol/L (ref 98–110)
Creat: 1.51 mg/dL — ABNORMAL HIGH (ref 0.60–1.35)
GLUCOSE: 101 mg/dL — AB (ref 65–99)
POTASSIUM: 4.1 mmol/L (ref 3.5–5.3)
Sodium: 139 mmol/L (ref 135–146)

## 2018-04-12 LAB — HEPATIC FUNCTION PANEL
AG Ratio: 1.4 (calc) (ref 1.0–2.5)
ALT: 25 U/L (ref 9–46)
AST: 23 U/L (ref 10–40)
Albumin: 4.2 g/dL (ref 3.6–5.1)
Alkaline phosphatase (APISO): 61 U/L (ref 40–115)
Bilirubin, Direct: 0.1 mg/dL (ref 0.0–0.2)
GLOBULIN: 2.9 g/dL (ref 1.9–3.7)
Indirect Bilirubin: 0.4 mg/dL (calc) (ref 0.2–1.2)
Total Bilirubin: 0.5 mg/dL (ref 0.2–1.2)
Total Protein: 7.1 g/dL (ref 6.1–8.1)

## 2018-04-12 LAB — LIPID PANEL
CHOL/HDL RATIO: 5.2 (calc) — AB (ref <5.0)
Cholesterol: 198 mg/dL (ref <200)
HDL: 38 mg/dL — ABNORMAL LOW (ref >40)
LDL CHOLESTEROL (CALC): 135 mg/dL — AB
Non-HDL Cholesterol (Calc): 160 mg/dL (calc) — ABNORMAL HIGH (ref <130)
Triglycerides: 123 mg/dL (ref <150)

## 2018-04-13 ENCOUNTER — Encounter: Payer: Self-pay | Admitting: General Practice

## 2018-04-13 ENCOUNTER — Encounter: Payer: Self-pay | Admitting: Family Medicine

## 2018-04-13 ENCOUNTER — Ambulatory Visit (INDEPENDENT_AMBULATORY_CARE_PROVIDER_SITE_OTHER): Payer: PRIVATE HEALTH INSURANCE | Admitting: Family Medicine

## 2018-04-13 VITALS — BP 136/81 | HR 75 | Ht 71.0 in | Wt 240.0 lb

## 2018-04-13 DIAGNOSIS — M722 Plantar fascial fibromatosis: Secondary | ICD-10-CM | POA: Diagnosis not present

## 2018-04-13 NOTE — Patient Instructions (Signed)
You have plantar fasciitis Take tylenol and/or aleve as needed for pain  Plantar fascia stretch for 20-30 seconds (do 3 of these) in morning Lowering/raise on a step exercises 3 x 10 once or twice a day - this is very important for long term recovery. Can add heel walks, toe walks forward and backward as well Ice heel for 15 minutes as needed. Avoid flat shoes/barefoot walking as much as possible. Arch straps have been shown to help with pain. Inserts are important (dr. Zoe Lan active series, spencos, our green insoles, custom orthotics). Steroid injection is a consideration for short term pain relief if you are struggling. Physical therapy is also an option. Follow up with me in 6 weeks.

## 2018-04-14 ENCOUNTER — Encounter: Payer: Self-pay | Admitting: Family Medicine

## 2018-04-14 ENCOUNTER — Ambulatory Visit: Payer: PRIVATE HEALTH INSURANCE | Admitting: Family Medicine

## 2018-04-14 NOTE — Progress Notes (Signed)
PCP: Midge Minium, MD  Subjective:   HPI: Patient is a 48 y.o. male here for left foot pain.  Patient reoprts for several months he's had plantar left foot pain. He stands/walks up to 12 hours a day and has to wear steel toe boots. Pain in foot worse after prolonged sitting, first thing in morning. Pain level 5/10 and sharp. Has tried meloxicam without much benefit. No skin changes, numbness.  History reviewed. No pertinent past medical history.  Current Outpatient Medications on File Prior to Visit  Medication Sig Dispense Refill  . meloxicam (MOBIC) 15 MG tablet Take 1 tablet (15 mg total) by mouth daily. 30 tablet 0   No current facility-administered medications on file prior to visit.     Past Surgical History:  Procedure Laterality Date  . ACHILLES TENDON SURGERY Right 01/2017  . NO PAST SURGERIES      No Known Allergies  Social History   Socioeconomic History  . Marital status: Single    Spouse name: Not on file  . Number of children: Not on file  . Years of education: Not on file  . Highest education level: Not on file  Occupational History  . Not on file  Social Needs  . Financial resource strain: Not on file  . Food insecurity:    Worry: Not on file    Inability: Not on file  . Transportation needs:    Medical: Not on file    Non-medical: Not on file  Tobacco Use  . Smoking status: Never Smoker  . Smokeless tobacco: Never Used  Substance and Sexual Activity  . Alcohol use: No    Alcohol/week: 0.0 standard drinks  . Drug use: No  . Sexual activity: Not on file  Lifestyle  . Physical activity:    Days per week: Not on file    Minutes per session: Not on file  . Stress: Not on file  Relationships  . Social connections:    Talks on phone: Not on file    Gets together: Not on file    Attends religious service: Not on file    Active member of club or organization: Not on file    Attends meetings of clubs or organizations: Not on file   Relationship status: Not on file  . Intimate partner violence:    Fear of current or ex partner: Not on file    Emotionally abused: Not on file    Physically abused: Not on file    Forced sexual activity: Not on file  Other Topics Concern  . Not on file  Social History Narrative  . Not on file    History reviewed. No pertinent family history.  BP 136/81   Pulse 75   Ht 5\' 11"  (1.803 m)   Wt 240 lb (108.9 kg)   BMI 33.47 kg/m   Review of Systems: See HPI above.     Objective:  Physical Exam:  Gen: NAD, comfortable in exam room  Left foot/ankle: No gross deformity, swelling, ecchymoses FROM with 5/5 strength all directions. TTP medial calcaneus at plantar fascia insertion.  No other tenderness. Negative ant drawer and talar tilt.   Negative syndesmotic compression. Negative calcaneal squeeze. Thompsons test negative. NV intact distally.  Right foot/ankle: Mod pronation.  No other deformity. FROM with 5/5 strength. No tenderness to palpation. NVI distally.  Assessment & Plan:  1. Left foot plantar fasciitis - shown home exercises and stretches to do daily.  Tylenol or aleve if needed.  Arch binders, arch supports.  Consider injection, physical therapy, night splints.  F/u in 6 weeks.

## 2018-05-24 ENCOUNTER — Ambulatory Visit (INDEPENDENT_AMBULATORY_CARE_PROVIDER_SITE_OTHER): Payer: PRIVATE HEALTH INSURANCE | Admitting: Family Medicine

## 2018-05-24 ENCOUNTER — Encounter: Payer: Self-pay | Admitting: Family Medicine

## 2018-05-24 VITALS — BP 125/67 | HR 85 | Ht 71.0 in | Wt 244.0 lb

## 2018-05-24 DIAGNOSIS — M722 Plantar fascial fibromatosis: Secondary | ICD-10-CM | POA: Diagnosis not present

## 2018-05-24 NOTE — Patient Instructions (Signed)
You have plantar fasciitis Take tylenol and/or aleve as needed for pain  Plantar fascia stretch for 20-30 seconds (do 3 of these) in morning Lowering/raise on a step exercises 3 x 10 once or twice a day - this is very important for long term recovery. Can add heel walks, toe walks forward and backward as well Ice heel for 15 minutes as needed. Avoid flat shoes/barefoot walking as much as possible. Arch straps have been shown to help with pain. Inserts are important (dr. Zoe Lan active series, spencos, our green insoles, custom orthotics). Steroid injection is a consideration for short term pain relief if you are struggling. Physical therapy is also an option. Follow up with me in 6 weeks or as needed if you're doing well.

## 2018-05-25 ENCOUNTER — Encounter: Payer: Self-pay | Admitting: Family Medicine

## 2018-05-25 NOTE — Progress Notes (Signed)
PCP: Midge Minium, MD  Subjective:   HPI: Patient is a 48 y.o. male here for left foot pain.  1/2: Patient reoprts for several months he's had plantar left foot pain. He stands/walks up to 12 hours a day and has to wear steel toe boots. Pain in foot worse after prolonged sitting, first thing in morning. Pain level 5/10 and sharp. Has tried meloxicam without much benefit. No skin changes, numbness.  2/12: Patient reports improvement since last visit. Better with activity, worse in the morning. Pain level 1/10. Wearing dr. Zoe Lan inserts, arch binder, doing home exercises. No skin changes.  History reviewed. No pertinent past medical history.  Current Outpatient Medications on File Prior to Visit  Medication Sig Dispense Refill  . meloxicam (MOBIC) 15 MG tablet Take 1 tablet (15 mg total) by mouth daily. 30 tablet 0   No current facility-administered medications on file prior to visit.     Past Surgical History:  Procedure Laterality Date  . ACHILLES TENDON SURGERY Right 01/2017  . NO PAST SURGERIES      No Known Allergies  Social History   Socioeconomic History  . Marital status: Single    Spouse name: Not on file  . Number of children: Not on file  . Years of education: Not on file  . Highest education level: Not on file  Occupational History  . Not on file  Social Needs  . Financial resource strain: Not on file  . Food insecurity:    Worry: Not on file    Inability: Not on file  . Transportation needs:    Medical: Not on file    Non-medical: Not on file  Tobacco Use  . Smoking status: Never Smoker  . Smokeless tobacco: Never Used  Substance and Sexual Activity  . Alcohol use: No    Alcohol/week: 0.0 standard drinks  . Drug use: No  . Sexual activity: Not on file  Lifestyle  . Physical activity:    Days per week: Not on file    Minutes per session: Not on file  . Stress: Not on file  Relationships  . Social connections:    Talks on  phone: Not on file    Gets together: Not on file    Attends religious service: Not on file    Active member of club or organization: Not on file    Attends meetings of clubs or organizations: Not on file    Relationship status: Not on file  . Intimate partner violence:    Fear of current or ex partner: Not on file    Emotionally abused: Not on file    Physically abused: Not on file    Forced sexual activity: Not on file  Other Topics Concern  . Not on file  Social History Narrative  . Not on file    History reviewed. No pertinent family history.  BP 125/67   Pulse 85   Ht 5\' 11"  (1.803 m)   Wt 244 lb (110.7 kg)   BMI 34.03 kg/m   Review of Systems: See HPI above.     Objective:  Physical Exam:  Gen: NAD, comfortable in exam room  Left foot/ankle: No gross deformity, swelling, ecchymoses FROM with 5/5 strength all directions. No TTP currently Negative calcaneal squeeze. NV intact distally.  Assessment & Plan:  1. Left foot plantar fasciitis - continue exercises, arch binders.  He will get better arch supports.  Consider night splints, injection, physical therapy.  F/u in 6  weeks or as needed if doing well.

## 2018-11-01 ENCOUNTER — Encounter: Payer: Self-pay | Admitting: Family Medicine

## 2018-11-02 ENCOUNTER — Encounter: Payer: Self-pay | Admitting: Family Medicine

## 2018-11-03 ENCOUNTER — Encounter: Payer: Self-pay | Admitting: Family Medicine

## 2019-01-23 ENCOUNTER — Other Ambulatory Visit: Payer: Self-pay

## 2019-01-23 ENCOUNTER — Encounter: Payer: Self-pay | Admitting: Physician Assistant

## 2019-01-23 ENCOUNTER — Ambulatory Visit (INDEPENDENT_AMBULATORY_CARE_PROVIDER_SITE_OTHER): Payer: PRIVATE HEALTH INSURANCE | Admitting: Physician Assistant

## 2019-01-23 DIAGNOSIS — Z20828 Contact with and (suspected) exposure to other viral communicable diseases: Secondary | ICD-10-CM | POA: Diagnosis not present

## 2019-01-23 DIAGNOSIS — Z20822 Contact with and (suspected) exposure to covid-19: Secondary | ICD-10-CM

## 2019-01-23 NOTE — Progress Notes (Signed)
I have discussed the procedure for the virtual visit with the patient who has given consent to proceed with assessment and treatment.   Fantashia Shupert S Macrina Lehnert, CMA     

## 2019-01-23 NOTE — Patient Instructions (Signed)
Continue to quarantine until results are in. Hopefully this was a seasonal virus since you are feeling better since late morning, but we have to make sure COVID is not the cause.   Keep hydrated and rest. Tylenol or Ibuprofen if needed for aches or fever.  Mucinex if needed for any developing chest congestion  If you experience significant shortness of breath, please call 911  You have been enrolled in a symptom monitoring program until we get results. Please fill these out as directed so we can keep up to date on how you are doing.     Individuals who are confirmed to have, or are being evaluated for, COVID-19 should follow the prevention steps below until a healthcare provider or local or state health department says they can return to normal activities.  Stay home except to get medical care You should restrict activities outside your home, except for getting medical care. Do not go to work, school, or public areas, and do not use public transportation or taxis.  Call ahead before visiting your doctor Before your medical appointment, call the healthcare provider and tell them that you have, or are being evaluated for, COVID-19 infection. This will help the healthcare provider's office take steps to keep other people from getting infected. Ask your healthcare provider to call the local or state health department.  Monitor your symptoms Seek prompt medical attention if your illness is worsening (e.g., difficulty breathing). Before going to your medical appointment, call the healthcare provider and tell them that you have, or are being evaluated for, COVID-19 infection. Ask your healthcare provider to call the local or state health department.  Wear a facemask You should wear a facemask that covers your nose and mouth when you are in the same room with other people and when you visit a healthcare provider. People who live with or visit you should also wear a facemask while they are in  the same room with you.  Separate yourself from other people in your home As much as possible, you should stay in a different room from other people in your home. Also, you should use a separate bathroom, if available.  Avoid sharing household items You should not share dishes, drinking glasses, cups, eating utensils, towels, bedding, or other items with other people in your home. After using these items, you should wash them thoroughly with soap and water.  Cover your coughs and sneezes Cover your mouth and nose with a tissue when you cough or sneeze, or you can cough or sneeze into your sleeve. Throw used tissues in a lined trash can, and immediately wash your hands with soap and water for at least 20 seconds or use an alcohol-based hand rub.  Wash your [facility] your hands often and thoroughly with soap and water for at least 20 seconds. You can use an alcohol-based hand sanitizer if soap and water are not available and if your hands are not visibly dirty. Avoid touching your eyes, nose, and mouth with unwashed hands.   Prevention Steps for Caregivers and Household Members of Individuals Confirmed to have, or Being Evaluated for, COVID-19 Infection Being Cared for in the Home  If you live with, or provide care at home for, a person confirmed to have, or being evaluated for, COVID-19 infection please follow these guidelines to prevent infection:  Follow healthcare provider's instructions Make sure that you understand and can help the patient follow any healthcare provider instructions for all care.  Provide for the patient's  basic needs You should help the patient with basic needs in the home and provide support for getting groceries, prescriptions, and other personal needs.  Monitor the patient's symptoms If they are getting sicker, call his or her medical provider and tell them that the patient has, or is being evaluated for, COVID-19 infection. This will help the healthcare  provider's office take steps to keep other people from getting infected. Ask the healthcare provider to call the local or state health department.  Limit the number of people who have contact with the patient  If possible, have only one caregiver for the patient.  Other household members should stay in another home or place of residence. If this is not possible, they should stay  in another room, or be separated from the patient as much as possible. Use a separate bathroom, if available.  Restrict visitors who do not have an essential need to be in the home.  Keep older adults, very young children, and other sick people away from the patient Keep older adults, very young children, and those who have compromised immune systems or chronic health conditions away from the patient. This includes people with chronic heart, lung, or kidney conditions, diabetes, and cancer.  Ensure good ventilation Make sure that shared spaces in the home have good air flow, such as from an air conditioner or an opened window, weather permitting.  Wash your hands often  Wash your hands often and thoroughly with soap and water for at least 20 seconds. You can use an alcohol based hand sanitizer if soap and water are not available and if your hands are not visibly dirty.  Avoid touching your eyes, nose, and mouth with unwashed hands.  Use disposable paper towels to dry your hands. If not available, use dedicated cloth towels and replace them when they become wet.  Wear a facemask and gloves  Wear a disposable facemask at all times in the room and gloves when you touch or have contact with the patient's blood, body fluids, and/or secretions or excretions, such as sweat, saliva, sputum, nasal mucus, vomit, urine, or feces.  Ensure the mask fits over your nose and mouth tightly, and do not touch it during use.  Throw out disposable facemasks and gloves after using them. Do not reuse.  Wash your hands immediately  after removing your facemask and gloves.  If your personal clothing becomes contaminated, carefully remove clothing and launder. Wash your hands after handling contaminated clothing.  Place all used disposable facemasks, gloves, and other waste in a lined container before disposing them with other household waste.  Remove gloves and wash your hands immediately after handling these items.  Do not share dishes, glasses, or other household items with the patient  Avoid sharing household items. You should not share dishes, drinking glasses, cups, eating utensils, towels, bedding, or other items with a patient who is confirmed to have, or being evaluated for, COVID-19 infection.  After the person uses these items, you should wash them thoroughly with soap and water.  Wash laundry thoroughly  Immediately remove and wash clothes or bedding that have blood, body fluids, and/or secretions or excretions, such as sweat, saliva, sputum, nasal mucus, vomit, urine, or feces, on them.  Wear gloves when handling laundry from the patient.  Read and follow directions on labels of laundry or clothing items and detergent. In general, wash and dry with the warmest temperatures recommended on the label.  Clean all areas the individual has used often  Clean all touchable surfaces, such as counters, tabletops, doorknobs, bathroom fixtures, toilets, phones, keyboards, tablets, and bedside tables, every day. Also, clean any surfaces that may have blood, body fluids, and/or secretions or excretions on them.  Wear gloves when cleaning surfaces the patient has come in contact with.  Use a diluted bleach solution (e.g., dilute bleach with 1 part bleach and 10 parts water) or a household disinfectant with a label that says EPA-registered for coronaviruses. To make a bleach solution at home, add 1 tablespoon of bleach to 1 quart (4 cups) of water. For a larger supply, add  cup of bleach to 1 gallon (16 cups) of  water.  Read labels of cleaning products and follow recommendations provided on product labels. Labels contain instructions for safe and effective use of the cleaning product including precautions you should take when applying the product, such as wearing gloves or eye protection and making sure you have good ventilation during use of the product.  Remove gloves and wash hands immediately after cleaning.  Monitor yourself for signs and symptoms of illness Caregivers and household members are considered close contacts, should monitor their health, and will be asked to limit movement outside of the home to the extent possible. Follow the monitoring steps for close contacts listed on the symptom monitoring form.   ? If you have additional questions, contact your local health department or call the epidemiologist on call at [phone] (available 24/7). ? This guidance is subject to change. For the most up-to-date guidance from [facility], please refer to their website: [email or web address]

## 2019-01-23 NOTE — Progress Notes (Signed)
   Virtual Visit via Video   I connected with patient on 01/23/19 at 11:30 AM EDT by a video enabled telemedicine application and verified that I am speaking with the correct person using two identifiers.  Location patient: Home Location provider: [facility] Persons participating in the virtual visit: Patient, Provider, [place] (Patina Moore)  I discussed the limitations of evaluation and management by telemedicine and the availability of in person appointments. The patient expressed understanding and agreed to proceed.  Subjective:   HPI:   Patient presents via [facility] today c/o body aches starting Sunday with nasal congestion, rhinorrhea and new-onset fever this morning with Tmax 100.8. Endorses taking some Ibuprofen which resolved fever and is helping body aches. Denies change in taste/smell, nausea/vomiting or diarrhea. Denies recent travel or sick contact. Denies exposure to COVID.   ROS:   See pertinent positives and negatives per HPI.  Patient Active Problem List   Diagnosis Date Noted  . Plantar fasciitis 10/31/2017  . Achilles tendon rupture, right, subsequent encounter 02/07/2017  . Penile trauma 06/18/2014  . Elevated serum creatinine 05/22/2014  . Possible exposure to STD 02/08/2014  . Physical exam, annual 11/04/2011  . TESTICULAR MASS, LEFT 10/22/2009  . LENTIGO 10/01/2009    Social History   Tobacco Use  . Smoking status: Never Smoker  . Smokeless tobacco: Never Used  Substance Use Topics  . Alcohol use: No    Alcohol/week: 0.0 standard drinks   No current outpatient medications on file.  No Known Allergies  Objective:   There were no vitals taken for this visit.  Patient is well-developed, well-nourished in no acute distress.  Resting comfortably at home.  Head is normocephalic, atraumatic.  No labored breathing.  Speech is clear and coherent with logical content.  Patient is alert and oriented at baseline.   Assessment and Plan:    1. Suspected COVID-19 virus infection Went for testing this AM. Needs rule out to make sure safe to return to work. He is to quarantine until results are in. Enrolled pt in MyChart symptom monitoring program. Tylenol if needed for fever or aches. Supportive measures reviewed. Handout sent to MyChart.     Leeanne Rio, PA-C 01/23/2019

## 2019-01-25 ENCOUNTER — Encounter (INDEPENDENT_AMBULATORY_CARE_PROVIDER_SITE_OTHER): Payer: Self-pay

## 2019-01-26 ENCOUNTER — Encounter (INDEPENDENT_AMBULATORY_CARE_PROVIDER_SITE_OTHER): Payer: Self-pay

## 2019-01-26 ENCOUNTER — Encounter: Payer: Self-pay | Admitting: Physician Assistant

## 2019-01-27 ENCOUNTER — Encounter (INDEPENDENT_AMBULATORY_CARE_PROVIDER_SITE_OTHER): Payer: Self-pay

## 2019-02-20 ENCOUNTER — Encounter: Payer: Self-pay | Admitting: Family Medicine

## 2019-03-28 ENCOUNTER — Encounter: Payer: Self-pay | Admitting: Family Medicine

## 2019-04-05 ENCOUNTER — Encounter: Payer: PRIVATE HEALTH INSURANCE | Admitting: Family Medicine

## 2019-04-11 ENCOUNTER — Encounter: Payer: Self-pay | Admitting: Family Medicine

## 2019-04-11 ENCOUNTER — Ambulatory Visit (INDEPENDENT_AMBULATORY_CARE_PROVIDER_SITE_OTHER): Payer: PRIVATE HEALTH INSURANCE | Admitting: Family Medicine

## 2019-04-11 ENCOUNTER — Other Ambulatory Visit: Payer: Self-pay

## 2019-04-11 VITALS — BP 128/80 | HR 74 | Temp 97.9°F | Resp 16 | Ht 71.0 in | Wt 240.4 lb

## 2019-04-11 DIAGNOSIS — E669 Obesity, unspecified: Secondary | ICD-10-CM | POA: Diagnosis not present

## 2019-04-11 DIAGNOSIS — Z125 Encounter for screening for malignant neoplasm of prostate: Secondary | ICD-10-CM | POA: Diagnosis not present

## 2019-04-11 DIAGNOSIS — Z0000 Encounter for general adult medical examination without abnormal findings: Secondary | ICD-10-CM | POA: Diagnosis not present

## 2019-04-11 DIAGNOSIS — Z1211 Encounter for screening for malignant neoplasm of colon: Secondary | ICD-10-CM

## 2019-04-11 LAB — HEPATIC FUNCTION PANEL
ALT: 39 U/L (ref 0–53)
AST: 30 U/L (ref 0–37)
Albumin: 4.3 g/dL (ref 3.5–5.2)
Alkaline Phosphatase: 57 U/L (ref 39–117)
Bilirubin, Direct: 0.1 mg/dL (ref 0.0–0.3)
Total Bilirubin: 0.7 mg/dL (ref 0.2–1.2)
Total Protein: 7.1 g/dL (ref 6.0–8.3)

## 2019-04-11 LAB — CBC WITH DIFFERENTIAL/PLATELET
Basophils Absolute: 0.1 10*3/uL (ref 0.0–0.1)
Basophils Relative: 1.1 % (ref 0.0–3.0)
Eosinophils Absolute: 0.2 10*3/uL (ref 0.0–0.7)
Eosinophils Relative: 2.6 % (ref 0.0–5.0)
HCT: 51.4 % (ref 39.0–52.0)
Hemoglobin: 17.2 g/dL — ABNORMAL HIGH (ref 13.0–17.0)
Lymphocytes Relative: 26.1 % (ref 12.0–46.0)
Lymphs Abs: 1.7 10*3/uL (ref 0.7–4.0)
MCHC: 33.4 g/dL (ref 30.0–36.0)
MCV: 87.1 fl (ref 78.0–100.0)
Monocytes Absolute: 0.4 10*3/uL (ref 0.1–1.0)
Monocytes Relative: 6.6 % (ref 3.0–12.0)
Neutro Abs: 4.1 10*3/uL (ref 1.4–7.7)
Neutrophils Relative %: 63.6 % (ref 43.0–77.0)
Platelets: 165 10*3/uL (ref 150.0–400.0)
RBC: 5.91 Mil/uL — ABNORMAL HIGH (ref 4.22–5.81)
RDW: 14.7 % (ref 11.5–15.5)
WBC: 6.5 10*3/uL (ref 4.0–10.5)

## 2019-04-11 LAB — LIPID PANEL
Cholesterol: 225 mg/dL — ABNORMAL HIGH (ref 0–200)
HDL: 37 mg/dL — ABNORMAL LOW (ref >39.00)
LDL Cholesterol: 160 mg/dL — ABNORMAL HIGH (ref 0–99)
NonHDL: 188.18
Total CHOL/HDL Ratio: 6
Triglycerides: 139 mg/dL (ref 0.0–149.0)
VLDL: 27.8 mg/dL (ref 0.0–40.0)

## 2019-04-11 LAB — BASIC METABOLIC PANEL
BUN: 16 mg/dL (ref 6–23)
CO2: 26 mEq/L (ref 19–32)
Calcium: 9.7 mg/dL (ref 8.4–10.5)
Chloride: 103 mEq/L (ref 96–112)
Creatinine, Ser: 1.48 mg/dL (ref 0.40–1.50)
GFR: 60.87 mL/min (ref >60.00)
Glucose, Bld: 97 mg/dL (ref 70–99)
Potassium: 4 mEq/L (ref 3.5–5.1)
Sodium: 137 mEq/L (ref 135–145)

## 2019-04-11 LAB — PSA: PSA: 0.21 ng/mL (ref 0.10–4.00)

## 2019-04-11 LAB — TSH: TSH: 1.66 u[IU]/mL (ref 0.35–4.50)

## 2019-04-11 NOTE — Progress Notes (Signed)
   Subjective:    Patient ID: William Leon, male    DOB: 28-Jun-1970, 48 y.o.   MRN: [id number]  HPI CPE- UTD on Tdap, flu.  Due for colonoscopy   Review of Systems Patient reports no vision/hearing changes, anorexia, fever ,adenopathy, persistant/recurrent hoarseness, swallowing issues, chest pain, palpitations, edema, persistant/recurrent cough, hemoptysis, dyspnea (rest,exertional, paroxysmal nocturnal), gastrointestinal  bleeding (melena, rectal bleeding), abdominal pain, excessive heart burn, GU symptoms (dysuria, hematuria, voiding/incontinence issues) syncope, focal weakness, memory loss, numbness & tingling, skin/hair/nail changes, depression, anxiety, abnormal bruising/bleeding, musculoskeletal symptoms/signs.   This visit occurred during the SARS-CoV-2 public health emergency.  Safety protocols were in place, including screening questions prior to the visit, additional usage of staff PPE, and extensive cleaning of exam room while observing appropriate contact time as indicated for disinfecting solutions.       Objective:   Physical Exam BP 128/80   Pulse 74   Temp 97.9 F (36.6 C) (Tympanic)   Resp 16   Ht 5\' 11"  (1.803 m)   Wt 240 lb 6 oz (109 kg)   SpO2 98%   BMI 33.53 kg/m   General Appearance:    Alert, cooperative, no distress, appears stated age  Head:    Normocephalic, without obvious abnormality, atraumatic  Eyes:    PERRL, conjunctiva/corneas clear, EOM's intact, fundi    benign, both eyes       Ears:    Normal TM's and external ear canals, both ears  Nose:   Deferred due to COVID  Throat:   Neck:   Supple, symmetrical, trachea midline, no adenopathy;       thyroid:  No enlargement/tenderness/nodules  Back:     Symmetric, no curvature, ROM normal, no CVA tenderness  Lungs:     Clear to auscultation bilaterally, respirations unlabored  Chest wall:    No tenderness or deformity  Heart:    Regular rate and rhythm, S1 and S2 normal, no murmur, rub   or gallop    Abdomen:     Soft, non-tender, bowel sounds active all four quadrants,    no masses, no organomegaly  Genitalia:    Normal male without lesion, discharge or tenderness  Rectal:    Normal tone, normal prostate, no masses or tenderness  Extremities:   Extremities normal, atraumatic, no cyanosis or edema  Pulses:   2+ and symmetric all extremities  Skin:   Skin color, texture, turgor normal, no rashes or lesions  Lymph nodes:   Cervical, supraclavicular, and axillary nodes normal  Neurologic:   CNII-XII intact. Normal strength, sensation and reflexes      throughout          Assessment & Plan:

## 2019-04-11 NOTE — Assessment & Plan Note (Signed)
Pt's PE unchanged from previous.  UTD on Tdap.  Will refer for colonoscopy.  Check labs.  Anticipatory guidance provided.

## 2019-04-11 NOTE — Assessment & Plan Note (Signed)
Pt's BMI is misleading b/c of his muscle mass.  Check labs to risk stratify.  Will follow.

## 2019-04-11 NOTE — Patient Instructions (Signed)
Follow up in 1 year or as needed °We'll notify you of your lab results and make any changes if needed °Continue to work on healthy diet and regular exercise- you look great!! °Call with any questions or concerns °Stay Safe!  Stay Healthy! °Happy New Year!!! °

## 2019-04-16 ENCOUNTER — Encounter: Payer: Self-pay | Admitting: Gastroenterology

## 2019-04-16 ENCOUNTER — Other Ambulatory Visit: Payer: Self-pay | Admitting: General Practice

## 2019-04-16 DIAGNOSIS — E785 Hyperlipidemia, unspecified: Secondary | ICD-10-CM

## 2019-04-16 MED ORDER — ROSUVASTATIN CALCIUM 10 MG PO TABS: 10.0000 mg | ORAL_TABLET | Freq: Every day | ORAL | 6 refills | Status: DC

## 2019-04-16 MED FILL — ROSUVASTATIN CALCIUM 10 MG: 10 | 30 days supply | Qty: 30 | Fill #0

## 2019-04-17 ENCOUNTER — Encounter: Payer: Self-pay | Admitting: Family Medicine

## 2019-04-23 ENCOUNTER — Other Ambulatory Visit: Payer: Self-pay

## 2019-04-23 ENCOUNTER — Ambulatory Visit (AMBULATORY_SURGERY_CENTER): Payer: Self-pay

## 2019-04-23 VITALS — Temp 98.1°F | Ht 71.0 in | Wt 245.0 lb

## 2019-04-23 DIAGNOSIS — Z01818 Encounter for other preprocedural examination: Secondary | ICD-10-CM

## 2019-04-23 DIAGNOSIS — Z1211 Encounter for screening for malignant neoplasm of colon: Secondary | ICD-10-CM

## 2019-04-23 MED ORDER — NA SULFATE-K SULFATE-MG SULF 17.5-3.13-1.6 GM/177ML PO SOLN: 1.0000 | Freq: Once | ORAL | 0 refills | Status: AC

## 2019-04-23 MED FILL — SUPREP BOWEL PREP KIT: 17.5-3.13-1 | 1 days supply | Qty: 354 | Fill #0

## 2019-04-23 NOTE — Progress Notes (Signed)

## 2019-05-03 ENCOUNTER — Ambulatory Visit (INDEPENDENT_AMBULATORY_CARE_PROVIDER_SITE_OTHER): Payer: PRIVATE HEALTH INSURANCE

## 2019-05-03 ENCOUNTER — Other Ambulatory Visit: Payer: Self-pay | Admitting: Gastroenterology

## 2019-05-03 DIAGNOSIS — Z1159 Encounter for screening for other viral diseases: Secondary | ICD-10-CM

## 2019-05-03 LAB — SARS CORONAVIRUS 2 (TAT 6-24 HRS): SARS Coronavirus 2: NEGATIVE

## 2019-05-04 ENCOUNTER — Ambulatory Visit (AMBULATORY_SURGERY_CENTER): Payer: PRIVATE HEALTH INSURANCE | Admitting: Gastroenterology

## 2019-05-04 ENCOUNTER — Other Ambulatory Visit: Payer: Self-pay

## 2019-05-04 ENCOUNTER — Encounter: Payer: Self-pay | Admitting: Gastroenterology

## 2019-05-04 VITALS — BP 134/65 | HR 64 | Temp 98.8°F | Resp 15 | Ht 71.0 in | Wt 245.0 lb

## 2019-05-04 DIAGNOSIS — Z1211 Encounter for screening for malignant neoplasm of colon: Secondary | ICD-10-CM

## 2019-05-04 DIAGNOSIS — D123 Benign neoplasm of transverse colon: Secondary | ICD-10-CM | POA: Diagnosis not present

## 2019-05-04 DIAGNOSIS — D122 Benign neoplasm of ascending colon: Secondary | ICD-10-CM | POA: Diagnosis not present

## 2019-05-04 DIAGNOSIS — D120 Benign neoplasm of cecum: Secondary | ICD-10-CM

## 2019-05-04 MED ORDER — SODIUM CHLORIDE 0.9 % IV SOLN: 500.0000 mL | Freq: Once | INTRAVENOUS | Status: DC

## 2019-05-04 NOTE — Patient Instructions (Addendum)
YOU HAD AN ENDOSCOPIC PROCEDURE TODAY AT [facility]:   Refer to the procedure report that was given to you for any specific questions about what was found during the examination.  If the procedure report does not answer your questions, please call your gastroenterologist to clarify.  If you requested that your care partner not be given the details of your procedure findings, then the procedure report has been included in a sealed envelope for you to review at your convenience later.  YOU SHOULD EXPECT: Some feelings of bloating in the abdomen. Passage of more gas than usual.  Walking can help get rid of the air that was put into your GI tract during the procedure and reduce the bloating. If you had a lower endoscopy (such as a colonoscopy or flexible sigmoidoscopy) you may notice spotting of blood in your stool or on the toilet paper. If you underwent a bowel prep for your procedure, you may not have a normal bowel movement for a few days.  Please Note:  You might notice some irritation and congestion in your nose or some drainage.  This is from the oxygen used during your procedure.  There is no need for concern and it should clear up in a day or so.  SYMPTOMS TO REPORT IMMEDIATELY:   Following lower endoscopy (colonoscopy or flexible sigmoidoscopy):  Excessive amounts of blood in the stool  Significant tenderness or worsening of abdominal pains  Swelling of the abdomen that is new, acute  Fever of 100F or higher    For urgent or emergent issues, a gastroenterologist can be reached at any hour by calling [phone].   DIET:  We do recommend a small meal at first, but then you may proceed to your regular diet.  Drink plenty of fluids but you should avoid alcoholic beverages for 24 hours.  ACTIVITY:  You should plan to take it easy for the rest of today and you should NOT DRIVE or use heavy machinery until tomorrow (because of the sedation medicines used during the test).     FOLLOW UP: Our staff will call the number listed on your records 48-72 hours following your procedure to check on you and address any questions or concerns that you may have regarding the information given to you following your procedure. If we do not reach you, we will leave a message.  We will attempt to reach you two times.  During this call, we will ask if you have developed any symptoms of COVID 19. If you develop any symptoms (ie: fever, flu-like symptoms, shortness of breath, cough etc.) before then, please call [phone].  If you test positive for Covid 19 in the 2 weeks post procedure, please call and report this information to [place].    If any biopsies were taken you will be contacted by phone or by letter within the next 1-3 weeks.  Please call us at [phone] if you have not heard about the biopsies in 3 weeks.    SIGNATURES/CONFIDENTIALITY: You and/or your care partner have signed paperwork which will be entered into your electronic medical record.  These signatures attest to the fact that that the information above on your After Visit Summary has been reviewed and is understood.  Full responsibility of the confidentiality of this discharge information lies with you and/or your care-partner.   Resume medications. Information given on polyps, Diverticulosis and hemorrhoids.

## 2019-05-04 NOTE — Op Note (Signed)
[place] Patient Name: William Leon Procedure Date: 05/04/2019 1:34 PM MRN: [id number] Endoscopist: Remo Lipps P. Havery Moros , MD Age: 49 Referring MD:  Date of Birth: January 07, 1971 Gender: Male Account #: [id number] Procedure:                Colonoscopy Indications:              Screening for colorectal malignant neoplasm, This                            is the patient's first colonoscopy Medicines:                Monitored Anesthesia Care Procedure:                Pre-Anesthesia Assessment:                           - Prior to the procedure, a History and Physical                            was performed, and patient medications and                            allergies were reviewed. The patient's tolerance of                            previous anesthesia was also reviewed. The risks                            and benefits of the procedure and the sedation                            options and risks were discussed with the patient.                            All questions were answered, and informed consent                            was obtained. Prior Anticoagulants: The patient has                            taken no previous anticoagulant or antiplatelet                            agents. ASA Grade Assessment: II - A patient with                            mild systemic disease. After reviewing the risks                            and benefits, the patient was deemed in                            satisfactory condition to undergo the procedure.  After obtaining informed consent, the colonoscope                            was passed under direct vision. Throughout the                            procedure, the patient's blood pressure, pulse, and                            oxygen saturations were monitored continuously. The                            Colonoscope was introduced through the anus and                            advanced to the the  cecum, identified by                            appendiceal orifice and ileocecal valve. The                            colonoscopy was performed without difficulty. The                            patient tolerated the procedure well. The quality                            of the bowel preparation was good. The ileocecal                            valve, appendiceal orifice, and rectum were                            photographed. Scope In: 1:35:51 PM Scope Out: 1:58:23 PM Scope Withdrawal Time: 0 hours 18 minutes 45 seconds  Total Procedure Duration: 0 hours 22 minutes 32 seconds  Findings:                 The perianal and digital rectal examinations were                            normal.                           The terminal ileum appeared normal.                           A 8 to 10 mm polyp was found in the cecum. The                            polyp was sessile. The polyp was removed with a                            cold snare. Resection and retrieval were complete.  Four sessile polyps were found in the ascending                            colon. The polyps were 3 to 7 mm in size. These                            polyps were removed with a cold snare. Resection                            and retrieval were complete.                           A 3 mm polyp was found in the transverse colon. The                            polyp was sessile. The polyp was removed with a                            cold snare. Resection and retrieval were complete.                           Multiple small-mouthed diverticula were found in                            the sigmoid colon.                           A 3 mm polyp was found in the rectum. The polyp was                            sessile. The polyp was removed with a cold snare.                            Resection and retrieval were complete.                           Internal hemorrhoids were found during  retroflexion.                           The exam was otherwise without abnormality. Complications:            No immediate complications. Estimated blood loss:                            Minimal. Estimated Blood Loss:     Estimated blood loss was minimal. Impression:               - The examined portion of the ileum was normal.                           - One 8 to 10 mm polyp in the cecum, removed with a  cold snare. Resected and retrieved.                           - Four 3 to 7 mm polyps in the ascending colon,                            removed with a cold snare. Resected and retrieved.                           - One 3 mm polyp in the transverse colon, removed                            with a cold snare. Resected and retrieved.                           - Diverticulosis in the sigmoid colon.                           - One 3 mm polyp in the rectum, removed with a cold                            snare. Resected and retrieved.                           - Internal hemorrhoids.                           - The examination was otherwise normal. Recommendation:           - Patient has a contact number available for                            emergencies. The signs and symptoms of potential                            delayed complications were discussed with the                            patient. Return to normal activities tomorrow.                            Written discharge instructions were provided to the                            patient.                           - Resume previous diet.                           - Continue present medications.                           - Await pathology results. Remo Lipps P. Havery Moros, MD 05/04/2019 2:05:35 PM This report has been signed electronically.

## 2019-05-04 NOTE — Progress Notes (Signed)
A/ox3, pleased with MAC, report to RN 

## 2019-05-04 NOTE — Progress Notes (Signed)
Temperature- William Leon  Pt's states no medical or surgical changes since previsit or office visit.

## 2019-05-08 ENCOUNTER — Telehealth: Payer: Self-pay

## 2019-05-08 NOTE — Telephone Encounter (Signed)
  Follow up Call-  Call back number 05/04/2019  Post procedure Call Back phone  # [phone]  Permission to leave phone message Yes  Some recent data might be hidden     Patient questions:  Do you have a fever, pain , or abdominal swelling? No. Pain Score  0 *  Have you tolerated food without any problems? Yes.    Have you been able to return to your normal activities? Yes.    Do you have any questions about your discharge instructions: Diet   No. Medications  No. Follow up visit  No.  Do you have questions or concerns about your Care? No.  Actions: * If pain score is 4 or above: No action needed, pain <4.  Have you developed a fever since your procedure? No 2.   Have you had an respiratory symptoms (SOB or cough) since your procedure? No  3.   Have you tested positive for COVID 19 since your procedure No  4.   Have you had any family members/close contacts diagnosed with the COVID 19 since your procedure?  No   If yes to any of these questions please route to Joylene John, RN and Alphonsa Gin, RN.

## 2019-05-23 ENCOUNTER — Encounter: Payer: Self-pay | Admitting: Family Medicine

## 2019-05-23 ENCOUNTER — Other Ambulatory Visit: Payer: Self-pay

## 2019-05-23 ENCOUNTER — Ambulatory Visit (INDEPENDENT_AMBULATORY_CARE_PROVIDER_SITE_OTHER): Payer: PRIVATE HEALTH INSURANCE | Admitting: *Deleted

## 2019-05-23 ENCOUNTER — Telehealth: Payer: Self-pay | Admitting: General Practice

## 2019-05-23 ENCOUNTER — Other Ambulatory Visit: Payer: Self-pay | Admitting: General Practice

## 2019-05-23 DIAGNOSIS — E785 Hyperlipidemia, unspecified: Secondary | ICD-10-CM

## 2019-05-23 LAB — HEPATIC FUNCTION PANEL
ALT: 28 U/L (ref 0–53)
AST: 27 U/L (ref 0–37)
Albumin: 4.1 g/dL (ref 3.5–5.2)
Alkaline Phosphatase: 59 U/L (ref 39–117)
Bilirubin, Direct: 0.2 mg/dL (ref 0.0–0.3)
Total Bilirubin: 0.7 mg/dL (ref 0.2–1.2)
Total Protein: 7 g/dL (ref 6.0–8.3)

## 2019-05-23 MED ORDER — ROSUVASTATIN CALCIUM 10 MG PO TABS: 10.0000 mg | ORAL_TABLET | Freq: Every day | ORAL | 1 refills | Status: DC

## 2019-05-23 MED FILL — ROSUVASTATIN CALCIUM 10 MG: 10 | 90 days supply | Qty: 90 | Fill #0

## 2019-05-23 NOTE — Telephone Encounter (Signed)
Pt viewed results in mychart. New Rx for cholesterol medication #90 sent to pharmacy. Pt informed. Accidentally closed encounter before documenting.

## 2019-08-03 MED FILL — PENICILLIN VK 500 MG TABLET: 500 | 7 days supply | Qty: 28 | Fill #0

## 2019-08-27 MED FILL — IBUPROFEN 800 MG TAB: 800 | 5 days supply | Qty: 20 | Fill #0

## 2020-02-18 ENCOUNTER — Encounter (HOSPITAL_COMMUNITY): Payer: Self-pay

## 2020-02-18 ENCOUNTER — Telehealth: Payer: Self-pay | Admitting: Gastroenterology

## 2020-02-18 ENCOUNTER — Inpatient Hospital Stay (HOSPITAL_COMMUNITY)
Admission: EM | Admit: 2020-02-18 | Discharge: 2020-02-20 | DRG: 378 | Disposition: A | Discharge status: Home / Self Care | Payer: PRIVATE HEALTH INSURANCE | Attending: Internal Medicine | Admitting: Internal Medicine

## 2020-02-18 ENCOUNTER — Other Ambulatory Visit: Payer: Self-pay

## 2020-02-18 DIAGNOSIS — Z8719 Personal history of other diseases of the digestive system: Secondary | ICD-10-CM

## 2020-02-18 DIAGNOSIS — T39395A Adverse effect of other nonsteroidal anti-inflammatory drugs [NSAID], initial encounter: Secondary | ICD-10-CM | POA: Diagnosis present

## 2020-02-18 DIAGNOSIS — K254 Chronic or unspecified gastric ulcer with hemorrhage: Principal | ICD-10-CM | POA: Diagnosis present

## 2020-02-18 DIAGNOSIS — E559 Vitamin D deficiency, unspecified: Secondary | ICD-10-CM | POA: Diagnosis present

## 2020-02-18 DIAGNOSIS — E669 Obesity, unspecified: Secondary | ICD-10-CM | POA: Diagnosis present

## 2020-02-18 DIAGNOSIS — K921 Melena: Secondary | ICD-10-CM

## 2020-02-18 DIAGNOSIS — K2970 Gastritis, unspecified, without bleeding: Secondary | ICD-10-CM | POA: Diagnosis present

## 2020-02-18 DIAGNOSIS — Z23 Encounter for immunization: Secondary | ICD-10-CM

## 2020-02-18 DIAGNOSIS — Z20822 Contact with and (suspected) exposure to covid-19: Secondary | ICD-10-CM | POA: Diagnosis present

## 2020-02-18 DIAGNOSIS — D62 Acute posthemorrhagic anemia: Secondary | ICD-10-CM | POA: Diagnosis present

## 2020-02-18 DIAGNOSIS — K2980 Duodenitis without bleeding: Secondary | ICD-10-CM | POA: Diagnosis present

## 2020-02-18 DIAGNOSIS — E663 Overweight: Secondary | ICD-10-CM | POA: Diagnosis present

## 2020-02-18 DIAGNOSIS — J302 Other seasonal allergic rhinitis: Secondary | ICD-10-CM | POA: Diagnosis present

## 2020-02-18 DIAGNOSIS — R7989 Other specified abnormal findings of blood chemistry: Secondary | ICD-10-CM | POA: Diagnosis present

## 2020-02-18 DIAGNOSIS — E785 Hyperlipidemia, unspecified: Secondary | ICD-10-CM | POA: Diagnosis present

## 2020-02-18 DIAGNOSIS — K922 Gastrointestinal hemorrhage, unspecified: Secondary | ICD-10-CM | POA: Diagnosis not present

## 2020-02-18 DIAGNOSIS — D649 Anemia, unspecified: Secondary | ICD-10-CM

## 2020-02-18 DIAGNOSIS — K449 Diaphragmatic hernia without obstruction or gangrene: Secondary | ICD-10-CM | POA: Diagnosis present

## 2020-02-18 DIAGNOSIS — Z6834 Body mass index (BMI) 34.0-34.9, adult: Secondary | ICD-10-CM

## 2020-02-18 DIAGNOSIS — Z79899 Other long term (current) drug therapy: Secondary | ICD-10-CM

## 2020-02-18 DIAGNOSIS — D72829 Elevated white blood cell count, unspecified: Secondary | ICD-10-CM | POA: Diagnosis present

## 2020-02-18 LAB — COMPREHENSIVE METABOLIC PANEL
ALT: 33 U/L (ref 0–44)
AST: 26 U/L (ref 15–41)
Albumin: 3.9 g/dL (ref 3.5–5.0)
Alkaline Phosphatase: 40 U/L (ref 38–126)
Anion gap: 6 (ref 5–15)
BUN: 47 mg/dL — ABNORMAL HIGH (ref 6–20)
CO2: 25 mmol/L (ref 22–32)
Calcium: 8.5 mg/dL — ABNORMAL LOW (ref 8.9–10.3)
Chloride: 105 mmol/L (ref 98–111)
Creatinine, Ser: 1.45 mg/dL — ABNORMAL HIGH (ref 0.61–1.24)
GFR, Estimated: 59 mL/min — ABNORMAL LOW (ref >60)
Glucose, Bld: 135 mg/dL — ABNORMAL HIGH (ref 70–99)
Potassium: 4.7 mmol/L (ref 3.5–5.1)
Sodium: 136 mmol/L (ref 135–145)
Total Bilirubin: 0.8 mg/dL (ref 0.3–1.2)
Total Protein: 6.5 g/dL (ref 6.5–8.1)

## 2020-02-18 LAB — CBC WITH DIFFERENTIAL/PLATELET
Abs Immature Granulocytes: 0.05 10*3/uL (ref 0.00–0.07)
Basophils Absolute: 0 10*3/uL (ref 0.0–0.1)
Basophils Relative: 0 %
Eosinophils Absolute: 0 10*3/uL (ref 0.0–0.5)
Eosinophils Relative: 0 %
HCT: 42.1 % (ref 39.0–52.0)
Hemoglobin: 13.8 g/dL (ref 13.0–17.0)
Immature Granulocytes: 0 %
Lymphocytes Relative: 19 %
Lymphs Abs: 2.3 10*3/uL (ref 0.7–4.0)
MCH: 29.6 pg (ref 26.0–34.0)
MCHC: 32.8 g/dL (ref 30.0–36.0)
MCV: 90.3 fL (ref 80.0–100.0)
Monocytes Absolute: 1 10*3/uL (ref 0.1–1.0)
Monocytes Relative: 8 %
Neutro Abs: 8.6 10*3/uL — ABNORMAL HIGH (ref 1.7–7.7)
Neutrophils Relative %: 73 %
Platelets: 201 10*3/uL (ref 150–400)
RBC: 4.66 MIL/uL (ref 4.22–5.81)
RDW: 13.9 % (ref 11.5–15.5)
WBC: 12 10*3/uL — ABNORMAL HIGH (ref 4.0–10.5)
nRBC: 0 % (ref 0.0–0.2)

## 2020-02-18 LAB — PROTIME-INR
INR: 1.1 (ref 0.8–1.2)
Prothrombin Time: 13.6 seconds (ref 11.4–15.2)

## 2020-02-18 LAB — CBC
HCT: 41.3 % (ref 39.0–52.0)
Hemoglobin: 13.5 g/dL (ref 13.0–17.0)
MCH: 29.3 pg (ref 26.0–34.0)
MCHC: 32.7 g/dL (ref 30.0–36.0)
MCV: 89.6 fL (ref 80.0–100.0)
Platelets: 181 10*3/uL (ref 150–400)
RBC: 4.61 MIL/uL (ref 4.22–5.81)
RDW: 13.8 % (ref 11.5–15.5)
WBC: 12.7 10*3/uL — ABNORMAL HIGH (ref 4.0–10.5)
nRBC: 0 % (ref 0.0–0.2)

## 2020-02-18 LAB — RESPIRATORY PANEL BY RT PCR (FLU A&B, COVID)
Influenza A by PCR: NEGATIVE
Influenza B by PCR: NEGATIVE
SARS Coronavirus 2 by RT PCR: NEGATIVE

## 2020-02-18 LAB — POC OCCULT BLOOD, ED: Fecal Occult Bld: POSITIVE — AB

## 2020-02-18 MED ORDER — ONDANSETRON HCL 4 MG PO TABS: 4.0000 mg | ORAL_TABLET | Freq: Four times a day (QID) | ORAL | Status: DC | PRN

## 2020-02-18 MED ORDER — PANTOPRAZOLE SODIUM 40 MG IV SOLR
40.0000 mg | Freq: Two times a day (BID) | INTRAVENOUS | Status: DC
  Administered 2020-02-19 – 2020-02-20 (×3): 40 mg via INTRAVENOUS
  Filled 2020-02-18 (×3): qty 40

## 2020-02-18 MED ORDER — SODIUM CHLORIDE 0.9 % IV SOLN: INTRAVENOUS | Status: DC

## 2020-02-18 MED ORDER — SODIUM CHLORIDE 0.9 % IV SOLN
80.0000 mg | Freq: Once | INTRAVENOUS | Status: AC
  Administered 2020-02-18: 23:00:00 80 mg via INTRAVENOUS
  Filled 2020-02-18 (×2): qty 80

## 2020-02-18 MED ORDER — ACETAMINOPHEN 325 MG PO TABS: 650.0000 mg | ORAL_TABLET | Freq: Four times a day (QID) | ORAL | Status: DC | PRN

## 2020-02-18 MED ORDER — ACETAMINOPHEN 650 MG RE SUPP: 650.0000 mg | Freq: Four times a day (QID) | RECTAL | Status: DC | PRN

## 2020-02-18 MED ORDER — SODIUM CHLORIDE 0.9 % IV BOLUS
1000.0000 mL | Freq: Once | INTRAVENOUS | Status: AC
  Administered 2020-02-18: 1000 mL via INTRAVENOUS

## 2020-02-18 MED ORDER — ONDANSETRON HCL 4 MG/2ML IJ SOLN: 4.0000 mg | Freq: Four times a day (QID) | INTRAMUSCULAR | Status: DC | PRN

## 2020-02-18 NOTE — Telephone Encounter (Signed)
I called the patient upon receiving this message.  He went to [place] [employer] [place] yesterday and states he ate a bunch of food that did not agree with his stomach yesterday.  This morning he started having black tarry stools, this persisted.  He thought he would take a laxative to c"lear himself out" but he has continued to have black tarry stools today.  No vomiting.  He is feeling lightheaded and dizzy and just not himself.  He denies any ingestion of food, iron, or Pepto-Bismol that would account for this.  I am concerned he could be having an upper GI bleed.  I discussed my concerns with him.  I recommend he go to the nearest emergency room to be evaluated for the symptoms.  I asked him to get family or friend to drive him.  He agreed and will seek further evaluation given these ongoing symptoms.  [employer]

## 2020-02-18 NOTE — H&P (Signed)
History and Physical    Faron Tudisco WUJ:[id number] DOB: [birth year] DOA: 02/18/2020  PCP: Midge Minium, MD  Patient coming from: Home.  I have personally briefly reviewed patient's old medical records in [place]  Chief Complaint: Dark tarry stools and lightheadedness.  HPI: William Leon is a 49 y.o. male with medical history significant of seasonal allergies, hyperlipidemia who is coming to the emergency department due to having episodes of dark tarry stools associated with lightheadedness after he ate some "bad food" yesterday.  He denies fever, chills, diarrhea, constipation or hematochezia.  No dysuria, frequency or hematuria.  He denies chest pain, palpitations, diaphoresis, PND, orthopnea or pitting edema of the lower extremities.  No polyuria, polydipsia, polyphagia or blurred vision.  ED Course: Initial vital signs were temperature 99 F, pulse 115, respiration 18, blood pressure 140/79 mmHg O2 sat 100% on room air.  He received a 1000 mL NS bolus and 80 mg of pantoprazole IVPB.  Lab work: Fecal occult blood was positive.  CBC showed a white count of 12.0 and INR 13.8 g/dL platelets were 201.  CMP showed normal electrolytes except for calcium of 8.5 mg/dL.  Glucose 135, BUN 47 and creatinine 1.45 mg/dL.  GFR was 59 mL/min.  LFTs were normal.  Review of Systems: As per HPI otherwise all other systems reviewed and are negative.  Past Medical History:  Diagnosis Date  . Allergy    seasonal-ragweed  . Hyperlipidemia     Past Surgical History:  Procedure Laterality Date  . ACHILLES TENDON SURGERY Right 01/2017  . NO PAST SURGERIES      Social History  reports that he has never smoked. He has never used smokeless tobacco. He reports that he does not drink alcohol and does not use drugs.  No Known Allergies  Family History  Problem Relation Age of Onset  . Colon cancer Neg Hx   . Colon polyps Neg Hx   . Esophageal cancer Neg Hx   . Rectal cancer Neg Hx   .  Stomach cancer Neg Hx    Prior to Admission medications   Medication Sig Start Date End Date Taking? Authorizing Provider  rosuvastatin (CRESTOR) 10 MG tablet Take 1 tablet (10 mg total) by mouth daily. Patient not taking: Reported on 02/18/2020 05/23/19   Midge Minium, MD    Physical Exam: Vitals:   02/18/20 2100 02/18/20 2200 02/18/20 2215 02/18/20 2300  BP: (!) 144/80 125/64  120/78  Pulse: (!) 101 (!) 111 (!) 110 92  Resp: 16 20 20 18   Temp:      TempSrc:      SpO2: 100% 100% 99% 99%  Weight:      Height:        Constitutional: NAD, calm, comfortable Eyes: PERRL, lids and conjunctivae normal ENMT: Mucous membranes are moist. Posterior pharynx clear of any exudate or lesions. Neck: normal, supple, no masses, no thyromegaly Respiratory: clear to auscultation bilaterally, no wheezing, no crackles. Normal respiratory effort. No accessory muscle use.  Cardiovascular: Regular rate and rhythm, no murmurs / rubs / gallops. No extremity edema. 2+ pedal pulses. No carotid bruits.  Abdomen: Obese, nondistended.  BS positive.  Soft, no tenderness, no masses palpated. No hepatosplenomegaly.  Musculoskeletal: no clubbing / cyanosis. Good ROM, no contractures. Normal muscle tone.  Skin: no rashes, lesions, ulcers on limited dermatological examination. Neurologic: CN 2-12 grossly intact. Sensation intact, DTR normal. Strength 5/5 in all 4.  Psychiatric: Normal judgment and insight. Alert and oriented x  3. Normal mood.   Labs on Admission: I have personally reviewed following labs and imaging studies  CBC: Recent Labs  Lab 02/18/20 1918 02/18/20 2144  WBC 12.0* 12.7*  NEUTROABS 8.6*  --   HGB 13.8 13.5  HCT 42.1 41.3  MCV 90.3 89.6  PLT 201 646   Basic Metabolic Panel: Recent Labs  Lab 02/18/20 1918  NA 136  K 4.7  CL 105  CO2 25  GLUCOSE 135*  BUN 47*  CREATININE 1.45*  CALCIUM 8.5*   GFR: Estimated Creatinine Clearance: 76.5 mL/min (A) (by C-G formula based on  SCr of 1.45 mg/dL (H)).  Liver Function Tests: Recent Labs  Lab 02/18/20 1918  AST 26  ALT 33  ALKPHOS 40  BILITOT 0.8  PROT 6.5  ALBUMIN 3.9   Radiological Exams on Admission: No results found.  EKG: Independently reviewed.  Assessment/Plan Principal Problem:   Melena Observation/telemetry. Keep n.p.o. Continue IV fluids per Continue pantoprazole 40 mg IVPB every 12 hours. Dr. Kathrynn Humble messaged Dr. Hilarie Fredrickson to add to GI list.  Active Problems:   Hyperlipidemia Currently not taking rosuvastatin.    Azotemia In the setting of GI bleed. Monitor renal function.    Overweight BMI is 33.47 kg/m, but muscular. Lifestyle modifications. Follow-up with PCP.    Leukocytosis In the setting of acute illness. No fever or signs of infection. Follow WBC.    Hypocalcemia Vitamin D deficiency? Recheck calcium level in a.m. If still low, consider further work-up.   DVT prophylaxis: SCDs. Code Status:   Full code. Family Communication: Disposition Plan:   Patient is from:  Home.  Anticipated DC to:  Home.  Anticipated DC date:  02/19/2020 or 02/20/2020  Anticipated DC barriers: Clinical and procedure status.  Consults called:   Admission status:  Observation/telemetry.   Severity of Illness: Medium to high due to new onset melena with episodes of dizziness.  Reubin Milan MD [facility]  How to contact the [facility] Attending or Consulting provider [place] or covering provider during after hours [place], for this patient?   1. Check the care team in [facility] and look for a) attending/consulting [facility] provider listed and b) the [facility] team listed 2. Log into [email or web address] and use 's universal password to access. If you do not have the password, please contact the hospital operator. 3. Locate the [facility] provider you are looking for under [facility] and page to a number that you can be directly reached. 4. If you still have difficulty reaching the provider,  please page the [facility] (Director on Call) for the Hospitalists listed on amion for assistance.  02/18/2020, 11:47 PM   This document was prepared using Dragon voice recognition software and may contain some unintended transcription errors.

## 2020-02-18 NOTE — Telephone Encounter (Signed)
Spoke with patient, he reports that he is having black tarry stools that began this morning, diarrhea this morning as well. Pt states that he had eaten hot dogs, popcorn, chicken tenders yesterday and symptoms began after that. No abdominal pain, no vomiting, pt states that when he lied down he felt like the food was coming back up - possibly reflux. No iron supplements or pepto-bismol recently. Pt has not been seen in the office - would he need to be scheduled as a new patient or a follow up? Please advise on further recommendations, thank you.

## 2020-02-18 NOTE — ED Provider Notes (Signed)
[facility] DEPT Provider Note   CSN: [id number] Arrival date & time: 02/18/20  1845     History Chief Complaint  Patient presents with  . Rectal Bleeding    William Leon is a 49 y.o. male.  HPI     49 year old comes in a chief complaint of rectal bleeding. Patient reports that he started having diarrhea like illness today.  He thought that it was food that he might have had yesterday that was causing it.  He took laxatives to further alleviate himself.  Then he started noticing dark tarry stools.  He has had multiple episodes of dark tarry stools and decided to come to the ER.  Patient does not take Pepto-Bismol or iron.  He had some abdominal discomfort earlier but none currently.  Patient is under a lot of stress.  He denies taking NSAIDs, smoking, heavy alcohol use.  No family history of IBD.  Past Medical History:  Diagnosis Date  . Allergy    seasonal-ragweed  . Hyperlipidemia     Patient Active Problem List   Diagnosis Date Noted  . Melena 02/18/2020  . Obesity (BMI 30-39.9) 04/11/2019  . Plantar fasciitis 10/31/2017  . Achilles tendon rupture, right, subsequent encounter 02/07/2017  . Penile trauma 06/18/2014  . Elevated serum creatinine 05/22/2014  . Possible exposure to STD 02/08/2014  . Physical exam, annual 11/04/2011  . TESTICULAR MASS, LEFT 10/22/2009  . LENTIGO 10/01/2009    Past Surgical History:  Procedure Laterality Date  . ACHILLES TENDON SURGERY Right 01/2017  . NO PAST SURGERIES         Family History  Problem Relation Age of Onset  . Colon cancer Neg Hx   . Colon polyps Neg Hx   . Esophageal cancer Neg Hx   . Rectal cancer Neg Hx   . Stomach cancer Neg Hx     Social History   Tobacco Use  . Smoking status: Never Smoker  . Smokeless tobacco: Never Used  Vaping Use  . Vaping Use: Never used  Substance Use Topics  . Alcohol use: No    Alcohol/week: 0.0 standard drinks  . Drug use: No    Home  Medications Prior to Admission medications   Medication Sig Start Date End Date Taking? Authorizing Provider  rosuvastatin (CRESTOR) 10 MG tablet Take 1 tablet (10 mg total) by mouth daily. Patient not taking: Reported on 02/18/2020 05/23/19   Midge Minium, MD    Allergies    Patient has no known allergies.  Review of Systems   Review of Systems  Constitutional: Positive for activity change.  Respiratory: Negative for shortness of breath.   Cardiovascular: Negative for chest pain.  Gastrointestinal: Positive for abdominal pain, blood in stool and diarrhea. Negative for nausea and vomiting.  All other systems reviewed and are negative.   Physical Exam Updated Vital Signs BP 120/78   Pulse 92   Temp 99 F (37.2 C) (Oral)   Resp 18   Ht 5\' 11"  (1.803 m)   Wt 108.9 kg   SpO2 99%   BMI 33.47 kg/m   Physical Exam Vitals and nursing note reviewed.  Constitutional:      Appearance: He is well-developed.  HENT:     Head: Atraumatic.  Eyes:     Extraocular Movements: Extraocular movements intact.     Pupils: Pupils are equal, round, and reactive to light.  Cardiovascular:     Rate and Rhythm: Tachycardia present.  Pulmonary:  Effort: Pulmonary effort is normal.  Genitourinary:    Rectum: Guaiac result positive.     Comments: Dark tarry stools Musculoskeletal:     Cervical back: Neck supple.  Skin:    General: Skin is warm.  Neurological:     Mental Status: He is alert and oriented to person, place, and time.     ED Results / Procedures / Treatments   Labs (all labs ordered are listed, but only abnormal results are displayed) Labs Reviewed  CBC WITH DIFFERENTIAL/PLATELET - Abnormal; Notable for the following components:      Result Value   WBC 12.0 (*)    Neutro Abs 8.6 (*)    All other components within normal limits  COMPREHENSIVE METABOLIC PANEL - Abnormal; Notable for the following components:   Glucose, Bld 135 (*)    BUN 47 (*)    Creatinine,  Ser 1.45 (*)    Calcium 8.5 (*)    GFR, Estimated 59 (*)    All other components within normal limits  CBC - Abnormal; Notable for the following components:   WBC 12.7 (*)    All other components within normal limits  POC OCCULT BLOOD, ED - Abnormal; Notable for the following components:   Fecal Occult Bld POSITIVE (*)    All other components within normal limits  RESPIRATORY PANEL BY RT PCR (FLU A&B, COVID)  PROTIME-INR    EKG None  Radiology No results found.  Procedures Procedures (including critical care time)  Medications Ordered in ED Medications  sodium chloride 0.9 % bolus 1,000 mL (1,000 mLs Intravenous New Bag/Given (Non-Interop) 02/18/20 2149)  pantoprazole (PROTONIX) 80 mg in sodium chloride 0.9 % 100 mL IVPB (80 mg Intravenous New Bag/Given 02/18/20 2236)    ED Course  I have reviewed the triage vital signs and the nursing notes.  Pertinent labs & imaging results that were available during my care of the patient were reviewed by me and considered in my medical decision making (see chart for details).    MDM Rules/Calculators/A&P                          49 year old comes in a chief complaint of melena.  No abdominal pain.  No risk factors for upper GI bleed and patient overall is healthy.  He does have dark tarry stools that are guaiac positive.  Patient is noted to be tachycardic.  His hemoglobin is about 3 g lower than his baseline from 2019.  Repeat CBC ordered and it is still at 13.5.  Patient is noted to have elevated BUN.  Could be because of dehydration given his diarrhea, other possibility includes upper GI bleed.  At this time I am not comfortable sending patient home given that he has tarry stools, that are guaiac positive with elevated BUN and drop in hemoglobin.  We will admit him.  Final Clinical Impression(s) / ED Diagnoses Final diagnoses:  Melena    Rx / DC Orders ED Discharge Orders    None       Varney Biles, MD 02/18/20 2340

## 2020-02-18 NOTE — ED Triage Notes (Addendum)
Pt reports dark tarry stool after eating bad food yesterday and light  headed.  Pt directed here by PCP.

## 2020-02-19 ENCOUNTER — Encounter (HOSPITAL_COMMUNITY)
Admission: EM | Disposition: A | Discharge status: Home / Self Care | Payer: Self-pay | Source: Home / Self Care | Attending: Internal Medicine

## 2020-02-19 ENCOUNTER — Encounter (HOSPITAL_COMMUNITY): Payer: Self-pay | Admitting: Internal Medicine

## 2020-02-19 ENCOUNTER — Observation Stay (HOSPITAL_COMMUNITY): Payer: PRIVATE HEALTH INSURANCE | Admitting: Certified Registered"

## 2020-02-19 DIAGNOSIS — Z8719 Personal history of other diseases of the digestive system: Secondary | ICD-10-CM | POA: Diagnosis not present

## 2020-02-19 DIAGNOSIS — E785 Hyperlipidemia, unspecified: Secondary | ICD-10-CM

## 2020-02-19 DIAGNOSIS — T39395A Adverse effect of other nonsteroidal anti-inflammatory drugs [NSAID], initial encounter: Secondary | ICD-10-CM | POA: Diagnosis present

## 2020-02-19 DIAGNOSIS — K2970 Gastritis, unspecified, without bleeding: Secondary | ICD-10-CM

## 2020-02-19 DIAGNOSIS — J302 Other seasonal allergic rhinitis: Secondary | ICD-10-CM | POA: Diagnosis present

## 2020-02-19 DIAGNOSIS — K922 Gastrointestinal hemorrhage, unspecified: Secondary | ICD-10-CM | POA: Diagnosis present

## 2020-02-19 DIAGNOSIS — E663 Overweight: Secondary | ICD-10-CM | POA: Diagnosis present

## 2020-02-19 DIAGNOSIS — D72829 Elevated white blood cell count, unspecified: Secondary | ICD-10-CM

## 2020-02-19 DIAGNOSIS — Z20822 Contact with and (suspected) exposure to covid-19: Secondary | ICD-10-CM | POA: Diagnosis present

## 2020-02-19 DIAGNOSIS — K449 Diaphragmatic hernia without obstruction or gangrene: Secondary | ICD-10-CM | POA: Diagnosis present

## 2020-02-19 DIAGNOSIS — K259 Gastric ulcer, unspecified as acute or chronic, without hemorrhage or perforation: Secondary | ICD-10-CM

## 2020-02-19 DIAGNOSIS — D649 Anemia, unspecified: Secondary | ICD-10-CM

## 2020-02-19 DIAGNOSIS — K254 Chronic or unspecified gastric ulcer with hemorrhage: Secondary | ICD-10-CM | POA: Diagnosis present

## 2020-02-19 DIAGNOSIS — K921 Melena: Secondary | ICD-10-CM

## 2020-02-19 DIAGNOSIS — D62 Acute posthemorrhagic anemia: Secondary | ICD-10-CM | POA: Diagnosis present

## 2020-02-19 DIAGNOSIS — Z23 Encounter for immunization: Secondary | ICD-10-CM | POA: Diagnosis not present

## 2020-02-19 DIAGNOSIS — R7989 Other specified abnormal findings of blood chemistry: Secondary | ICD-10-CM | POA: Diagnosis present

## 2020-02-19 DIAGNOSIS — Z79899 Other long term (current) drug therapy: Secondary | ICD-10-CM | POA: Diagnosis not present

## 2020-02-19 DIAGNOSIS — Z6834 Body mass index (BMI) 34.0-34.9, adult: Secondary | ICD-10-CM | POA: Diagnosis not present

## 2020-02-19 DIAGNOSIS — E559 Vitamin D deficiency, unspecified: Secondary | ICD-10-CM | POA: Diagnosis present

## 2020-02-19 DIAGNOSIS — K2980 Duodenitis without bleeding: Secondary | ICD-10-CM | POA: Diagnosis present

## 2020-02-19 DIAGNOSIS — E669 Obesity, unspecified: Secondary | ICD-10-CM | POA: Diagnosis present

## 2020-02-19 HISTORY — PX: ESOPHAGOGASTRODUODENOSCOPY (EGD) WITH PROPOFOL: SHX5813

## 2020-02-19 HISTORY — PX: BIOPSY: SHX5522

## 2020-02-19 LAB — BASIC METABOLIC PANEL
Anion gap: 4 — ABNORMAL LOW (ref 5–15)
BUN: 37 mg/dL — ABNORMAL HIGH (ref 6–20)
CO2: 23 mmol/L (ref 22–32)
Calcium: 7.7 mg/dL — ABNORMAL LOW (ref 8.9–10.3)
Chloride: 112 mmol/L — ABNORMAL HIGH (ref 98–111)
Creatinine, Ser: 1.36 mg/dL — ABNORMAL HIGH (ref 0.61–1.24)
GFR, Estimated: >60 mL/min (ref >60)
Glucose, Bld: 99 mg/dL (ref 70–99)
Potassium: 4 mmol/L (ref 3.5–5.1)
Sodium: 139 mmol/L (ref 135–145)

## 2020-02-19 LAB — TYPE AND SCREEN
ABO/RH(D): B POS
Antibody Screen: NEGATIVE

## 2020-02-19 LAB — CBC
HCT: 35.5 % — ABNORMAL LOW (ref 39.0–52.0)
Hemoglobin: 12 g/dL — ABNORMAL LOW (ref 13.0–17.0)
MCH: 29.9 pg (ref 26.0–34.0)
MCHC: 33.8 g/dL (ref 30.0–36.0)
MCV: 88.5 fL (ref 80.0–100.0)
Platelets: 171 10*3/uL (ref 150–400)
RBC: 4.01 MIL/uL — ABNORMAL LOW (ref 4.22–5.81)
RDW: 14 % (ref 11.5–15.5)
WBC: 11.1 10*3/uL — ABNORMAL HIGH (ref 4.0–10.5)
nRBC: 0 % (ref 0.0–0.2)

## 2020-02-19 LAB — HIV ANTIBODY (ROUTINE TESTING W REFLEX): HIV Screen 4th Generation wRfx: NONREACTIVE

## 2020-02-19 LAB — HEMOGLOBIN AND HEMATOCRIT, BLOOD
HCT: 31.8 % — ABNORMAL LOW (ref 39.0–52.0)
HCT: 32.2 % — ABNORMAL LOW (ref 39.0–52.0)
Hemoglobin: 10.6 g/dL — ABNORMAL LOW (ref 13.0–17.0)
Hemoglobin: 10.7 g/dL — ABNORMAL LOW (ref 13.0–17.0)

## 2020-02-19 LAB — NO BLOOD PRODUCTS

## 2020-02-19 SURGERY — ESOPHAGOGASTRODUODENOSCOPY (EGD) WITH PROPOFOL
Anesthesia: Monitor Anesthesia Care

## 2020-02-19 MED ORDER — PHENYLEPHRINE 40 MCG/ML (10ML) SYRINGE FOR IV PUSH (FOR BLOOD PRESSURE SUPPORT)
PREFILLED_SYRINGE | INTRAVENOUS | Status: DC | PRN
  Administered 2020-02-19 (×2): 120 ug via INTRAVENOUS

## 2020-02-19 MED ORDER — PROPOFOL 10 MG/ML IV BOLUS
INTRAVENOUS | Status: DC | PRN
  Administered 2020-02-19 (×2): 20 mg via INTRAVENOUS

## 2020-02-19 MED ORDER — INFLUENZA VAC SPLIT QUAD 0.5 ML IM SUSY
0.5000 mL | PREFILLED_SYRINGE | INTRAMUSCULAR | Status: AC | PRN
  Administered 2020-02-20: 0.5 mL via INTRAMUSCULAR
  Filled 2020-02-19: qty 0.5

## 2020-02-19 MED ORDER — LACTATED RINGERS IV SOLN: INTRAVENOUS | Status: DC | PRN

## 2020-02-19 MED ORDER — PROPOFOL 500 MG/50ML IV EMUL
INTRAVENOUS | Status: DC | PRN
  Administered 2020-02-19: 140 ug/kg/min via INTRAVENOUS

## 2020-02-19 SURGICAL SUPPLY — 14 items

## 2020-02-19 NOTE — Progress Notes (Signed)
Patient chart had FYI note that patient did not want to receive blood products.  CRNA noted blood band on patient in admitting, discussed with patient and informed charge RN patient stated he did not want to  receive blood products.  Notified Dr. Tarri Glenn.  Consent for refusal of blood products signed by patient.  Faxed to blood bank and placed on chart.  Blood band removed.  Report given to Ronalee Belts, RN in emergency room regarding blood band.

## 2020-02-19 NOTE — Anesthesia Preprocedure Evaluation (Signed)
Anesthesia Evaluation  Patient identified by MRN, date of birth, ID band Patient awake    Reviewed: Allergy & Precautions, NPO status , Patient's Chart, lab work & pertinent test results  Airway Mallampati: II  TM Distance: >3 FB Neck ROM: Full    Dental  (+) Teeth Intact   Pulmonary neg pulmonary ROS,    Pulmonary exam normal breath sounds clear to auscultation       Cardiovascular negative cardio ROS Normal cardiovascular exam Rhythm:Regular Rate:Normal     Neuro/Psych negative neurological ROS  negative psych ROS   GI/Hepatic negative GI ROS, Neg liver ROS,   Endo/Other  negative endocrine ROSObesity   Renal/GU negative Renal ROS     Musculoskeletal negative musculoskeletal ROS (+)   Abdominal   Peds  Hematology  (+) Blood dyscrasia, anemia ,   Anesthesia Other Findings Day of surgery medications reviewed with the patient.  Reproductive/Obstetrics                             Anesthesia Physical Anesthesia Plan  ASA: II  Anesthesia Plan: MAC   Post-op Pain Management:    Induction: Intravenous  PONV Risk Score and Plan: 1 and Propofol infusion  Airway Management Planned: Nasal Cannula  Additional Equipment:   Intra-op Plan:   Post-operative Plan:   Informed Consent: I have reviewed the patients History and Physical, chart, labs and discussed the procedure including the risks, benefits and alternatives for the proposed anesthesia with the patient or authorized representative who has indicated his/her understanding and acceptance.       Plan Discussed with: CRNA and Anesthesiologist  Anesthesia Plan Comments:         Anesthesia Quick Evaluation

## 2020-02-19 NOTE — ED Notes (Signed)
Pt is requesting an update regarding his EGD procedure and requesting to eat. MD Starla Link paged.

## 2020-02-19 NOTE — ED Notes (Signed)
At this time, pt is requesting to leave AMA. Pt states he's ready to leave since his EGD procedure is over. Floor coverage NP Sharlet Salina paged.

## 2020-02-19 NOTE — Transfer of Care (Signed)
Immediate Anesthesia Transfer of Care Note  Patient: William Leon  Procedure(s) Performed: ESOPHAGOGASTRODUODENOSCOPY (EGD) WITH PROPOFOL (N/A ) BIOPSY  Patient Location: PACU  Anesthesia Type:MAC  Level of Consciousness: awake, alert  and oriented  Airway & Oxygen Therapy: Patient Spontanous Breathing and Patient connected to face mask oxygen  Post-op Assessment: Report given to RN, Post -op Vital signs reviewed and stable and Patient moving all extremities X 4  Post vital signs: Reviewed and stable  Last Vitals:  Vitals Value Taken Time  BP    Temp    Pulse    Resp    SpO2      Last Pain:  Vitals:   02/19/20 1242  TempSrc: Oral  PainSc: 0-No pain         Complications: No complications documented.

## 2020-02-19 NOTE — Consult Note (Signed)
Consultation  Referring Provider: Dr. Starla Link     Primary Care Physician:  Midge Minium, MD Primary Gastroenterologist:  Havery Moros Reason for Consultation: Melena             HPI:   William Leon is a 49 y.o. male with a past medical history significant for hyperlipidemia and seasonal allergies, who presented to the ER on 02/18/2020 with a complaint of episodes of dark tarry stools associated with lightheadedness.    Today, the patient describes that he ate some "ballpark food" on 02/17/2020 and started with diarrhea, he then started with dark tarry stools around 4:00 in the morning on 02/18/2020 tells me that he then took a laxative, magnesium citrate because he wanted to "push whatever it was out of my stomach".  Tells me now that he is not certain why he took a laxative when he was already having diarrhea.  Describes at least 6 further stools and the last one around 9:00 this morning which he tells me was mostly just gas, still all of these have been black.  Also describes a history of using BC powders at least 2 a couple days ago for some hand pain.  Associated symptoms include lightheadedness.  Denies any other NSAIDs.    Denies fever, chills, nausea, vomiting, weight loss or abdominal pain.  ED course: Pulse 115, blood pressure 140/79; fecal occult blood positive, CBC with a white count of 12, CBC with a normal hemoglobin at 13.8, CMP with an elevated BUN at 47, creatinine 1.45 (1.48 on 04/11/2019)  GI history: 05/04/2019 colonoscopy Dr. Havery Moros: The examined portion of the ileum was normal. - One 8 to 10 mm polyp in the cecum, removed with a cold snare. Resected and retrieved. - Four 3 to 7 mm polyps in the ascending colon, removed with a cold snare. Resected andretrieved. - One 3 mm polyp in the transverse colon, removed with a cold snare. Resected and retrieved. - Diverticulosis in the sigmoid colon. - One 3 mm polyp in the rectum, removed with a cold snare. Resected and  retrieved. - Internal hemorrhoids. - The examination was otherwise normal.  Pathology showed adenomatous polyps and repeat recommended in 3 years.  Past Medical History:  Diagnosis Date  . Allergy    seasonal-ragweed  . Hyperlipidemia     Past Surgical History:  Procedure Laterality Date  . ACHILLES TENDON SURGERY Right 01/2017  . NO PAST SURGERIES      Family History  Problem Relation Age of Onset  . Alcohol abuse Brother   . Bowel Disease Brother   . Colon cancer Neg Hx   . Colon polyps Neg Hx   . Esophageal cancer Neg Hx   . Rectal cancer Neg Hx   . Stomach cancer Neg Hx     Social History   Tobacco Use  . Smoking status: Never Smoker  . Smokeless tobacco: Never Used  Vaping Use  . Vaping Use: Never used  Substance Use Topics  . Alcohol use: No    Alcohol/week: 0.0 standard drinks  . Drug use: No    Prior to Admission medications   Medication Sig Start Date End Date Taking? Authorizing Provider  rosuvastatin (CRESTOR) 10 MG tablet Take 1 tablet (10 mg total) by mouth daily. Patient not taking: Reported on 02/18/2020 05/23/19   Midge Minium, MD    Current Facility-Administered Medications  Medication Dose Route Frequency Provider Last Rate Last Admin  . 0.9 %  sodium chloride infusion  Intravenous Continuous Reubin Milan, MD 100 mL/hr at 02/19/20 0022 New Bag at 02/19/20 0022  . acetaminophen (TYLENOL) tablet 650 mg  650 mg Oral Q6H PRN Reubin Milan, MD       Or  . acetaminophen (TYLENOL) suppository 650 mg  650 mg Rectal Q6H PRN Reubin Milan, MD      . influenza vac split quadrivalent PF (FLUARIX) injection 0.5 mL  0.5 mL Intramuscular Prior to discharge Aline August, MD      . ondansetron [facility]) tablet 4 mg  4 mg Oral Q6H PRN Reubin Milan, MD       Or  . ondansetron [facility]) injection 4 mg  4 mg Intravenous Q6H PRN Reubin Milan, MD      . pantoprazole (PROTONIX) injection 40 mg  40 mg Intravenous Q12H Reubin Milan, MD   40 mg at 02/19/20 1027   Current Outpatient Medications  Medication Sig Dispense Refill  . rosuvastatin (CRESTOR) 10 MG tablet Take 1 tablet (10 mg total) by mouth daily. (Patient not taking: Reported on 02/18/2020) 90 tablet 1    Allergies as of 02/18/2020  . (No Known Allergies)     Review of Systems:    Constitutional: No weight loss, fever or chills Skin: No rash  Cardiovascular: No chest pain  Respiratory: No SOB Gastrointestinal: See HPI and otherwise negative Genitourinary: No dysuria  Neurological: No headache, dizziness or syncope Musculoskeletal: No new muscle or joint pain Hematologic: No bleeding  Psychiatric: No history of depression or anxiety    Physical Exam:  Vital signs in last 24 hours: Temp:  [98.6 F (37 C)-99 F (37.2 C)] 98.6 F (37 C) (11/09 0413) Pulse Rate:  [85-120] 88 (11/09 1023) Resp:  [12-20] 16 (11/09 1023) BP: (100-151)/(58-96) 128/68 (11/09 1023) SpO2:  [96 %-100 %] 100 % (11/09 1023) Weight:  [108.9 kg] 108.9 kg (11/08 1858)   General:   Pleasant AA male appears to be in NAD, Well developed, Well nourished, alert and cooperative Head:  Normocephalic and atraumatic. Eyes:   PEERL, EOMI. No icterus. Conjunctiva pink. Ears:  Normal auditory acuity. Neck:  Supple Throat: Oral cavity and pharynx without inflammation, swelling or lesion.  Lungs: Respirations even and unlabored. Lungs clear to auscultation bilaterally.   No wheezes, crackles, or rhonchi.  Heart: Normal S1, S2. No MRG. Regular rate and rhythm. No peripheral edema, cyanosis or pallor.  Abdomen:  Soft, nondistended, nontender. No rebound or guarding. Normal bowel sounds. No appreciable masses or hepatomegaly. Rectal:  Not performed.  Msk:  Symmetrical without gross deformities. Peripheral pulses intact.  Extremities:  Without edema, no deformity or joint abnormality.  Neurologic:  Alert and  oriented x4;  grossly normal neurologically.  Skin:   Dry and intact  without significant lesions or rashes. Psychiatric: Demonstrates good judgement and reason without abnormal affect or behaviors.   LAB RESULTS: Recent Labs    02/18/20 1918 02/18/20 2144 02/19/20 0410  WBC 12.0* 12.7* 11.1*  HGB 13.8 13.5 12.0*  HCT 42.1 41.3 35.5*  PLT 201 181 171   BMET Recent Labs    02/18/20 1918  NA 136  K 4.7  CL 105  CO2 25  GLUCOSE 135*  BUN 47*  CREATININE 1.45*  CALCIUM 8.5*   LFT Recent Labs    02/18/20 1918  PROT 6.5  ALBUMIN 3.9  AST 26  ALT 33  ALKPHOS 40  BILITOT 0.8   PT/INR Recent Labs    02/18/20 2144  LABPROT 13.6  INR 1.1     Impression / Plan:   Impression: 1.  Melena: Multiple black stools over the past 36 hours, last around 9:00 this morning, does have history of BC powder use over the past few days, hemoglobin trended down 12--> 11.1 overnight ; consider upper GI bleed most likely 2.  Leukocytosis: trending down overnight  Plan: 1.  Scheduled patient for an EGD with Dr. Tarri Glenn this afternoon.  Did discuss risks, benefits, limitations and alternatives and patient agrees to proceed. 2.  Patient has been Covid tested and is negative 3.  Patient will remain n.p.o. until after time of procedure 4.  Agree with IV PPI twice daily 5.  Please await any further recommendations from Dr. Tarri Glenn after time of procedure.  Thank you for your kind consultation, we will continue to follow.  [facility]  02/19/2020, 10:48 AM

## 2020-02-19 NOTE — Progress Notes (Signed)
Patient ID: William Leon, male   DOB: Nov 21, 1970, 49 y.o.   MRN: [id number]  PROGRESS NOTE    William Leon  YIR:[id number] DOB: 03-31-1971 DOA: 02/18/2020 PCP: Midge Minium, MD   Brief Narrative:  49 year old male with history of seasonal allergies, hyperlipidemia presented with dark tarry stools and lightheadedness. On presentation, fecal occult blood was positive, hemoglobin was 13.8. He was started on IV Protonix. GI was consulted.  Assessment & Plan:   Possible upper GI bleeding presenting with melena -Presented with melena and hemoglobin of 13.8 and started on Protonix 40 mg IV every 12 hours. Had a black bowel movement early this morning. Await GI evaluation. Continue n.p.o. for now.  Hyperlipidemia -Currently not taking rosuvastatin  Leukocytosis -Probably reactive. Improving.  Obesity -Outpatient follow-up  DVT prophylaxis: SCDs Code Status: Full Family Communication: None at bedside Disposition Plan: Status is: Observation  The patient will require care spanning > 2 midnights and should be moved to inpatient because: Inpatient level of care appropriate due to severity of illness  Dispo: The patient is from: Home              Anticipated d/c is to: Home              Anticipated d/c date is: 1 day              Patient currently is not medically stable to d/c.   Consultants: Gastroenterology  Procedures: None  Antimicrobials: None   Subjective: Patient seen and examined at bedside. Denies worsening abdominal pain, nausea or vomiting. Had a black bowel movement early this morning. No worsening shortness of breath.  Objective: Vitals:   02/19/20 0800 02/19/20 0900 02/19/20 1023 02/19/20 1204  BP: (!) 100/58 (!) 100/58 128/68 (!) 116/59  Pulse: 87 87 88 80  Resp: 16 16 16 16   Temp:      TempSrc:      SpO2: 100% 100% 100% 98%  Weight:      Height:        Intake/Output Summary (Last 24 hours) at 02/19/2020 1212 Last data filed at 02/19/2020 0000 Gross  per 24 hour  Intake 1100 ml  Output --  Net 1100 ml   Filed Weights   02/18/20 1858  Weight: 108.9 kg    Examination:  General exam: Appears calm and comfortable  Respiratory system: Bilateral decreased breath sounds at bases Cardiovascular system: S1 & S2 heard, Rate controlled Gastrointestinal system: Abdomen is nondistended, soft and nontender. Normal bowel sounds heard. Extremities: No cyanosis, clubbing, edema  Central nervous system: Alert and oriented. No focal neurological deficits. Moving extremities Skin: No rashes, lesions or ulcers Psychiatry: Judgement and insight appear normal. Mood & affect appropriate.     Data Reviewed: I have personally reviewed following labs and imaging studies  CBC: Recent Labs  Lab 02/18/20 1918 02/18/20 2144 02/19/20 0410  WBC 12.0* 12.7* 11.1*  NEUTROABS 8.6*  --   --   HGB 13.8 13.5 12.0*  HCT 42.1 41.3 35.5*  MCV 90.3 89.6 88.5  PLT 201 181 500   Basic Metabolic Panel: Recent Labs  Lab 02/18/20 1918  NA 136  K 4.7  CL 105  CO2 25  GLUCOSE 135*  BUN 47*  CREATININE 1.45*  CALCIUM 8.5*   GFR: Estimated Creatinine Clearance: 76.5 mL/min (A) (by C-G formula based on SCr of 1.45 mg/dL (H)). Liver Function Tests: Recent Labs  Lab 02/18/20 1918  AST 26  ALT 33  ALKPHOS 40  BILITOT 0.8  PROT 6.5  ALBUMIN 3.9   No results for input(s): LIPASE, AMYLASE in the last 168 hours. No results for input(s): AMMONIA in the last 168 hours. Coagulation Profile: Recent Labs  Lab 02/18/20 2144  INR 1.1   Cardiac Enzymes: No results for input(s): CKTOTAL, CKMB, CKMBINDEX, TROPONINI in the last 168 hours. BNP (last 3 results) No results for input(s): PROBNP in the last 8760 hours. HbA1C: No results for input(s): HGBA1C in the last 72 hours. CBG: No results for input(s): GLUCAP in the last 168 hours. Lipid Profile: No results for input(s): CHOL, HDL, LDLCALC, TRIG, CHOLHDL, LDLDIRECT in the last 72 hours. Thyroid  Function Tests: No results for input(s): TSH, T4TOTAL, FREET4, T3FREE, THYROIDAB in the last 72 hours. Anemia Panel: No results for input(s): VITAMINB12, FOLATE, FERRITIN, TIBC, IRON, RETICCTPCT in the last 72 hours. Sepsis Labs: No results for input(s): PROCALCITON, LATICACIDVEN in the last 168 hours.  Recent Results (from the past 240 hour(s))  Respiratory Panel by RT PCR (Flu A&B, Covid) - Nasopharyngeal Swab     Status: None   Collection Time: 02/18/20 10:02 PM   Specimen: Nasopharyngeal Swab  Result Value Ref Range Status   SARS Coronavirus 2 by RT PCR NEGATIVE NEGATIVE Final    Comment: (NOTE) SARS-CoV-2 target nucleic acids are NOT DETECTED.  The SARS-CoV-2 RNA is generally detectable in upper respiratoy specimens during the acute phase of infection. The lowest concentration of SARS-CoV-2 viral copies this assay can detect is 131 copies/mL. A negative result does not preclude SARS-Cov-2 infection and should not be used as the sole basis for treatment or other patient management decisions. A negative result may occur with  improper specimen collection/handling, submission of specimen other than nasopharyngeal swab, presence of viral mutation(s) within the areas targeted by this assay, and inadequate number of viral copies (<131 copies/mL). A negative result must be combined with clinical observations, patient history, and epidemiological information. The expected result is Negative.  Fact Sheet for Patients:  PinkCheek.be  Fact Sheet for Healthcare Providers:  GravelBags.it  This test is no t yet approved or cleared by the [place] FDA and  has been authorized for detection and/or diagnosis of SARS-CoV-2 by FDA under an Emergency Use Authorization (EUA). This EUA will remain  in effect (meaning this test can be used) for the duration of the COVID-19 declaration under Section 564(b)(1) of the Act, 21  U.S.C. section 360bbb-3(b)(1), unless the authorization is terminated or revoked sooner.     Influenza A by PCR NEGATIVE NEGATIVE Final   Influenza B by PCR NEGATIVE NEGATIVE Final    Comment: (NOTE) The Xpert Xpress SARS-CoV-2/FLU/RSV assay is intended as an aid in  the diagnosis of influenza from Nasopharyngeal swab specimens and  should not be used as a sole basis for treatment. Nasal washings and  aspirates are unacceptable for Xpert Xpress SARS-CoV-2/FLU/RSV  testing.  Fact Sheet for Patients: PinkCheek.be  Fact Sheet for Healthcare Providers: GravelBags.it  This test is not yet approved or cleared by the [place] FDA and  has been authorized for detection and/or diagnosis of SARS-CoV-2 by  FDA under an Emergency Use Authorization (EUA). This EUA will remain  in effect (meaning this test can be used) for the duration of the  Covid-19 declaration under Section 564(b)(1) of the Act, 21  U.S.C. section 360bbb-3(b)(1), unless the authorization is  terminated or revoked. Performed at [facility], [place] [address] [place], [place] [address]  Radiology Studies: No results found.      Scheduled Meds:  pantoprazole (PROTONIX) IV  40 mg Intravenous Q12H   Continuous Infusions:  sodium chloride 100 mL/hr at 02/19/20 0022          Aline August, MD [facility] 02/19/2020, 12:12 PM

## 2020-02-19 NOTE — Op Note (Signed)
[facility] Patient Name: William Leon Procedure Date: 02/19/2020 MRN: [id number] Attending MD: Thornton Park MD, MD Date of Birth: [birth year] CSN: [id number] Age: 49 Admit Type: Inpatient Procedure:                Upper GI endoscopy Indications:              Melena, Anemia, recent BC powder use for pain Providers:                Thornton Park MD, MD, Cleda Daub, RN, Ladona Ridgel, Technician Referring MD:              Medicines:                Monitored Anesthesia Care Complications:            No immediate complications. Estimated blood loss:                            Minimal. Estimated Blood Loss:     Estimated blood loss was minimal. Procedure:                Pre-Anesthesia Assessment:                           - Prior to the procedure, a History and Physical                            was performed, and patient medications and                            allergies were reviewed. The patient's tolerance of                            previous anesthesia was also reviewed. The risks                            and benefits of the procedure and the sedation                            options and risks were discussed with the patient.                            All questions were answered, and informed consent                            was obtained. Prior Anticoagulants: The patient has                            taken no previous anticoagulant or antiplatelet                            agents. ASA Grade Assessment: II - A patient with  mild systemic disease. After reviewing the risks                            and benefits, the patient was deemed in                            satisfactory condition to undergo the procedure.                           After obtaining informed consent, the endoscope was                            passed under direct vision. Throughout the                            procedure,  the patient's blood pressure, pulse, and                            oxygen saturations were monitored continuously. The                            GIF-H190 ([id number]) Olympus gastroscope was                            introduced through the mouth, and advanced to the                            third part of duodenum. The upper GI endoscopy was                            accomplished without difficulty. The patient                            tolerated the procedure well. Scope In: Scope Out: Findings:      The examined esophagus was normal.      A medium-sized hiatal hernia was present. No Cameron's erosions.      Diffuse mild inflammation characterized by erythema, friability and       granularity was found in the gastric body. Biopsies were taken from the       antrum, body, and fundus with a cold forceps for histology. Estimated       blood loss was minimal.      Two non-bleeding cratered gastric ulcers with no stigmata of bleeding       were found in the gastric antrum. The larger ulcer had some overlying       heme after being touched by the scope, but there was no active bleeding.       The largest lesion was at least 10 mm in largest dimension.      Mild erythema present in the duodenal bulb.      The exam was otherwise without abnormality. No active bleeding seen. No       blood present in the examined GI tact. Impression:               - Normal esophagus.                           -  Medium-sized hiatal hernia.                           - Gastritis. Biopsied.                           - Two non-bleeding gastric ulcers with no stigmata                            of bleeding. These are likely due to NSAIDs.                           - Duodenitis. Moderate Sedation:      Not Applicable - Patient had care per Anesthesia. Recommendation:           - Clear liquid diet. Advance as tolerated if                            hemoglobin remains stable.                           - Continue  present medications.                           - No aspirin, ibuprofen, naproxen, or other                            non-steroidal anti-inflammatory drugs.                           - Pantoprazole 40 mg IV BID x 24 hours, then                            convert to 40 mg BID po x 12 weeks.                           - Await pathology results.                           - Repeat upper endoscopy with Dr. Havery Moros in 12                            weeks to check healing. Procedure Code(s):        --- Professional ---                           [phone], Esophagogastroduodenoscopy, flexible,                            transoral; with biopsy, single or multiple Diagnosis Code(s):        --- Professional ---                           K44.9, Diaphragmatic hernia without obstruction or  gangrene                           K29.70, Gastritis, unspecified, without bleeding                           K25.9, Gastric ulcer, unspecified as acute or                            chronic, without hemorrhage or perforation                           K92.1, Melena (includes Hematochezia) CPT copyright 2019 American Medical Association. All rights reserved. The codes documented in this report are preliminary and upon coder review may  be revised to meet current compliance requirements. Thornton Park MD, MD 02/19/2020 1:53:49 PM This report has been signed electronically. Number of Addenda: 0

## 2020-02-19 NOTE — Telephone Encounter (Signed)
Noted  

## 2020-02-19 NOTE — Anesthesia Procedure Notes (Signed)
Procedure Name: MAC Date/Time: 02/19/2020 1:26 PM Performed by: Niel Hummer, CRNA Pre-anesthesia Checklist: Patient identified, Emergency Drugs available, Suction available and Patient being monitored Oxygen Delivery Method: Simple face mask

## 2020-02-20 ENCOUNTER — Other Ambulatory Visit: Payer: Self-pay

## 2020-02-20 DIAGNOSIS — K921 Melena: Secondary | ICD-10-CM | POA: Diagnosis not present

## 2020-02-20 LAB — BASIC METABOLIC PANEL
Anion gap: 5 (ref 5–15)
BUN: 37 mg/dL — ABNORMAL HIGH (ref 6–20)
CO2: 25 mmol/L (ref 22–32)
Calcium: 8.3 mg/dL — ABNORMAL LOW (ref 8.9–10.3)
Chloride: 106 mmol/L (ref 98–111)
Creatinine, Ser: 1.51 mg/dL — ABNORMAL HIGH (ref 0.61–1.24)
GFR, Estimated: 56 mL/min — ABNORMAL LOW (ref >60)
Glucose, Bld: 98 mg/dL (ref 70–99)
Potassium: 3.7 mmol/L (ref 3.5–5.1)
Sodium: 136 mmol/L (ref 135–145)

## 2020-02-20 LAB — CBC WITH DIFFERENTIAL/PLATELET
Abs Immature Granulocytes: 0.01 10*3/uL (ref 0.00–0.07)
Basophils Absolute: 0 10*3/uL (ref 0.0–0.1)
Basophils Relative: 1 %
Eosinophils Absolute: 0.1 10*3/uL (ref 0.0–0.5)
Eosinophils Relative: 2 %
HCT: 31.2 % — ABNORMAL LOW (ref 39.0–52.0)
Hemoglobin: 10.3 g/dL — ABNORMAL LOW (ref 13.0–17.0)
Immature Granulocytes: 0 %
Lymphocytes Relative: 29 %
Lymphs Abs: 2.1 10*3/uL (ref 0.7–4.0)
MCH: 30.1 pg (ref 26.0–34.0)
MCHC: 33 g/dL (ref 30.0–36.0)
MCV: 91.2 fL (ref 80.0–100.0)
Monocytes Absolute: 0.5 10*3/uL (ref 0.1–1.0)
Monocytes Relative: 7 %
Neutro Abs: 4.6 10*3/uL (ref 1.7–7.7)
Neutrophils Relative %: 61 %
Platelets: 143 10*3/uL — ABNORMAL LOW (ref 150–400)
RBC: 3.42 MIL/uL — ABNORMAL LOW (ref 4.22–5.81)
RDW: 13.8 % (ref 11.5–15.5)
WBC: 7.5 10*3/uL (ref 4.0–10.5)
nRBC: 0 % (ref 0.0–0.2)

## 2020-02-20 LAB — MAGNESIUM: Magnesium: 2.3 mg/dL (ref 1.7–2.4)

## 2020-02-20 LAB — SURGICAL PATHOLOGY

## 2020-02-20 MED ORDER — PANTOPRAZOLE SODIUM 40 MG PO TBEC: 40.0000 mg | DELAYED_RELEASE_TABLET | Freq: Two times a day (BID) | ORAL | 2 refills | Status: DC

## 2020-02-20 MED ORDER — PANTOPRAZOLE SODIUM 40 MG PO TBEC: 40.0000 mg | DELAYED_RELEASE_TABLET | Freq: Two times a day (BID) | ORAL | Status: DC

## 2020-02-20 MED FILL — ROSUVASTATIN CALCIUM 10 MG: 10 | 90 days supply | Qty: 90 | Fill #1

## 2020-02-20 MED FILL — PANTOPRAZOLE SOD DR 40 MG T: 40 | 30 days supply | Qty: 60 | Fill #0

## 2020-02-20 NOTE — Progress Notes (Signed)
Pt will be discharged today.  Ivs and Telemetry discontinued.  Received Influenza vaccine without difficulty.  Will review discharge instructions and escort patient out in wheelchair.

## 2020-02-20 NOTE — Discharge Instructions (Signed)
Peptic Ulcer  A peptic ulcer is a painful sore in the lining of your stomach or the first part of your small intestine. What are the causes? Common causes of this condition include:  An infection.  Using certain pain medicines too often or too much. What increases the risk? You are more likely to get this condition if you:  Smoke.  Have a family history of ulcer disease.  Drink alcohol.  Have been hospitalized in an intensive care unit (ICU). What are the signs or symptoms? Symptoms include:  Burning pain in the area between the chest and the belly button. The pain may: ? Not go away (be persistent). ? Be worse when your stomach is empty. ? Be worse at night.  Heartburn.  Feeling sick to your stomach (nauseous) and throwing up (vomiting).  Bloating. If the ulcer results in bleeding, it can cause you to:  Have poop (stool) that is black and looks like tar.  Throw up bright red blood.  Throw up material that looks like coffee grounds. How is this treated? Treatment for this condition may include:  Stopping things that can cause the ulcer, such as: ? Smoking. ? Using pain medicines.  Medicines to reduce stomach acid.  Antibiotic medicines if the ulcer is caused by an infection.  A procedure that is done using a small, flexible tube that has a camera at the end (upper endoscopy). This may be done if you have a bleeding ulcer.  Surgery. This may be needed if: ? You have a lot of bleeding. ? The ulcer caused a hole somewhere in the digestive system. Follow these instructions at home:  Do not drink alcohol if your doctor tells you not to drink.  Limit how much caffeine you take in.  Do not use any products that contain nicotine or tobacco, such as cigarettes, e-cigarettes, and chewing tobacco. If you need help quitting, ask your doctor.  Take over-the-counter and prescription medicines only as told by your doctor. ? Do not stop or change your medicines unless  you talk with your doctor about it first. ? Do not take aspirin, ibuprofen, or other NSAIDs unless your doctor told you to do so.  Keep all follow-up visits as told by your doctor. This is important. Contact a doctor if:  You do not get better in 7 days after you start treatment.  You keep having an upset stomach (indigestion) or heartburn. Get help right away if:  You have sudden, sharp pain in your belly (abdomen).  You have belly pain that does not go away.  You have bloody poop (stool) or black, tarry poop.  You throw up blood. It may look like coffee grounds.  You feel light-headed or feel like you may pass out (faint).  You get weak.  You get sweaty or feel sticky and cold to the touch (clammy). Summary  Symptoms of a peptic ulcer include burning pain in the area between the chest and the belly button.  Take medicines only as told by your doctor.  Limit how much alcohol and caffeine you have.  Keep all follow-up visits as told by your doctor. This information is not intended to replace advice given to you by your health care provider. Make sure you discuss any questions you have with your health care provider. Document Revised: 10/04/2017 Document Reviewed: 10/04/2017 Elsevier Patient Education  [place].

## 2020-02-20 NOTE — Discharge Summary (Signed)
[facility]  Physician Discharge Summary   Patient ID: Marguerite Jarboe MRN: [id number] DOB/AGE: [birth year] 51 y.o.  Admit date: 02/18/2020 Discharge date: 02/20/2020  PCP: Midge Minium, MD  DISCHARGE DIAGNOSES:  Melena Acute Blood Loss Anemia Gastric Ulcers sec to NSAIDS Duodenitis Gastritis   RECOMMENDATIONS FOR OUTPATIENT FOLLOW UP: 1. Patient told to stop using NSAIDS 2. Needs repeat EGD in 12 weeks    Home Health:None  Equipment/Devices:None   CODE STATUS:Full Code   DISCHARGE CONDITION: fair  Diet recommendation: Regular (avoid fried, oily, spicy foods)  INITIAL HISTORY: 49 year old male with history of seasonal allergies, hyperlipidemia presented with dark tarry stools and lightheadedness. On presentation, fecal occult blood was positive, hemoglobin was 13.8. He was started on IV Protonix. GI was consulted.  Consultations:  [facility]  Procedures: EGD 11/9 Impression:               - Normal esophagus.                           - Medium-sized hiatal hernia.                           - Gastritis. Biopsied.                           - Two non-bleeding gastric ulcers with no stigmata                            of bleeding. These are likely due to NSAIDs.                           - Duodenitis.   HOSPITAL COURSE:   Upper GI bleeding presenting with melena/Acute blood loss anemia Presented with melena and hemoglobin of 13.8 and started on Protonix 40 mg IV every 12 hours. Hemoglobin dropped to 10. Remained stable. Seen by GI and underwent EGD which showed NSAID induced gastric ulcers along with duodenitis and gastritis. Patient to be on BID PPI for 12 weeks with repeat EGD in 12 weeks. To stop NSAIDS.  Hyperlipidemia F/u with PCP.  Leukocytosis Probably reactive.  Obesity Estimated body mass index is 34.07 kg/m as calculated from the following:   Height as of this encounter: 5\' 11"  (1.803 m).   Weight as of this encounter: 110.8  kg.  Patient stable this morning. Wants to go home. [place] for discharge.   PERTINENT LABS:  The results of significant diagnostics from this hospitalization (including imaging, microbiology, ancillary and laboratory) are listed below for reference.    Microbiology: Recent Results (from the past 240 hour(s))  Respiratory Panel by RT PCR (Flu A&B, Covid) - Nasopharyngeal Swab     Status: None   Collection Time: 02/18/20 10:02 PM   Specimen: Nasopharyngeal Swab  Result Value Ref Range Status   SARS Coronavirus 2 by RT PCR NEGATIVE NEGATIVE Final    Comment: (NOTE) SARS-CoV-2 target nucleic acids are NOT DETECTED.  The SARS-CoV-2 RNA is generally detectable in upper respiratoy specimens during the acute phase of infection. The lowest concentration of SARS-CoV-2 viral copies this assay can detect is 131 copies/mL. A negative result does not preclude SARS-Cov-2 infection and should not be used as the sole basis for treatment or other patient management decisions. A negative result may occur with  improper  specimen collection/handling, submission of specimen other than nasopharyngeal swab, presence of viral mutation(s) within the areas targeted by this assay, and inadequate number of viral copies (<131 copies/mL). A negative result must be combined with clinical observations, patient history, and epidemiological information. The expected result is Negative.  Fact Sheet for Patients:  PinkCheek.be  Fact Sheet for Healthcare Providers:  GravelBags.it  This test is no t yet approved or cleared by the [place] FDA and  has been authorized for detection and/or diagnosis of SARS-CoV-2 by FDA under an Emergency Use Authorization (EUA). This EUA will remain  in effect (meaning this test can be used) for the duration of the COVID-19 declaration under Section 564(b)(1) of the Act, 21 U.S.C. section 360bbb-3(b)(1), unless the  authorization is terminated or revoked sooner.     Influenza A by PCR NEGATIVE NEGATIVE Final   Influenza B by PCR NEGATIVE NEGATIVE Final    Comment: (NOTE) The Xpert Xpress SARS-CoV-2/FLU/RSV assay is intended as an aid in  the diagnosis of influenza from Nasopharyngeal swab specimens and  should not be used as a sole basis for treatment. Nasal washings and  aspirates are unacceptable for Xpert Xpress SARS-CoV-2/FLU/RSV  testing.  Fact Sheet for Patients: PinkCheek.be  Fact Sheet for Healthcare Providers: GravelBags.it  This test is not yet approved or cleared by the [place] FDA and  has been authorized for detection and/or diagnosis of SARS-CoV-2 by  FDA under an Emergency Use Authorization (EUA). This EUA will remain  in effect (meaning this test can be used) for the duration of the  Covid-19 declaration under Section 564(b)(1) of the Act, 21  U.S.C. section 360bbb-3(b)(1), unless the authorization is  terminated or revoked. Performed at [facility], [place] [address], [place], [place] [address]      Labs:   Basic Metabolic Panel: Recent Labs  Lab 02/18/20 1918 02/19/20 1203 02/20/20 0426  NA 136 139 136  K 4.7 4.0 3.7  CL 105 112* 106  CO2 25 23 25   GLUCOSE 135* 99 98  BUN 47* 37* 37*  CREATININE 1.45* 1.36* 1.51*  CALCIUM 8.5* 7.7* 8.3*  MG  --   --  2.3   Liver Function Tests: Recent Labs  Lab 02/18/20 1918  AST 26  ALT 33  ALKPHOS 40  BILITOT 0.8  PROT 6.5  ALBUMIN 3.9   CBC: Recent Labs  Lab 02/18/20 1918 02/18/20 1918 02/18/20 2144 02/19/20 0410 02/19/20 1203 02/19/20 1800 02/20/20 0426  WBC 12.0*  --  12.7* 11.1*  --   --  7.5  NEUTROABS 8.6*  --   --   --   --   --  4.6  HGB 13.8   < > 13.5 12.0* 10.6* 10.7* 10.3*  HCT 42.1   < > 41.3 35.5* 31.8* 32.2* 31.2*  MCV 90.3  --  89.6 88.5  --   --  91.2  PLT 201  --  181 171  --   --  143*   < > = values  in this interval not displayed.     IMAGING STUDIES No results found.  DISCHARGE EXAMINATION: Vitals:   02/20/20 0300 02/20/20 0428 02/20/20 0819 02/20/20 1241  BP: 116/69 112/70 109/62 112/70  Pulse: 79 70 70 75  Resp: 18 18 18 18   Temp:  98.7 F (37.1 C) 98.2 F (36.8 C) 98.1 F (36.7 C)  TempSrc:   Oral   SpO2: 100% 90% 98% 95%  Weight:  110.8 kg  Height:  5\' 11"  (1.803 m)     General appearance: alert, cooperative and no distress Resp: clear to auscultation bilaterally Cardio: regular rate and rhythm, S1, S2 normal, no murmur, click, rub or gallop GI: soft, non-tender; bowel sounds normal; no masses,  no organomegaly  DISPOSITION: Home  Discharge Instructions    Call MD for:  difficulty breathing, headache or visual disturbances   Complete by: As directed    Call MD for:  extreme fatigue   Complete by: As directed    Call MD for:  persistant dizziness or light-headedness   Complete by: As directed    Call MD for:  persistant nausea and vomiting   Complete by: As directed    Call MD for:  severe uncontrolled pain   Complete by: As directed    Call MD for:  temperature >100.4   Complete by: As directed    Diet - low sodium heart healthy   Complete by: As directed    Discharge instructions   Complete by: As directed    Please avoid NSAIDs, medication such as Motrin or naproxen etc.  Take your medications as prescribed.  Be sure to follow-up with your primary care provider in 1 week.  You will need to have a repeat endoscopy in 12 weeks.  You were cared for by a hospitalist during your hospital stay. If you have any questions about your discharge medications or the care you received while you were in the hospital after you are discharged, you can call the unit and asked to speak with the hospitalist on call if the hospitalist that took care of you is not available. Once you are discharged, your primary care physician will handle any further medical issues. Please  note that NO REFILLS for any discharge medications will be authorized once you are discharged, as it is imperative that you return to your primary care physician (or establish a relationship with a primary care physician if you do not have one) for your aftercare needs so that they can reassess your need for medications and monitor your lab values. If you do not have a primary care physician, you can call [phone] for a physician referral.   Increase activity slowly   Complete by: As directed         Allergies as of 02/20/2020   No Known Allergies     Medication List    TAKE these medications   pantoprazole 40 MG tablet Commonly known as: PROTONIX Take 1 tablet (40 mg total) by mouth 2 (two) times daily.   rosuvastatin 10 MG tablet Commonly known as: Crestor Take 1 tablet (10 mg total) by mouth daily.         Follow-up Information    Armbruster, Carlota Raspberry, MD. Schedule an appointment as soon as possible for a visit in 2 month(s).   Specialty: Gastroenterology Contact information: [place] [address] [phone]        Midge Minium, MD. Schedule an appointment as soon as possible for a visit in 1 week(s).   Specialty: Family Medicine Contact information: [address] [place] [address] [place] [address] [phone]               TOTAL DISCHARGE TIME: 44 mins  [place] [employer] on [email or web address]  02/20/2020, 4:24 PM

## 2020-02-20 NOTE — Anesthesia Postprocedure Evaluation (Signed)
Anesthesia Post Note  Patient: William Leon  Procedure(s) Performed: ESOPHAGOGASTRODUODENOSCOPY (EGD) WITH PROPOFOL (N/A ) BIOPSY     Patient location during evaluation: Endoscopy Anesthesia Type: MAC Level of consciousness: awake and alert Pain management: pain level controlled Vital Signs Assessment: post-procedure vital signs reviewed and stable Respiratory status: spontaneous breathing, nonlabored ventilation and respiratory function stable Cardiovascular status: stable and blood pressure returned to baseline Postop Assessment: no apparent nausea or vomiting Anesthetic complications: no   No complications documented.  Last Vitals:  Vitals:   02/20/20 0428 02/20/20 0819  BP: 112/70 109/62  Pulse: 70 70  Resp: 18 18  Temp: 37.1 C 36.8 C  SpO2: 90% 98%    Last Pain:  Vitals:   02/20/20 0819  TempSrc: Oral  PainSc:                  Catalina Gravel

## 2020-02-21 ENCOUNTER — Telehealth: Payer: Self-pay

## 2020-02-21 ENCOUNTER — Other Ambulatory Visit: Payer: Self-pay

## 2020-02-21 DIAGNOSIS — A048 Other specified bacterial intestinal infections: Secondary | ICD-10-CM

## 2020-02-21 MED ORDER — TETRACYCLINE HCL 500 MG PO CAPS: 500.0000 mg | ORAL_CAPSULE | Freq: Four times a day (QID) | ORAL | 0 refills | Status: DC

## 2020-02-21 MED ORDER — BISMUTH 262 MG PO CHEW: 524.0000 mg | CHEWABLE_TABLET | Freq: Four times a day (QID) | ORAL | 0 refills | Status: DC

## 2020-02-21 MED ORDER — METRONIDAZOLE 500 MG PO TABS: 500.0000 mg | ORAL_TABLET | Freq: Four times a day (QID) | ORAL | 0 refills | Status: DC

## 2020-02-21 MED FILL — SM STOMACH RELIEF CAPLET: 262 | 10 days supply | Qty: 80 | Fill #0

## 2020-02-21 MED FILL — METRONIDAZOLE 500 MG TABS: 500 | 14 days supply | Qty: 56 | Fill #0

## 2020-02-21 MED FILL — TETRACYCLINE HCL 500 MG CAP: 500 | 14 days supply | Qty: 56 | Fill #0

## 2020-02-21 NOTE — Telephone Encounter (Signed)
First attempt TCM call. Left message for pt to call back.

## 2020-02-21 NOTE — Telephone Encounter (Signed)
Transition Care Management Follow-up Telephone Call  Date of discharge and from where: 02/20/20-[facility]  How have you been since you were released from the hospital? ok  Any questions or concerns? No  Items Reviewed:  Did the pt receive and understand the discharge instructions provided? Yes   Medications obtained and verified? Yes   Other? Yes   Any new allergies since your discharge? No   Dietary orders reviewed? Yes  Do you have support at home? Yes   Home Care and Equipment/Supplies: Were home health services ordered? no If so, what is the name of the agency? n/a  Has the agency set up a time to come to the patient's home? not applicable Were any new equipment or medical supplies ordered?  No What is the name of the medical supply agency? n/a Were you able to get the supplies/equipment? not applicable Do you have any questions related to the use of the equipment or supplies? No  Functional Questionnaire: (I = Independent and D = Dependent) ADLs: I  Bathing/Dressing- I  Meal Prep- I  Eating- I  Maintaining continence- I  Transferring/Ambulation- I  Managing Meds- I  Follow up appointments reviewed:   PCP Hospital f/u appt confirmed? Yes  Scheduled to see Dr. Birdie Riddle on 02/27/2020 @ 11:00.  [facility] f/u appt confirmed? No  Patient to schedule  Are transportation arrangements needed? No   If their condition worsens, is the pt aware to call PCP or go to the Emergency Dept.? Yes  Was the patient provided with contact information for the PCP's office or ED? Yes  Was to pt encouraged to call back with questions or concerns? Yes

## 2020-02-24 ENCOUNTER — Encounter (HOSPITAL_COMMUNITY): Payer: Self-pay | Admitting: Gastroenterology

## 2020-02-27 ENCOUNTER — Other Ambulatory Visit: Payer: Self-pay

## 2020-02-27 ENCOUNTER — Ambulatory Visit: Payer: PRIVATE HEALTH INSURANCE | Admitting: Family Medicine

## 2020-02-27 ENCOUNTER — Encounter: Payer: Self-pay | Admitting: Family Medicine

## 2020-02-27 VITALS — BP 134/80 | HR 68 | Temp 99.1°F | Resp 16 | Ht 71.0 in | Wt 248.4 lb

## 2020-02-27 DIAGNOSIS — K250 Acute gastric ulcer with hemorrhage: Secondary | ICD-10-CM

## 2020-02-27 DIAGNOSIS — K2980 Duodenitis without bleeding: Secondary | ICD-10-CM

## 2020-02-27 DIAGNOSIS — B9681 Helicobacter pylori [H. pylori] as the cause of diseases classified elsewhere: Secondary | ICD-10-CM | POA: Insufficient documentation

## 2020-02-27 LAB — CBC WITH DIFFERENTIAL/PLATELET
Basophils Absolute: 0.1 10*3/uL (ref 0.0–0.1)
Basophils Relative: 1.2 % (ref 0.0–3.0)
Eosinophils Absolute: 0.1 10*3/uL (ref 0.0–0.7)
Eosinophils Relative: 1.1 % (ref 0.0–5.0)
HCT: 32 % — ABNORMAL LOW (ref 39.0–52.0)
Hemoglobin: 10.7 g/dL — ABNORMAL LOW (ref 13.0–17.0)
Lymphocytes Relative: 23 % (ref 12.0–46.0)
Lymphs Abs: 1.6 10*3/uL (ref 0.7–4.0)
MCHC: 33.5 g/dL (ref 30.0–36.0)
MCV: 87.2 fl (ref 78.0–100.0)
Monocytes Absolute: 0.5 10*3/uL (ref 0.1–1.0)
Monocytes Relative: 7.1 % (ref 3.0–12.0)
Neutro Abs: 4.7 10*3/uL (ref 1.4–7.7)
Neutrophils Relative %: 67.6 % (ref 43.0–77.0)
Platelets: 250 10*3/uL (ref 150.0–400.0)
RBC: 3.67 Mil/uL — ABNORMAL LOW (ref 4.22–5.81)
RDW: 15 % (ref 11.5–15.5)
WBC: 7 10*3/uL (ref 4.0–10.5)

## 2020-02-27 LAB — BASIC METABOLIC PANEL
BUN: 15 mg/dL (ref 6–23)
CO2: 29 mEq/L (ref 19–32)
Calcium: 9.2 mg/dL (ref 8.4–10.5)
Chloride: 105 mEq/L (ref 96–112)
Creatinine, Ser: 1.6 mg/dL — ABNORMAL HIGH (ref 0.40–1.50)
GFR: 49.79 mL/min — ABNORMAL LOW (ref >60.00)
Glucose, Bld: 81 mg/dL (ref 70–99)
Potassium: 4 mEq/L (ref 3.5–5.1)
Sodium: 139 mEq/L (ref 135–145)

## 2020-02-27 NOTE — Progress Notes (Signed)
   Subjective:    Patient ID: William Leon, male    DOB: Mar 11, 1971, 49 y.o.   MRN: [id number]  [facility] f/u- Pt was admitted 11/8-11/10 w/ melena/acute GI bleed.  He was started on IV protonix and GI was consulted.  He had EGD on 11/9 w/ gastritis and 2 gastric ulcers, also w/ duodenitis.  Hgb dropped to 10.3 but stabilized.  Placed on PPI BID x12 weeks.  Pathology showed that his ulcers had H pylori and medications were sent.  Pt was unaware of the H pylori and the treatment that is waiting for him.  He was frustrated and confused today.  Denies current bleeding, abd pain, N/V.  Reviewed endoscopy, pathology results, lab results, H&P, and d/c summary.  Reviewed past medical, surgical, family and social histories.    Review of Systems For ROS see HPI   This visit occurred during the SARS-CoV-2 public health emergency.  Safety protocols were in place, including screening questions prior to the visit, additional usage of staff PPE, and extensive cleaning of exam room while observing appropriate contact time as indicated for disinfecting solutions.       Objective:   Physical Exam Vitals reviewed.  Constitutional:      General: He is not in acute distress.    Appearance: Normal appearance. He is well-developed.  HENT:     Head: Normocephalic and atraumatic.  Eyes:     Conjunctiva/sclera: Conjunctivae normal.     Pupils: Pupils are equal, round, and reactive to light.  Neck:     Thyroid: No thyromegaly.  Cardiovascular:     Rate and Rhythm: Normal rate and regular rhythm.     Heart sounds: Normal heart sounds. No murmur heard.   Pulmonary:     Effort: Pulmonary effort is normal. No respiratory distress.     Breath sounds: Normal breath sounds.  Abdominal:     General: Bowel sounds are normal. There is no distension.     Palpations: Abdomen is soft.  Musculoskeletal:     Cervical back: Normal range of motion and neck supple.     Right lower leg: No edema.     Left lower leg:  No edema.  Lymphadenopathy:     Cervical: No cervical adenopathy.  Skin:    General: Skin is warm and dry.  Neurological:     Mental Status: He is alert and oriented to person, place, and time.     Cranial Nerves: No cranial nerve deficit.  Psychiatric:        Behavior: Behavior normal.           Assessment & Plan:

## 2020-02-27 NOTE — Patient Instructions (Signed)
Schedule your complete physical for January We'll notify you of your lab results and make any changes if needed Pick up the Tetracycline, Metronidazole, and Bismuth at the pharmacy (for H Pylori infection) Continue the Pantoprazole twice daily to decrease acid production AVOID ibuprofen, Goody's, BCs, etc You can take Tylenol as needed Call with any questions or concerns Stay Safe!  Stay Healthy!

## 2020-03-10 ENCOUNTER — Ambulatory Visit: Payer: PRIVATE HEALTH INSURANCE | Attending: Internal Medicine

## 2020-03-10 ENCOUNTER — Other Ambulatory Visit (HOSPITAL_BASED_OUTPATIENT_CLINIC_OR_DEPARTMENT_OTHER): Payer: Self-pay | Admitting: Internal Medicine

## 2020-03-10 DIAGNOSIS — Z23 Encounter for immunization: Secondary | ICD-10-CM

## 2020-03-10 MED FILL — MODERNA COVID-19 VACCINE 10: 100 | 1 days supply | Qty: 0 | Fill #0

## 2020-03-10 NOTE — Assessment & Plan Note (Signed)
New.  Reviewed dx w/ pt and medications he needs to avoid at this time- particularly NSAIDs.  He is aware of this.  Will continue BID PPI as directed.  Is supposed to have GI f/u upcoming.  Reviewed sxs that should prompt immediate return- dark, tarry stools, BRBPR, abd pain, fatigue, dizziness, SOB.  Pt expressed understanding and is in agreement w/ plan.

## 2020-03-10 NOTE — Progress Notes (Signed)
Dr. Birdie Riddle,  My apologizes that the patient was not aware of the results or our recommendations. I have included our nurse manager, Barb Merino, on this message so that our protocols for communications with patients are reviewed. Many, many thanks for the follow-up. Joelene Millin

## 2020-03-10 NOTE — Assessment & Plan Note (Signed)
New.  Pt was not aware of this diagnosis and was not aware that there were medications he needed to pick up.  Reviewed the chart to determine that dx was made based on pathology results acquired at the time of endoscopy.  He was supposed to be called by GI but unfortunately this didn't happen and they relied on MyChart to make him aware.  He admits he read the MyChart but found it confusing and he didn't know what he was reading.  Encouraged him to pick up the medication and explained quad therapy to him.  Pt agreeable to this.  Will f/u w/ GI as directed.

## 2020-03-10 NOTE — Progress Notes (Signed)
° °  Covid-19 Vaccination Clinic  Name:  Armstead Heiland    MRN: [id number] DOB: 1970-06-17  03/10/2020  Mr. Koppelman was observed post Covid-19 immunization for 15 minutes without incident. He was provided with Vaccine Information Sheet and instruction to access the V-Safe system.   Mr. Craine was instructed to call 911 with any severe reactions post vaccine:  Difficulty breathing   Swelling of face and throat   A fast heartbeat   A bad rash all over body   Dizziness and weakness   Immunizations Administered    No immunizations on file.

## 2020-03-11 ENCOUNTER — Other Ambulatory Visit: Payer: Self-pay

## 2020-03-11 ENCOUNTER — Ambulatory Visit (INDEPENDENT_AMBULATORY_CARE_PROVIDER_SITE_OTHER): Payer: PRIVATE HEALTH INSURANCE

## 2020-03-11 DIAGNOSIS — R7989 Other specified abnormal findings of blood chemistry: Secondary | ICD-10-CM

## 2020-03-11 LAB — BASIC METABOLIC PANEL
BUN: 16 mg/dL (ref 6–23)
CO2: 31 mEq/L (ref 19–32)
Calcium: 9.3 mg/dL (ref 8.4–10.5)
Chloride: 103 mEq/L (ref 96–112)
Creatinine, Ser: 1.64 mg/dL — ABNORMAL HIGH (ref 0.40–1.50)
GFR: 48.32 mL/min — ABNORMAL LOW (ref >60.00)
Glucose, Bld: 90 mg/dL (ref 70–99)
Potassium: 4.2 mEq/L (ref 3.5–5.1)
Sodium: 138 mEq/L (ref 135–145)

## 2020-03-12 ENCOUNTER — Other Ambulatory Visit: Payer: Self-pay

## 2020-03-12 ENCOUNTER — Telehealth: Payer: Self-pay | Admitting: Family Medicine

## 2020-03-12 DIAGNOSIS — R7989 Other specified abnormal findings of blood chemistry: Secondary | ICD-10-CM

## 2020-03-12 NOTE — Telephone Encounter (Signed)
Patient wants to know if he should stop the medication for his ulcers.  He has taken 2 weeks worth of the ulcer medication today.  Please advise.

## 2020-03-13 NOTE — Telephone Encounter (Signed)
Please advise 

## 2020-03-13 NOTE — Telephone Encounter (Signed)
He should take the Bismuth, Tetracycline, and Flagyl until he is out of medication.  The Pantoprazole (Protonix) he should continue until he sees GI and then they can decide

## 2020-03-17 NOTE — Telephone Encounter (Signed)
Called patient and went over Dr. Virgil Benedict recommendation. Pt has no further questions.

## 2020-03-20 ENCOUNTER — Encounter: Payer: Self-pay | Admitting: Family Medicine

## 2020-03-20 NOTE — Telephone Encounter (Signed)
If continuing needs evaluation. Thankfully no congestion or SOB but need to look into things. Urgent Care or ER this evening for any worsening symptoms until he can be seen in office.

## 2020-03-24 ENCOUNTER — Other Ambulatory Visit: Payer: Self-pay

## 2020-03-24 ENCOUNTER — Telehealth: Payer: Self-pay

## 2020-03-24 NOTE — Telephone Encounter (Signed)
Nurse Assessment Nurse: Gildardo Pounds, RN, Amy Date/Time Eilene Ghazi Time): 03/24/2020 11:34:22 AM Confirm and document reason for call. If symptomatic, describe symptoms. ---Caller states he is having chest pain feels pressure with deep pain and tightness. It started last week. It is only when he takes a deep breath or coughs. When he inhales he can feel the pain. It is more on the right side close to the center, in the pectoral area. He had a kidney appt. Friday & when the doctor listened, they did not hear any congestion. Does the patient have any new or worsening symptoms? ---Yes Will a triage be completed? ---Yes Related visit to physician within the last 2 weeks? ---No Does the PT have any chronic conditions? (i.e. diabetes, asthma, this includes High risk factors for pregnancy, etc.) ---No Is this a behavioral health or substance abuse call? ---No Guidelines Guideline Title Affirmed Question Affirmed Notes Nurse Date/Time Eilene Ghazi Time) Chest Pain [1] Chest pain lasts < 5 minutes AND [2] NO chest pain or cardiac symptoms (e.g., breathing difficulty, sweating) now (Exception: chest pains that last only a few seconds) Lovelace, RN, Amy 03/24/2020 11:37:11 AM PLEASE NOTE: All timestamps contained within this report are represented as [place] Standard Time. CONFIDENTIALTY NOTICE: This fax transmission is intended only for the addressee. It contains information that is legally privileged, confidential or otherwise protected from use or disclosure. If you are not the intended recipient, you are strictly prohibited from reviewing, disclosing, copying using or disseminating any of this information or taking any action in reliance on or regarding this information. If you have received this fax in error, please notify us immediately by telephone so that we can arrange for its return to [place]. Phone: [phone], Toll-Free: [phone], Fax: [phone] Page: 2 of 2 Call Id: [id number] [place].  Time Eilene Ghazi Time) Disposition Final User 03/24/2020 11:29:20 AM Send to Urgent Queue Learta Codding 03/24/2020 11:40:38 AM See PCP within 24 Hours Yes Lovelace, RN, Amy Caller Disagree/Comply Comply Caller Understands Yes PreDisposition InappropriateToAsk Care Advice Given Per Guideline * Difficulty breathing occurs * Chest pain lasts over 5 minutes * You become worse * Chest pain increases in frequency, duration or severity CALL BACK IF: SEE PCP WITHIN 24 HOURS: * IF OFFICE WILL BE OPEN: You need to be examined within the next 24 hours. Call your doctor (or NP/PA) when the office opens and make an appointment. CARE ADVICE given per Chest Pain (Adult) guideline. Comments User: Wayne Sever, RN Date/Time Eilene Ghazi Time): 03/24/2020 11:43:17 AM Warm transferred caller to [facility] at the office to make an appt. Referrals REFERRED TO PCP OFFIC

## 2020-03-25 ENCOUNTER — Ambulatory Visit: Payer: PRIVATE HEALTH INSURANCE | Admitting: Family Medicine

## 2020-03-25 ENCOUNTER — Encounter: Payer: Self-pay | Admitting: Family Medicine

## 2020-03-25 VITALS — BP 138/80 | HR 68 | Temp 98.5°F | Resp 20 | Ht 71.0 in | Wt 247.2 lb

## 2020-03-25 DIAGNOSIS — R0789 Other chest pain: Secondary | ICD-10-CM | POA: Diagnosis not present

## 2020-03-25 MED ORDER — ROSUVASTATIN CALCIUM 10 MG PO TABS
10.0000 mg | ORAL_TABLET | Freq: Every day | ORAL | 1 refills | Status: DC
Start: 2020-03-25 — End: 2020-09-24

## 2020-03-25 NOTE — Progress Notes (Signed)
   Subjective:    Patient ID: William Leon, male    DOB: Oct 16, 1970, 49 y.o.   MRN: [id number]  HPI CP- pt went home for lunch last week and laid back on the steps and fell asleep.  After he woke, he noted pain to R side of sternum.  Pain worsened when taking a deep breath in and w/ certain movements.  No pain w/ exertion.  No associated SOB, dizziness, diaphoresis, N/V.  Pain is '90% better' than last week and has improved w/o intervention.  No TTP.  Able to take deep breaths today w/ minimal discomfort.   Review of Systems For ROS see HPI   This visit occurred during the SARS-CoV-2 public health emergency.  Safety protocols were in place, including screening questions prior to the visit, additional usage of staff PPE, and extensive cleaning of exam room while observing appropriate contact time as indicated for disinfecting solutions.       Objective:   Physical Exam Vitals reviewed.  Constitutional:      General: He is not in acute distress.    Appearance: He is well-developed and well-nourished.  HENT:     Head: Normocephalic and atraumatic.  Eyes:     Extraocular Movements: EOM normal.     Conjunctiva/sclera: Conjunctivae normal.     Pupils: Pupils are equal, round, and reactive to light.  Neck:     Thyroid: No thyromegaly.  Cardiovascular:     Rate and Rhythm: Normal rate and regular rhythm.     Pulses: Intact distal pulses.     Heart sounds: Normal heart sounds. No murmur heard.   Pulmonary:     Effort: Pulmonary effort is normal. No respiratory distress.     Breath sounds: Normal breath sounds.  Chest:     Chest wall: No deformity, tenderness or crepitus.  Abdominal:     General: Bowel sounds are normal. There is no distension.     Palpations: Abdomen is soft.  Musculoskeletal:        General: No edema.     Cervical back: Normal range of motion and neck supple.  Lymphadenopathy:     Cervical: No cervical adenopathy.  Skin:    General: Skin is warm and dry.   Neurological:     Mental Status: He is alert and oriented to person, place, and time.     Cranial Nerves: No cranial nerve deficit.  Psychiatric:        Mood and Affect: Mood and affect normal.        Behavior: Behavior normal.           Assessment & Plan:  CP- new.  Self limited and resolving spontaneously.  Suspect this was musculoskeletal given the strange position he fell asleep in.  No red flags for cardiac issues or PE.  Reviewed supportive care and red flags that should prompt return.  Pt expressed understanding and is in agreement w/ plan.

## 2020-03-25 NOTE — Patient Instructions (Addendum)
Follow up as needed or as scheduled I think this was a weird muscular thing that caused your chest pain Thankfully it's getting better on its own- which would not happen if it's a clot Restart the Crestor Call with any questions or concerns Stay Safe!  Stay Healthy! Happy Holidays!

## 2020-04-21 ENCOUNTER — Other Ambulatory Visit: Payer: Self-pay | Admitting: Gastroenterology

## 2020-04-21 ENCOUNTER — Encounter: Payer: Self-pay | Admitting: Gastroenterology

## 2020-04-21 ENCOUNTER — Other Ambulatory Visit (INDEPENDENT_AMBULATORY_CARE_PROVIDER_SITE_OTHER): Payer: PRIVATE HEALTH INSURANCE

## 2020-04-21 ENCOUNTER — Ambulatory Visit: Payer: PRIVATE HEALTH INSURANCE | Admitting: Gastroenterology

## 2020-04-21 VITALS — BP 148/84 | HR 65 | Ht 71.0 in | Wt 257.0 lb

## 2020-04-21 DIAGNOSIS — D649 Anemia, unspecified: Secondary | ICD-10-CM

## 2020-04-21 DIAGNOSIS — K219 Gastro-esophageal reflux disease without esophagitis: Secondary | ICD-10-CM | POA: Diagnosis not present

## 2020-04-21 DIAGNOSIS — A048 Other specified bacterial intestinal infections: Secondary | ICD-10-CM

## 2020-04-21 DIAGNOSIS — K250 Acute gastric ulcer with hemorrhage: Secondary | ICD-10-CM

## 2020-04-21 LAB — CBC WITH DIFFERENTIAL/PLATELET
Basophils Absolute: 0.1 10*3/uL (ref 0.0–0.1)
Basophils Relative: 0.8 % (ref 0.0–3.0)
Eosinophils Absolute: 0.2 10*3/uL (ref 0.0–0.7)
Eosinophils Relative: 2.2 % (ref 0.0–5.0)
HCT: 40.3 % (ref 39.0–52.0)
Hemoglobin: 13 g/dL (ref 13.0–17.0)
Lymphocytes Relative: 24.5 % (ref 12.0–46.0)
Lymphs Abs: 2.3 10*3/uL (ref 0.7–4.0)
MCHC: 32.2 g/dL (ref 30.0–36.0)
MCV: 76.3 fl — ABNORMAL LOW (ref 78.0–100.0)
Monocytes Absolute: 0.8 10*3/uL (ref 0.1–1.0)
Monocytes Relative: 8.1 % (ref 3.0–12.0)
Neutro Abs: 6 10*3/uL (ref 1.4–7.7)
Neutrophils Relative %: 64.4 % (ref 43.0–77.0)
Platelets: 210 10*3/uL (ref 150.0–400.0)
RBC: 5.29 Mil/uL (ref 4.22–5.81)
RDW: 17.9 % — ABNORMAL HIGH (ref 11.5–15.5)
WBC: 9.3 10*3/uL (ref 4.0–10.5)

## 2020-04-21 MED ORDER — PANTOPRAZOLE SODIUM 20 MG PO TBEC: 20.0000 mg | DELAYED_RELEASE_TABLET | Freq: Every day | ORAL | 0 refills | Status: DC

## 2020-04-21 MED FILL — PANTOPRAZOLE SOD DR 20 MG T: 20 | 28 days supply | Qty: 28 | Fill #0

## 2020-04-21 NOTE — Patient Instructions (Addendum)
If you are age 52 or older, your body mass index should be between 23-30. Your Body mass index is 35.84 kg/m. If this is out of the aforementioned range listed, please consider follow up with your Primary Care Provider.  If you are age 59 or younger, your body mass index should be between 19-25. Your Body mass index is 35.84 kg/m. If this is out of the aformentioned range listed, please consider follow up with your Primary Care Provider.   You have been scheduled for an endoscopy. Please follow written instructions given to you at your visit today. If you use inhalers (even only as needed), please bring them with you on the day of your procedure.  Please go to the lab in the basement of our building to have lab work done as you leave today. Hit "B" for basement when you get on the elevator.  When the doors open the lab is on your left.  We will call you with the results. Thank you.  Due to recent changes in healthcare laws, you may see the results of your imaging and laboratory studies on MyChart before your provider has had a chance to review them.  We understand that in some cases there may be results that are confusing or concerning to you. Not all laboratory results come back in the same time frame and the provider may be waiting for multiple results in order to interpret others.  Please give [place] 48 hours in order for your provider to thoroughly review all the results before contacting the office for clarification of your results.    We have sent the following medications to your pharmacy for you to pick up at your convenience: Protonix 20 mg :Take once daily for 4 weeks  Thank you for entrusting me with your care and for choosing [employer], Dr. [place] Cellar

## 2020-04-21 NOTE — Progress Notes (Signed)
HPI :  50 year old male here for follow-up visit for gastric ulcer.  I know him from prior colonoscopy in January where he had multiple adenomas removed and was recommended to have another colonoscopy in 3 years for surveillance purposes.  He called me in November requesting advice for change in stool color.  He states his stomach was upset and developed multiple dark bowel movements concerning for melena.  He was instructed to go to the hospital.  His initial hemoglobin was 13 but down trended to tens.  BUN was elevated.  My partner Dr. Tarri Glenn performed an upper endoscopy and he was found to have 2 gastric ulcers that were clean-based, largest was 10 mm in size.  He also had gastritis noted.  Biopsies of the stomach were positive for H. pylori and it was recommended that he undergo H. pylori treatment.  He was treated with Protonix 40 mg twice daily, also completed a 10-day course of bismuth, tetracycline, and Flagyl to treat the H. pylori.  He states he completed the regimen.  He states he took Protonix for 4 weeks but then ran out and has not resumed it since then.  He denies any recurrence of symptoms since his hospitalization.  He is having normal bowel habits.  No obvious blood in the stools.  He has been avoiding NSAIDs.  There was a report of NSAID use prior to his hospitalization however he states he takes about 6 Goody powders per entire year and denied really taking much of that before his hospitalization.  Of note he was noted to have a mild bump in his creatinine during his admission he was referred to see nephrology.  He states he followed up with nephrology and they told him he did not have any problems with his kidneys that he is aware of.  I do not see follow-up labs on his renal function in epic.  He has questions about plans moving forward for this, follow-up endoscopy, if he needs to take any more Protonix.  He does state he has been having reflux that is been bothering him in  recent days, sometimes he eats spicy or other particular foods that can precipitate some of the symptoms.  He has not been taking anything for reflux.  He otherwise feels well without complaints today.  EGD 02/19/20 -  A medium-sized hiatal hernia was present. No Cameron's erosions. Diffuse mild inflammation characterized by erythema, friability and granularity was found in the gastric body. Biopsies were taken from the antrum, body, and fundus with a cold forceps for histology. Estimated blood loss was minimal. Two non-bleeding cratered gastric ulcers with no stigmata of bleeding were found in the gastric antrum. The larger ulcer had some overlying heme after being touched by the scope, but there was no active bleeding. The largest lesion was at least 10 mm in largest dimension. Mild erythema present in the duodenal bulb. The exam was otherwise without abnormality. No active bleeding seen. No blood present in the examined GI tact.  Biopsies positive for H pylori   Colonoscopy 05/04/19 - The perianal and digital rectal examinations were normal. - The terminal ileum appeared normal. - A 8 to 10 mm polyp was found in the cecum. The polyp was sessile. The polyp was removed with a cold snare. Resection and retrieval were complete. - Four sessile polyps were found in the ascending colon. The polyps were 3 to 7 mm in size. These polyps were removed with a cold snare. Resection and retrieval were  complete. - A 3 mm polyp was found in the transverse colon. The polyp was sessile. The polyp was removed with a cold snare. Resection and retrieval were complete. - Multiple small-mouthed diverticula were found in the sigmoid colon. - A 3 mm polyp was found in the rectum. The polyp was sessile. The polyp was removed with a cold snare. Resection and retrieval were complete. - Internal hemorrhoids were found during retroflexion. - The exam was otherwise without abnormality.  Adenomas, repeat in 3  years    Past Medical History:  Diagnosis Date  . Allergy    seasonal-ragweed  . Gastric ulcer   . H. pylori infection   . Hyperlipidemia      Past Surgical History:  Procedure Laterality Date  . ACHILLES TENDON SURGERY Right 01/2017  . BIOPSY  02/19/2020   Procedure: BIOPSY;  Surgeon: Thornton Park, MD;  Location: [facility];  Service: Gastroenterology;;  . ESOPHAGOGASTRODUODENOSCOPY (EGD) WITH PROPOFOL N/A 02/19/2020   Procedure: ESOPHAGOGASTRODUODENOSCOPY (EGD) WITH PROPOFOL;  Surgeon: Thornton Park, MD;  Location: [facility];  Service: Gastroenterology;  Laterality: N/A;  . NO PAST SURGERIES     Family History  Problem Relation Age of Onset  . Alcohol abuse Brother   . Bowel Disease Brother   . Colon cancer Neg Hx   . Colon polyps Neg Hx   . Esophageal cancer Neg Hx   . Rectal cancer Neg Hx   . Stomach cancer Neg Hx    Social History   Tobacco Use  . Smoking status: Never Smoker  . Smokeless tobacco: Never Used  Vaping Use  . Vaping Use: Never used  Substance Use Topics  . Alcohol use: No    Alcohol/week: 0.0 standard drinks  . Drug use: No   Current Outpatient Medications  Medication Sig Dispense Refill  . pantoprazole (PROTONIX) 20 MG tablet Take 1 tablet (20 mg total) by mouth daily for 28 days. 28 tablet 0  . rosuvastatin (CRESTOR) 10 MG tablet Take 1 tablet (10 mg total) by mouth daily. 90 tablet 1   No current facility-administered medications for this visit.   No Known Allergies   Review of Systems: All systems reviewed and negative except where noted in HPI.   Lab Results  Component Value Date   WBC 9.3 04/21/2020   HGB 13.0 04/21/2020   HCT 40.3 04/21/2020   MCV 76.3 (L) 04/21/2020   PLT 210.0 04/21/2020    CBC Latest Ref Rng & Units 04/21/2020 02/27/2020 02/20/2020  WBC 4.0 - 10.5 K/uL 9.3 7.0 7.5  Hemoglobin 13.0 - 17.0 g/dL 13.0 10.7(L) 10.3(L)  Hematocrit 39.0 - 52.0 % 40.3 32.0(L) 31.2(L)  Platelets 150.0 - 400.0 K/uL  210.0 250.0 143(L)   Lab Results  Component Value Date   CREATININE 1.64 (H) 03/11/2020   BUN 16 03/11/2020   NA 138 03/11/2020   K 4.2 03/11/2020   CL 103 03/11/2020   CO2 31 03/11/2020     Physical Exam: BP (!) 148/84   Pulse 65   Ht 5\' 11"  (1.803 m)   Wt 257 lb (116.6 kg)   BMI 35.84 kg/m  Constitutional: Pleasant,well-developed, male in no acute distress. Neurological: Alert and oriented to person place and time. Psychiatric: Normal mood and affect. Behavior is normal.   ASSESSMENT AND PLAN: 50 year old male here for reassessment of the following:  Gastric ulcer / H. Pylori / Anemia - patient is status post admission to the hospital in November with upper GI bleed from gastric ulcer which  is secondary to H. pylori, he denies any significant NSAID use.  He was treated for H. pylori as outlined above.  He was treated for the ulcer with Protonix but ran out of medication after 4 weeks.  He is otherwise asymptomatic at this time and feeling well.  Discussed options with him.  Recommend a upper endoscopy to reassess his stomach and ensure mucosal healing of this ulcer that caused bleeding.  During this time we will also take biopsies to check for H. pylori eradication.  I discussed endoscopy and anesthesia with him, risks and benefits, he wanted to proceed.  In the interim we repeated his CBC today and his hemoglobin has returned to previous baseline which is good news.  I will extend him another 4 weeks on Protonix 20 mg a day in the interim to ensure mucosal healing of the ulcer prior to EGD and for treatment of his recurrent reflux symptoms when he ran out of the medication.  All questions answered he agreed with the plan.  Recommendations pending results of his endoscopy.  GERD - some reflux symptoms bothering him intermittently, gave Protonix 20 mg to use daily as needed for the next month as above.  Sounds like he does not need this routinely, discussed long-term risk benefits of  chronic PPI use with him.  Short-term years this is very safe, long-term use there is increased risk for chronic kidney disease and with his renal function would try to avoid long-term use if possible.   [place] Cellar, MD [facility]

## 2020-04-22 ENCOUNTER — Ambulatory Visit: Payer: PRIVATE HEALTH INSURANCE | Admitting: Gastroenterology

## 2020-04-25 ENCOUNTER — Encounter: Payer: Self-pay | Admitting: Family Medicine

## 2020-04-28 ENCOUNTER — Encounter: Payer: PRIVATE HEALTH INSURANCE | Admitting: Family Medicine

## 2020-05-15 ENCOUNTER — Ambulatory Visit (AMBULATORY_SURGERY_CENTER): Payer: PRIVATE HEALTH INSURANCE | Admitting: Gastroenterology

## 2020-05-15 ENCOUNTER — Encounter: Payer: Self-pay | Admitting: Gastroenterology

## 2020-05-15 ENCOUNTER — Other Ambulatory Visit: Payer: Self-pay

## 2020-05-15 VITALS — BP 133/77 | HR 57 | Temp 98.7°F | Resp 18 | Ht 71.0 in | Wt 257.0 lb

## 2020-05-15 DIAGNOSIS — K449 Diaphragmatic hernia without obstruction or gangrene: Secondary | ICD-10-CM

## 2020-05-15 DIAGNOSIS — K250 Acute gastric ulcer with hemorrhage: Secondary | ICD-10-CM

## 2020-05-15 DIAGNOSIS — K2970 Gastritis, unspecified, without bleeding: Secondary | ICD-10-CM

## 2020-05-15 DIAGNOSIS — K319 Disease of stomach and duodenum, unspecified: Secondary | ICD-10-CM | POA: Diagnosis not present

## 2020-05-15 DIAGNOSIS — K3189 Other diseases of stomach and duodenum: Secondary | ICD-10-CM

## 2020-05-15 DIAGNOSIS — K2951 Unspecified chronic gastritis with bleeding: Secondary | ICD-10-CM

## 2020-05-15 MED ORDER — SODIUM CHLORIDE 0.9 % IV SOLN: 500.0000 mL | Freq: Once | INTRAVENOUS | Status: DC

## 2020-05-15 NOTE — Progress Notes (Signed)
Pt's states no medical or surgical changes since previsit or office visit.  VS CW  

## 2020-05-15 NOTE — Progress Notes (Signed)
A/ox3, pleased with MAC, report to RN 

## 2020-05-15 NOTE — Progress Notes (Signed)
Called to room to assist during endoscopic procedure.  Patient ID and intended procedure confirmed with present staff. Received instructions for my participation in the procedure from the performing physician.  

## 2020-05-15 NOTE — Patient Instructions (Addendum)
Handout was given to you on a hiatal hernia. Per Dr. Havery Moros continue taking PROTONIX once daily for now. You may resume your other current medications today. Await biopsy results.  May take 1-3 weeks to receive pathology results. Please call if any questions or concerns.      YOU HAD AN ENDOSCOPIC PROCEDURE TODAY AT [facility]:   Refer to the procedure report that was given to you for any specific questions about what was found during the examination.  If the procedure report does not answer your questions, please call your gastroenterologist to clarify.  If you requested that your care partner not be given the details of your procedure findings, then the procedure report has been included in a sealed envelope for you to review at your convenience later.  YOU SHOULD EXPECT: Some feelings of bloating in the abdomen. Passage of more gas than usual.  Walking can help get rid of the air that was put into your GI tract during the procedure and reduce the bloating. If you had a lower endoscopy (such as a colonoscopy or flexible sigmoidoscopy) you may notice spotting of blood in your stool or on the toilet paper. If you underwent a bowel prep for your procedure, you may not have a normal bowel movement for a few days.  Please Note:  You might notice some irritation and congestion in your nose or some drainage.  This is from the oxygen used during your procedure.  There is no need for concern and it should clear up in a day or so.  SYMPTOMS TO REPORT IMMEDIATELY:     Following upper endoscopy (EGD)  Vomiting of blood or coffee ground material  New chest pain or pain under the shoulder blades  Painful or persistently difficult swallowing  New shortness of breath  Fever of 100F or higher  Black, tarry-looking stools  For urgent or emergent issues, a gastroenterologist can be reached at any hour by calling [phone]. Do not use MyChart messaging for urgent concerns.     DIET:  We do recommend a small meal at first, but then you may proceed to your regular diet.  Drink plenty of fluids but you should avoid alcoholic beverages for 24 hours.  ACTIVITY:  You should plan to take it easy for the rest of today and you should NOT DRIVE or use heavy machinery until tomorrow (because of the sedation medicines used during the test).    FOLLOW UP: Our staff will call the number listed on your records 48-72 hours following your procedure to check on you and address any questions or concerns that you may have regarding the information given to you following your procedure. If we do not reach you, we will leave a message.  We will attempt to reach you two times.  During this call, we will ask if you have developed any symptoms of COVID 19. If you develop any symptoms (ie: fever, flu-like symptoms, shortness of breath, cough etc.) before then, please call [phone].  If you test positive for Covid 19 in the 2 weeks post procedure, please call and report this information to [place].    If any biopsies were taken you will be contacted by phone or by letter within the next 1-3 weeks.  Please call us at [phone] if you have not heard about the biopsies in 3 weeks.    SIGNATURES/CONFIDENTIALITY: You and/or your care partner have signed paperwork which will be entered into your electronic medical record.  These signatures attest to the fact that that the information above on your After Visit Summary has been reviewed and is understood.  Full responsibility of the confidentiality of this discharge information lies with you and/or your care-partner.

## 2020-05-15 NOTE — Op Note (Signed)
[place] Patient Name: William Leon Procedure Date: 05/15/2020 9:06 AM MRN: [id number] Endoscopist: Remo Lipps P. Havery Moros , MD Age: 50 Referring MD:  Date of Birth: [birth year] Gender: Male Account #: [id number] Procedure:                Upper GI endoscopy Indications:              history of gastric ulcer causing GI bleed in                            November 2021, tested positive for H pylori at that                            time and treated, here for surveillance endoscopy Medicines:                Monitored Anesthesia Care Procedure:                Pre-Anesthesia Assessment:                           - Prior to the procedure, a History and Physical                            was performed, and patient medications and                            allergies were reviewed. The patient's tolerance of                            previous anesthesia was also reviewed. The risks                            and benefits of the procedure and the sedation                            options and risks were discussed with the patient.                            All questions were answered, and informed consent                            was obtained. Prior Anticoagulants: The patient has                            taken no previous anticoagulant or antiplatelet                            agents. ASA Grade Assessment: II - A patient with                            mild systemic disease. After reviewing the risks                            and benefits, the patient was deemed in  satisfactory condition to undergo the procedure.                           After obtaining informed consent, the endoscope was                            passed under direct vision. Throughout the                            procedure, the patient's blood pressure, pulse, and                            oxygen saturations were monitored continuously. The                             Endoscope was introduced through the mouth, and                            advanced to the second part of duodenum. The upper                            GI endoscopy was accomplished without difficulty.                            The patient tolerated the procedure well. Scope In: Scope Out: Findings:                 Esophagogastric landmarks were identified: the                            Z-line was found at 38 cm, the gastroesophageal                            junction was found at 38 cm and the upper extent of                            the gastric folds was found at 40 cm from the                            incisors.                           A 2 cm hiatal hernia was present.                           The exam of the esophagus was otherwise normal.                           Localized moderately erythematous mucosa was found                            in the gastric antrum with some slight nodularity  at the site of prior ulcer. The previous ulcer has                            healed, suspect benign granulation tissue. Biopsies                            were taken with a cold forceps for histology.                           The exam of the stomach was otherwise normal.                           Biopsies were taken with a cold forceps in the                            gastric body, at the incisura and in the gastric                            antrum for Helicobacter pylori eradication testing.                           The duodenal bulb and second portion of the                            duodenum were normal. Complications:            No immediate complications. Estimated blood loss:                            Minimal. Estimated Blood Loss:     Estimated blood loss was minimal. Impression:               - Esophagogastric landmarks identified.                           - 2 cm hiatal hernia.                           - Normal esophagus otherwise                            - Interval healing of prior gastric ulcer. Some                            erythematous mucosa with slight nodularity at the                            site, benign appearing. Biopsied.                           - Normal stomach otherwise - biopsies taken for H                            pylori eradication testing.                           -  Normal duodenal bulb and second portion of the                            duodenum. Recommendation:           - Patient has a contact number available for                            emergencies. The signs and symptoms of potential                            delayed complications were discussed with the                            patient. Return to normal activities tomorrow.                            Written discharge instructions were provided to the                            patient.                           - Resume previous diet.                           - Continue present medications (continue protonix                            once daily for now).                           - Await pathology results with further                            recommendations Remo Lipps P. Havery Moros, MD 05/15/2020 9:24:56 AM This report has been signed electronically.

## 2020-05-15 NOTE — Progress Notes (Addendum)
Pt was asleep when Dr. Havery Moros came in to speak with him.  MD went ahead and preformed next procedure and pt will wait and speak with him when MD finishes.   No problems noted in the recovery room. maw

## 2020-05-19 ENCOUNTER — Telehealth: Payer: Self-pay

## 2020-05-19 NOTE — Telephone Encounter (Signed)
  Follow up Call-  Call back number 05/15/2020 05/04/2019  Post procedure Call Back phone  # [phone]  Permission to leave phone message Yes Yes  Some recent data might be hidden     Patient questions:  Do you have a fever, pain , or abdominal swelling? No. Pain Score  0 *  Have you tolerated food without any problems? Yes.    Have you been able to return to your normal activities? Yes.    Do you have any questions about your discharge instructions: Diet   No. Medications  No. Follow up visit  No.  Do you have questions or concerns about your Care? No.  Actions: * If pain score is 4 or above: No action needed, pain <4.  1. Have you developed a fever since your procedure? no  2.   Have you had an respiratory symptoms (SOB or cough) since your procedure? no  3.   Have you tested positive for COVID 19 since your procedure no  4.   Have you had any family members/close contacts diagnosed with the COVID 19 since your procedure?  no   If yes to any of these questions please route to Joylene John, RN and Joella Prince, RN

## 2020-05-23 ENCOUNTER — Encounter: Payer: Self-pay | Admitting: Family Medicine

## 2020-05-23 ENCOUNTER — Other Ambulatory Visit: Payer: Self-pay

## 2020-05-23 ENCOUNTER — Ambulatory Visit (INDEPENDENT_AMBULATORY_CARE_PROVIDER_SITE_OTHER): Payer: PRIVATE HEALTH INSURANCE | Admitting: Family Medicine

## 2020-05-23 VITALS — BP 120/80 | HR 83 | Temp 99.1°F | Resp 17 | Ht 71.0 in | Wt 244.6 lb

## 2020-05-23 DIAGNOSIS — Z125 Encounter for screening for malignant neoplasm of prostate: Secondary | ICD-10-CM | POA: Diagnosis not present

## 2020-05-23 DIAGNOSIS — E669 Obesity, unspecified: Secondary | ICD-10-CM

## 2020-05-23 DIAGNOSIS — Z0000 Encounter for general adult medical examination without abnormal findings: Secondary | ICD-10-CM

## 2020-05-23 LAB — CBC WITH DIFFERENTIAL/PLATELET
Basophils Absolute: 0.1 10*3/uL (ref 0.0–0.1)
Basophils Relative: 1 % (ref 0.0–3.0)
Eosinophils Absolute: 0.2 10*3/uL (ref 0.0–0.7)
Eosinophils Relative: 3 % (ref 0.0–5.0)
HCT: 43.7 % (ref 39.0–52.0)
Hemoglobin: 14 g/dL (ref 13.0–17.0)
Lymphocytes Relative: 25.4 % (ref 12.0–46.0)
Lymphs Abs: 1.9 10*3/uL (ref 0.7–4.0)
MCHC: 32.1 g/dL (ref 30.0–36.0)
MCV: 73.2 fl — ABNORMAL LOW (ref 78.0–100.0)
Monocytes Absolute: 0.5 10*3/uL (ref 0.1–1.0)
Monocytes Relative: 7 % (ref 3.0–12.0)
Neutro Abs: 4.6 10*3/uL (ref 1.4–7.7)
Neutrophils Relative %: 63.6 % (ref 43.0–77.0)
Platelets: 210 10*3/uL (ref 150.0–400.0)
RBC: 5.97 Mil/uL — ABNORMAL HIGH (ref 4.22–5.81)
RDW: 18.3 % — ABNORMAL HIGH (ref 11.5–15.5)
WBC: 7.3 10*3/uL (ref 4.0–10.5)

## 2020-05-23 LAB — BASIC METABOLIC PANEL
BUN: 19 mg/dL (ref 6–23)
CO2: 27 mEq/L (ref 19–32)
Calcium: 9.9 mg/dL (ref 8.4–10.5)
Chloride: 105 mEq/L (ref 96–112)
Creatinine, Ser: 1.64 mg/dL — ABNORMAL HIGH (ref 0.40–1.50)
GFR: 48.25 mL/min — ABNORMAL LOW (ref >60.00)
Glucose, Bld: 109 mg/dL — ABNORMAL HIGH (ref 70–99)
Potassium: 4 mEq/L (ref 3.5–5.1)
Sodium: 139 mEq/L (ref 135–145)

## 2020-05-23 LAB — HEPATIC FUNCTION PANEL
ALT: 31 U/L (ref 0–53)
AST: 34 U/L (ref 0–37)
Albumin: 4.3 g/dL (ref 3.5–5.2)
Alkaline Phosphatase: 64 U/L (ref 39–117)
Bilirubin, Direct: 0.1 mg/dL (ref 0.0–0.3)
Total Bilirubin: 0.5 mg/dL (ref 0.2–1.2)
Total Protein: 7.5 g/dL (ref 6.0–8.3)

## 2020-05-23 LAB — LIPID PANEL
Cholesterol: 118 mg/dL (ref 0–200)
HDL: 31.4 mg/dL — ABNORMAL LOW (ref >39.00)
NonHDL: 86.38
Total CHOL/HDL Ratio: 4
Triglycerides: 202 mg/dL — ABNORMAL HIGH (ref 0.0–149.0)
VLDL: 40.4 mg/dL — ABNORMAL HIGH (ref 0.0–40.0)

## 2020-05-23 LAB — LDL CHOLESTEROL, DIRECT: Direct LDL: 73 mg/dL

## 2020-05-23 LAB — PSA: PSA: 0.18 ng/mL (ref 0.10–4.00)

## 2020-05-23 LAB — TSH: TSH: 1.42 u[IU]/mL (ref 0.35–4.50)

## 2020-05-23 NOTE — Progress Notes (Signed)
   Subjective:    Patient ID: William Leon, male    DOB: [birth year], 50 y.o.   MRN: [id number]  HPI CPE- UTD on colonoscopy, Tdap, flu, COVID.  Reviewed past medical, surgical, family and social histories.   Health Maintenance  Topic Date Due  . Hepatitis C Screening  03/25/2021 (Originally 1970-04-24)  . TETANUS/TDAP  11/03/2021  . COLONOSCOPY (Pts 45-62yrs Insurance coverage will need to be confirmed)  05/03/2022  . INFLUENZA VACCINE  Completed  . COVID-19 Vaccine  Completed  . HIV Screening  Completed      Review of Systems Patient reports no vision/hearing changes, anorexia, fever ,adenopathy, persistant/recurrent hoarseness, swallowing issues, chest pain, palpitations, edema, persistant/recurrent cough, hemoptysis, dyspnea (rest,exertional, paroxysmal nocturnal), gastrointestinal  bleeding (melena, rectal bleeding), abdominal pain, excessive heart burn, GU symptoms (dysuria, hematuria, voiding/incontinence issues) syncope, focal weakness, memory loss, numbness & tingling, skin/hair/nail changes, depression, anxiety, abnormal bruising/bleeding, musculoskeletal symptoms/signs.   This visit occurred during the SARS-CoV-2 public health emergency.  Safety protocols were in place, including screening questions prior to the visit, additional usage of staff PPE, and extensive cleaning of exam room while observing appropriate contact time as indicated for disinfecting solutions.       Objective:   Physical Exam General Appearance:    Alert, cooperative, no distress, appears stated age  Head:    Normocephalic, without obvious abnormality, atraumatic  Eyes:    PERRL, conjunctiva/corneas clear, EOM's intact, fundi    benign, both eyes       Ears:    Normal TM's and external ear canals, both ears  Nose:   Deferred due to COVID  Throat:   Neck:   Supple, symmetrical, trachea midline, no adenopathy;       thyroid:  No enlargement/tenderness/nodules  Back:     Symmetric, no curvature, ROM  normal, no CVA tenderness  Lungs:     Clear to auscultation bilaterally, respirations unlabored  Chest wall:    No tenderness or deformity  Heart:    Regular rate and rhythm, S1 and S2 normal, no murmur, rub   or gallop  Abdomen:     Soft, non-tender, bowel sounds active all four quadrants,    no masses, no organomegaly  Genitalia:    deferred  Rectal:    Extremities:   Extremities normal, atraumatic, no cyanosis or edema  Pulses:   2+ and symmetric all extremities  Skin:   Skin color, texture, turgor normal, no rashes or lesions  Lymph nodes:   Cervical, supraclavicular, and axillary nodes normal  Neurologic:   CNII-XII intact. Normal strength, sensation and reflexes      throughout          Assessment & Plan:

## 2020-05-23 NOTE — Patient Instructions (Signed)
Follow up in 6 months to recheck cholesterol We'll notify you of your lab results and make any changes if needed Continue to work on healthy diet and regular exercise- you're doing great! Call with any questions or concerns Stay Safe!  Stay Healthy!

## 2020-05-23 NOTE — Assessment & Plan Note (Signed)
BMI 34.11  This is a bit misleading as it doesn't factor in pt's muscle mass (he is quite muscular).  Check labs to risk stratify.  Will follow.

## 2020-05-23 NOTE — Assessment & Plan Note (Signed)
Pt's PE WNL w/ exception of BMI.  BMI doesn't take into account pt's muscle mass but did encourage healthy diet and regular exercise.  UTD on colonoscopy, immunizations.  Check labs.  Anticipatory guidance provided.

## 2020-07-16 ENCOUNTER — Encounter: Payer: Self-pay | Admitting: Family Medicine

## 2020-07-17 ENCOUNTER — Other Ambulatory Visit: Payer: Self-pay

## 2020-07-17 ENCOUNTER — Telehealth: Payer: PRIVATE HEALTH INSURANCE | Admitting: Internal Medicine

## 2020-09-10 ENCOUNTER — Encounter: Payer: Self-pay | Admitting: Family Medicine

## 2020-09-10 ENCOUNTER — Other Ambulatory Visit: Payer: Self-pay

## 2020-09-10 ENCOUNTER — Telehealth: Payer: Self-pay | Admitting: Gastroenterology

## 2020-09-10 DIAGNOSIS — H00019 Hordeolum externum unspecified eye, unspecified eyelid: Secondary | ICD-10-CM

## 2020-09-10 DIAGNOSIS — R1031 Right lower quadrant pain: Secondary | ICD-10-CM

## 2020-09-10 NOTE — Telephone Encounter (Signed)
This would be unusual for RLQ pain to be caused by an ulcer, that would seem less likely, sounds like he has no bleeding symptoms as well which is good. Unclear what is causing the RLQ pain without evaluating him. We can do some basic labs to make sure stable - CBC, CMET. Not sure if any openings in the office to get him in this week if symptoms persist. Thanks

## 2020-09-10 NOTE — Telephone Encounter (Signed)
Spoke with patient , he states that yesterday his stools were "sticky" but denies dark stools. He states that the pain "has been here a while." Patient describes as pain in the right lower abdomen, describes as a sharp pain that has been constant for the last 2 days. No longer taking Protonix, no NSAID's, denies any nausea, vomiting, or weight loss. Patient is concerned that he may have another ulcer. Please advise, thank you.

## 2020-09-10 NOTE — Telephone Encounter (Signed)
Spoke with patient in regards to recommendations. Pt will come in tomorrow for lab work, he is aware that no appt is necessary and he can stop by at his convenience between 7:30 am and 5 PM. Patient has been scheduled to see Tye Savoy, NP on Friday, 09/12/20 at 1:30 PM. Patient verbalized understanding and had no concerns at the end of the call.  Lab orders in epic.

## 2020-09-12 ENCOUNTER — Ambulatory Visit: Payer: PRIVATE HEALTH INSURANCE | Admitting: Nurse Practitioner

## 2020-09-23 ENCOUNTER — Other Ambulatory Visit (HOSPITAL_BASED_OUTPATIENT_CLINIC_OR_DEPARTMENT_OTHER): Payer: Self-pay

## 2020-09-23 MED ORDER — BENZONATATE 200 MG PO CAPS
ORAL_CAPSULE | ORAL | 0 refills | Status: DC
  Filled 2020-09-23: qty 25, 9d supply, fill #0

## 2020-09-23 MED ORDER — IPRATROPIUM BROMIDE 0.03 % NA SOLN
NASAL | 0 refills | Status: DC
  Filled 2020-09-23: qty 30, 28d supply, fill #0

## 2020-09-23 NOTE — Telephone Encounter (Signed)
Inbound call from patient requesting a call back about an appointment and if labs are needed before then. He did not have labs done. Best contact number [phone]

## 2020-09-23 NOTE — Telephone Encounter (Signed)
Noted, thank you

## 2020-09-23 NOTE — Telephone Encounter (Addendum)
Lm on vm for patient to return call.   Patient will need lab work prior to his appt. Please schedule for next available appt with an APP.

## 2020-09-23 NOTE — Telephone Encounter (Signed)
Patient returned call. Scheduled an appointment 10/22/20. Advised him to be sure to get lab work done, no appointment needed.

## 2020-09-24 ENCOUNTER — Encounter: Payer: Self-pay | Admitting: Family Medicine

## 2020-09-24 ENCOUNTER — Other Ambulatory Visit (HOSPITAL_BASED_OUTPATIENT_CLINIC_OR_DEPARTMENT_OTHER): Payer: Self-pay

## 2020-09-24 ENCOUNTER — Telehealth (INDEPENDENT_AMBULATORY_CARE_PROVIDER_SITE_OTHER): Payer: PRIVATE HEALTH INSURANCE | Admitting: Family Medicine

## 2020-09-24 DIAGNOSIS — J069 Acute upper respiratory infection, unspecified: Secondary | ICD-10-CM | POA: Diagnosis not present

## 2020-09-24 MED ORDER — GUAIFENESIN-CODEINE 100-10 MG/5ML PO SYRP
10.0000 mL | ORAL_SOLUTION | Freq: Three times a day (TID) | ORAL | 0 refills | Status: DC | PRN
  Filled 2020-09-24: qty 120, 4d supply, fill #0

## 2020-09-24 MED ORDER — ROSUVASTATIN CALCIUM 10 MG PO TABS
10.0000 mg | ORAL_TABLET | Freq: Every day | ORAL | 1 refills | Status: DC
  Filled 2020-09-24: qty 90, 90d supply, fill #0

## 2020-09-24 NOTE — Progress Notes (Signed)
I connected with  [name] on 09/24/20 by a video enabled telemedicine application and verified that I am speaking with the correct person using two identifiers.   I discussed the limitations of evaluation and management by telemedicine. The patient expressed understanding and agreed to proceed.

## 2020-09-24 NOTE — Progress Notes (Signed)
   Virtual Visit via Video   I connected with patient on 09/24/20 at 11:30 AM EDT by a video enabled telemedicine application and verified that I am speaking with the correct person using two identifiers.  Location patient: Home Location provider: [facility], Office Persons participating in the virtual visit: Patient, Provider, [employer] (Sabrina M)  I discussed the limitations of evaluation and management by telemedicine and the availability of in person appointments. The patient expressed understanding and agreed to proceed.  Subjective:   HPI:   URI- pt has taken 3 COVID tests and all were negative.  Sxs started Monday.  No fevers.  Short lived, mild body aches that have resolved.  No HA.  Denies ear pain/pressure.  + nasal congestion.  + dry cough.  No known sick contacts.  Pt has been recently traveling.  Did a teledoc visit yesterday and was started on nasal spray and cough pills  ROS:   See pertinent positives and negatives per HPI.  Patient Active Problem List   Diagnosis Date Noted   H. pylori duodenitis 02/27/2020   Hypocalcemia 02/19/2020   Anemia    Gastric ulcer with hemorrhage    Melena 02/18/2020   Hyperlipidemia    Azotemia    Leukocytosis    Obesity (BMI 30-39.9) 04/11/2019   Plantar fasciitis 10/31/2017   Achilles tendon rupture, right, subsequent encounter 02/07/2017   Penile trauma 06/18/2014   Elevated serum creatinine 05/22/2014   Physical exam, annual 11/04/2011   TESTICULAR MASS, LEFT 10/22/2009   LENTIGO 10/01/2009    Social History   Tobacco Use   Smoking status: Never   Smokeless tobacco: Never  Substance Use Topics   Alcohol use: No    Alcohol/week: 0.0 standard drinks    Current Outpatient Medications:    benzonatate (TESSALON) 200 MG capsule, Take 1 capsule by mouth 3 times daily as needed for cough, Disp: 25 capsule, Rfl: 0   COVID-19 mRNA vaccine, Moderna, 100 MCG/0.5ML injection, INJECT AS DIRECTED, Disp: .25 mL, Rfl: 0    ipratropium (ATROVENT) 0.03 % nasal spray, Place 2 sprays into the nose 3 times daily, Disp: 30 mL, Rfl: 0   rosuvastatin (CRESTOR) 10 MG tablet, Take 1 tablet (10 mg total) by mouth daily., Disp: 90 tablet, Rfl: 1   Bismuth Subsalicylate 983 MG TABS, TAKE 2 TABLETS BY MOUTH 4 (FOUR) TIMES DAILY. (Patient not taking: Reported on 09/24/2020), Disp: 112 tablet, Rfl: 0   pantoprazole (PROTONIX) 20 MG tablet, TAKE 1 TABLET BY MOUTH ONCE DAILY (Patient not taking: Reported on 09/24/2020), Disp: 28 tablet, Rfl: 0  No Known Allergies  Objective:   There were no vitals taken for this visit. AAOx3, NAD NCAT, EOMI No obvious CN deficits Coloring WNL Pt is able to speak clearly, coherently without shortness of breath or increased work of breathing.  + dry cough Thought process is linear.  Mood is appropriate.   Assessment and Plan:   Viral URI- agree w/ yesterday's dx.  No need for abx.  Will start cough syrup to help w/ sleep at night.  Reviewed supportive care and flags that should prompt return.  Pt expressed understanding and is in agreement w/ plan.    Annye Asa, MD 09/24/2020

## 2020-10-08 ENCOUNTER — Encounter: Payer: Self-pay | Admitting: *Deleted

## 2020-10-21 ENCOUNTER — Other Ambulatory Visit (INDEPENDENT_AMBULATORY_CARE_PROVIDER_SITE_OTHER): Payer: No Typology Code available for payment source

## 2020-10-21 DIAGNOSIS — R1031 Right lower quadrant pain: Secondary | ICD-10-CM | POA: Diagnosis not present

## 2020-10-21 LAB — CBC WITH DIFFERENTIAL/PLATELET
Basophils Absolute: 0.1 10*3/uL (ref 0.0–0.1)
Basophils Relative: 0.8 % (ref 0.0–3.0)
Eosinophils Absolute: 0.3 10*3/uL (ref 0.0–0.7)
Eosinophils Relative: 4.1 % (ref 0.0–5.0)
HCT: 47 % (ref 39.0–52.0)
Hemoglobin: 15.8 g/dL (ref 13.0–17.0)
Lymphocytes Relative: 29 % (ref 12.0–46.0)
Lymphs Abs: 2.2 10*3/uL (ref 0.7–4.0)
MCHC: 33.6 g/dL (ref 30.0–36.0)
MCV: 80.8 fl (ref 78.0–100.0)
Monocytes Absolute: 0.6 10*3/uL (ref 0.1–1.0)
Monocytes Relative: 8.4 % (ref 3.0–12.0)
Neutro Abs: 4.4 10*3/uL (ref 1.4–7.7)
Neutrophils Relative %: 57.7 % (ref 43.0–77.0)
Platelets: 162 10*3/uL (ref 150.0–400.0)
RBC: 5.81 Mil/uL (ref 4.22–5.81)
RDW: 18.1 % — ABNORMAL HIGH (ref 11.5–15.5)
WBC: 7.6 10*3/uL (ref 4.0–10.5)

## 2020-10-21 LAB — COMPREHENSIVE METABOLIC PANEL
ALT: 30 U/L (ref 0–53)
AST: 32 U/L (ref 0–37)
Albumin: 4.2 g/dL (ref 3.5–5.2)
Alkaline Phosphatase: 59 U/L (ref 39–117)
BUN: 17 mg/dL (ref 6–23)
CO2: 24 mEq/L (ref 19–32)
Calcium: 9.8 mg/dL (ref 8.4–10.5)
Chloride: 107 mEq/L (ref 96–112)
Creatinine, Ser: 1.61 mg/dL — ABNORMAL HIGH (ref 0.40–1.50)
GFR: 49.19 mL/min — ABNORMAL LOW (ref >60.00)
Glucose, Bld: 100 mg/dL — ABNORMAL HIGH (ref 70–99)
Potassium: 4.2 mEq/L (ref 3.5–5.1)
Sodium: 139 mEq/L (ref 135–145)
Total Bilirubin: 0.5 mg/dL (ref 0.2–1.2)
Total Protein: 7.4 g/dL (ref 6.0–8.3)

## 2020-10-22 ENCOUNTER — Ambulatory Visit: Payer: No Typology Code available for payment source | Admitting: Physician Assistant

## 2020-10-22 ENCOUNTER — Encounter: Payer: Self-pay | Admitting: Physician Assistant

## 2020-10-22 VITALS — BP 124/94 | HR 80 | Ht 71.65 in | Wt 248.0 lb

## 2020-10-22 DIAGNOSIS — R1031 Right lower quadrant pain: Secondary | ICD-10-CM

## 2020-10-22 NOTE — Progress Notes (Signed)
Agree with assessment and plan as outlined.  

## 2020-10-22 NOTE — Patient Instructions (Signed)
You have been scheduled for a CT scan of the abdomen and pelvis at [facility] ([address]---this is in the same building as [employer]).   You are scheduled on Thursday 10/23/20 at 8:30 am. You should arrive 15 minutes prior to your appointment time for registration. Please follow the written instructions below on the day of your exam:  WARNING: IF YOU ARE ALLERGIC TO IODINE/X-RAY DYE, PLEASE NOTIFY RADIOLOGY IMMEDIATELY AT [phone]! YOU WILL BE GIVEN A 13 HOUR PREMEDICATION PREP.  1) Do not eat anything after 4:30 am (4 hours prior to your test) 2) You have been given 2 bottles of oral contrast to drink. The solution may taste better if refrigerated, but do NOT add ice or any other liquid to this solution. Shake well before drinking.    Drink 1 bottle of contrast @ 6:30 am (2 hours prior to your exam)  Drink 1 bottle of contrast @ 7:30 am (1 hour prior to your exam)  You may take any medications as prescribed with a small amount of water, if necessary. If you take any of the following medications: METFORMIN, GLUCOPHAGE, GLUCOVANCE, AVANDAMET, RIOMET, FORTAMET, [place] MET, JANUMET, GLUMETZA or METAGLIP, you MAY be asked to HOLD this medication 48 hours AFTER the exam.  The purpose of you drinking the oral contrast is to aid in the visualization of your intestinal tract. The contrast solution may cause some diarrhea. Depending on your individual set of symptoms, you may also receive an intravenous injection of x-ray contrast/dye. Plan on being at [facility] for 30 minutes or longer, depending on the type of exam you are having performed.  This test typically takes 30-45 minutes to complete.  If you have any questions regarding your exam or if you need to reschedule, you may call the CT department at [phone] between the hours of 8:00 am and 5:00 pm, Monday-Friday.  _____________________________________________________________

## 2020-10-22 NOTE — Progress Notes (Signed)
Chief Complaint: Right lower quadrant abdominal pain  HPI:    William Leon is a 50 year old African-American male, known to Dr. Havery Moros, with past medical history as listed below, who returns to clinic today for follow-up of his right lower quadrant abdominal pain.    05/03/2018 colonoscopy with 7 polyps which were all adenomatous.  Repeat recommended in 3 years as well as diverticulosis in the sigmoid colon and internal hemorrhoids.      05/15/2020 EGD for history of gastric ulcer causing GI bleed November 21 and treated H. pylori.  With 2 cm hiatal hernia and interval healing of prior gastric ulcer with some erythematous mucosa and slight nodularity at the site, benign-appearing.  Biopsies were negative for H. pylori.    Today, the patient tells me that about a month and a half ago he started with a right lower quadrant pain which is sharp in nature and seems to just come on "randomly", at first this was worse when he was walking or doing physical activity, now it seems more intermittent.  For example, he will notice it when his dog "jumps up on my stomach".  Describes it is typically a sharp pain which is instantaneous and then resolves.  It is hard for him to replicate.  He may or may not feel it when he is doing sit ups.  Denies any change with bowel habits.  No inciting incident.  Does tell me he lifts some heavy things at work.    Denies fever, chills, weight loss, blood in his stool, nausea, vomiting or symptoms that awaken him from sleep.  Past Medical History:  Diagnosis Date   Allergy    seasonal-ragweed   Gastric ulcer    H. pylori infection    Hyperlipidemia     Past Surgical History:  Procedure Laterality Date   ACHILLES TENDON SURGERY Right 01/2017   BIOPSY  02/19/2020   Procedure: BIOPSY;  Surgeon: Thornton Park, MD;  Location: [facility];  Service: Gastroenterology;;   ESOPHAGOGASTRODUODENOSCOPY (EGD) WITH PROPOFOL N/A 02/19/2020   Procedure: ESOPHAGOGASTRODUODENOSCOPY  (EGD) WITH PROPOFOL;  Surgeon: Thornton Park, MD;  Location: [facility];  Service: Gastroenterology;  Laterality: N/A;   NO PAST SURGERIES      Current Outpatient Medications  Medication Sig Dispense Refill   pantoprazole (PROTONIX) 20 MG tablet TAKE 1 TABLET BY MOUTH ONCE DAILY (Patient not taking: Reported on 09/24/2020) 28 tablet 0   rosuvastatin (CRESTOR) 10 MG tablet Take 1 tablet (10 mg total) by mouth daily. 90 tablet 1   No current facility-administered medications for this visit.    Allergies as of 10/22/2020   (No Known Allergies)    Family History  Problem Relation Age of Onset   Alcohol abuse Brother    Bowel Disease Brother    Colon cancer Neg Hx    Colon polyps Neg Hx    Esophageal cancer Neg Hx    Rectal cancer Neg Hx    Stomach cancer Neg Hx     Social History   Socioeconomic History   Marital status: Married    Spouse name: Not on file   Number of children: Not on file   Years of education: Not on file   Highest education level: Not on file  Occupational History   Not on file  Tobacco Use   Smoking status: Never   Smokeless tobacco: Never  Vaping Use   Vaping Use: Never used  Substance and Sexual Activity   Alcohol use: No  Alcohol/week: 0.0 standard drinks   Drug use: No   Sexual activity: Not on file  Other Topics Concern   Not on file  Social History Narrative   Not on file   Social Determinants of Health   Financial Resource Strain: Not on file  Food Insecurity: Not on file  Transportation Needs: Not on file  Physical Activity: Not on file  Stress: Not on file  Social Connections: Not on file  Intimate Partner Violence: Not on file    Review of Systems:    Constitutional: No weight loss, fever or chills Cardiovascular: No chest pain Respiratory: No SOB  Gastrointestinal: See HPI and otherwise negative   Physical Exam:  Vital signs: Ht 5' 11.65" (1.82 m) Comment: height measured without shoes  Wt 248 lb (112.5 kg)    BMI 33.96 kg/m   Constitutional:   Pleasant AA male appears to be in NAD, Well developed, Well nourished, alert and cooperative Respiratory: Respirations even and unlabored. Lungs clear to auscultation bilaterally.   No wheezes, crackles, or rhonchi.  Cardiovascular: Normal S1, S2. No MRG. Regular rate and rhythm. No peripheral edema, cyanosis or pallor.  Gastrointestinal:  Soft, nondistended, moderate RLQ ttp to deep palpation. No rebound or guarding. Normal bowel sounds. No appreciable masses or hepatomegaly. No palpable hernia Rectal:  Not performed.  Msk:  Symmetrical without gross deformities. Without edema, no deformity or joint abnormality. Psychiatric: Demonstrates good judgement and reason without abnormal affect or behaviors.  RELEVANT LABS AND IMAGING: CBC    Component Value Date/Time   WBC 7.6 10/21/2020 1024   RBC 5.81 10/21/2020 1024   HGB 15.8 10/21/2020 1024   HCT 47.0 10/21/2020 1024   PLT 162.0 10/21/2020 1024   MCV 80.8 10/21/2020 1024   MCH 30.1 02/20/2020 0426   MCHC 33.6 10/21/2020 1024   RDW 18.1 (H) 10/21/2020 1024   LYMPHSABS 2.2 10/21/2020 1024   MONOABS 0.6 10/21/2020 1024   EOSABS 0.3 10/21/2020 1024   BASOSABS 0.1 10/21/2020 1024    CMP     Component Value Date/Time   NA 139 10/21/2020 1024   K 4.2 10/21/2020 1024   CL 107 10/21/2020 1024   CO2 24 10/21/2020 1024   GLUCOSE 100 (H) 10/21/2020 1024   BUN 17 10/21/2020 1024   CREATININE 1.61 (H) 10/21/2020 1024   CREATININE 1.51 (H) 04/11/2018 1028   CALCIUM 9.8 10/21/2020 1024   PROT 7.4 10/21/2020 1024   ALBUMIN 4.2 10/21/2020 1024   AST 32 10/21/2020 1024   ALT 30 10/21/2020 1024   ALKPHOS 59 10/21/2020 1024   BILITOT 0.5 10/21/2020 1024   GFRNONAA 56 (L) 02/20/2020 0426    Assessment: 1.  Right lower quadrant pain: Possibly musculoskeletal versus ileocecal area?  Plan: 1.  Scheduled patient for CT of the abdomen pelvis with contrast for further evaluation. 2.  Patient to follow in  clinic per recommendations after imaging above.  William Newer, PA-C [facility] 10/22/2020, 9:37 AM  Cc: William Minium, MD

## 2020-10-23 ENCOUNTER — Other Ambulatory Visit: Payer: Self-pay

## 2020-10-23 ENCOUNTER — Ambulatory Visit (INDEPENDENT_AMBULATORY_CARE_PROVIDER_SITE_OTHER)
Admission: RE | Admit: 2020-10-23 | Discharge: 2020-10-23 | Disposition: A | Discharge status: Home / Self Care | Payer: No Typology Code available for payment source | Source: Ambulatory Visit | Attending: Physician Assistant | Admitting: Physician Assistant

## 2020-10-23 DIAGNOSIS — R1031 Right lower quadrant pain: Secondary | ICD-10-CM | POA: Diagnosis not present

## 2020-10-23 MED ORDER — IOHEXOL 300 MG/ML  SOLN
100.0000 mL | Freq: Once | INTRAMUSCULAR | Status: AC | PRN
  Administered 2020-10-23: 100 mL via INTRAVENOUS

## 2021-01-27 ENCOUNTER — Telehealth: Payer: Self-pay

## 2021-01-27 DIAGNOSIS — K869 Disease of pancreas, unspecified: Secondary | ICD-10-CM

## 2021-01-27 NOTE — Telephone Encounter (Signed)
Spoke with patient to remind him that he is due for repeat CT scan at this time. Pt has been scheduled for a CT scan of the abdomen at [facility] on Tuesday, 02/03/21 at 2 PM. Pt will need to arrive at 1:30 PM, NPO 4 hours prior. Pt will not drink contrast for pancreatic protocol, pt will drink water when he arrives at [facility]. Pt will come in for BMET tomorrow. Pt is aware that his CT scan appt information and instructions have been sent to him via my chart. Pt verbalized understanding and had no concerns at the end of the call.

## 2021-01-27 NOTE — Telephone Encounter (Signed)
-----   Message from Yevette Edwards, RN sent at 10/27/2020 11:56 AM EDT ----- Regarding: CT scan Repeat CT scan of the abdomen w/ contrast - fatty lesion on pancreas

## 2021-01-28 ENCOUNTER — Telehealth: Payer: Self-pay

## 2021-01-28 ENCOUNTER — Other Ambulatory Visit (INDEPENDENT_AMBULATORY_CARE_PROVIDER_SITE_OTHER): Payer: No Typology Code available for payment source

## 2021-01-28 DIAGNOSIS — K869 Disease of pancreas, unspecified: Secondary | ICD-10-CM

## 2021-01-28 LAB — BASIC METABOLIC PANEL
BUN: 16 mg/dL (ref 6–23)
CO2: 26 mEq/L (ref 19–32)
Calcium: 9.5 mg/dL (ref 8.4–10.5)
Chloride: 104 mEq/L (ref 96–112)
Creatinine, Ser: 1.57 mg/dL — ABNORMAL HIGH (ref 0.40–1.50)
GFR: 50.6 mL/min — ABNORMAL LOW (ref >60.00)
Glucose, Bld: 99 mg/dL (ref 70–99)
Potassium: 3.8 mEq/L (ref 3.5–5.1)
Sodium: 138 mEq/L (ref 135–145)

## 2021-01-28 NOTE — Telephone Encounter (Signed)
-----   Message from Yevette Edwards, RN sent at 01/27/2021  9:11 AM EDT ----- Regarding: Labs BMET today

## 2021-01-28 NOTE — Telephone Encounter (Signed)
Spoke with patient to remind him that he is due for labs prior to his CT scan. Patient is aware that he can stop by the lab in the basement today. He states that he is on the way to the lab now.

## 2021-02-03 ENCOUNTER — Ambulatory Visit (INDEPENDENT_AMBULATORY_CARE_PROVIDER_SITE_OTHER)
Admission: RE | Admit: 2021-02-03 | Discharge: 2021-02-03 | Disposition: A | Discharge status: Home / Self Care | Payer: No Typology Code available for payment source | Source: Ambulatory Visit | Attending: Physician Assistant | Admitting: Physician Assistant

## 2021-02-03 ENCOUNTER — Other Ambulatory Visit: Payer: Self-pay

## 2021-02-03 DIAGNOSIS — K869 Disease of pancreas, unspecified: Secondary | ICD-10-CM

## 2021-02-03 MED ORDER — IOHEXOL 350 MG/ML SOLN
100.0000 mL | Freq: Once | INTRAVENOUS | Status: AC | PRN
  Administered 2021-02-03: 100 mL via INTRAVENOUS

## 2021-03-12 ENCOUNTER — Ambulatory Visit (INDEPENDENT_AMBULATORY_CARE_PROVIDER_SITE_OTHER): Payer: No Typology Code available for payment source | Admitting: Family Medicine

## 2021-03-12 ENCOUNTER — Ambulatory Visit: Payer: No Typology Code available for payment source

## 2021-03-12 DIAGNOSIS — Z23 Encounter for immunization: Secondary | ICD-10-CM

## 2021-03-12 NOTE — Progress Notes (Signed)
Pt here today for flu shot, given in Lt Deltoid with no issues, no previous reaction to flu shot no concerns

## 2021-04-15 ENCOUNTER — Ambulatory Visit: Payer: PRIVATE HEALTH INSURANCE | Admitting: Family Medicine

## 2021-04-15 ENCOUNTER — Ambulatory Visit (INDEPENDENT_AMBULATORY_CARE_PROVIDER_SITE_OTHER): Payer: PRIVATE HEALTH INSURANCE

## 2021-04-15 ENCOUNTER — Ambulatory Visit: Payer: Self-pay

## 2021-04-15 ENCOUNTER — Other Ambulatory Visit (HOSPITAL_BASED_OUTPATIENT_CLINIC_OR_DEPARTMENT_OTHER): Payer: Self-pay

## 2021-04-15 ENCOUNTER — Encounter: Payer: Self-pay | Admitting: Family Medicine

## 2021-04-15 ENCOUNTER — Telehealth: Payer: Self-pay | Admitting: Family Medicine

## 2021-04-15 ENCOUNTER — Other Ambulatory Visit: Payer: Self-pay

## 2021-04-15 VITALS — BP 112/68 | HR 79 | Ht 71.65 in | Wt 254.8 lb

## 2021-04-15 DIAGNOSIS — M25562 Pain in left knee: Secondary | ICD-10-CM

## 2021-04-15 MED ORDER — HYDROCODONE-ACETAMINOPHEN 5-325 MG PO TABS
1.0000 | ORAL_TABLET | Freq: Four times a day (QID) | ORAL | 0 refills | Status: DC | PRN
  Filled 2021-04-15: qty 15, 2d supply, fill #0

## 2021-04-15 NOTE — Telephone Encounter (Signed)
Returned pt's call and he reports having increase pain and swelling in the knee. The call was transferred to Dr. Georgina Snell who prescribed him pain meds and advised to let us know if his pain was not improving over the next day or so.

## 2021-04-15 NOTE — Telephone Encounter (Signed)
Pt states he recd a steroid injection today during his OV. Asking about expectations for relief--should he feel any relief by now? He feels the same as before visit, possible knee is even "tighter". Please call pt to advise.

## 2021-04-15 NOTE — Patient Instructions (Addendum)
Nice to meet you.  You had a L knee injection.  Call or go to the ER if you develop a large red swollen joint with extreme pain or oozing puss.   Please get an Xray today before you leave.  Follow-up:as needed.

## 2021-04-15 NOTE — Addendum Note (Signed)
Addended by: Gregor Hams on: 04/15/2021 04:20 PM   Modules accepted: Orders

## 2021-04-15 NOTE — Progress Notes (Addendum)
Subjective:    CC: L knee pain  I, Molly Weber, LAT, ATC, am serving as scribe for Dr. Lynne Leader.  HPI: Pt is a 51 y/o male presenting w/ c/o L knee pain x one week w/ no known MOI.  He locates his pain to his L medial knee.  L knee swelling: no L knee mechanical symptoms: no Aggravating factors: sitting w/ his L knee flexed; transitioning from sitting to standing; first few steps of walking Treatments tried: topical pain-relieving cream  Pertinent review of Systems: No fevers or chills  Relevant historical information: Gastric ulcer   Objective:    Vitals:   04/15/21 1014  BP: 112/68  Pulse: 79  SpO2: 95%   General: Well Developed, well nourished, and in no acute distress.   MSK: Left knee normal-appearing Tender palpation medial joint line. Normal knee motion. Stable ligamentous exam. Positive medial McMurray's test. Intact strength.  Lab and Radiology Results  Procedure: Real-time Ultrasound Guided Injection of left knee superior lateral patellar space Device: Philips Affiniti 50G Images permanently stored and available for review in PACS Ultrasound evaluation prior to injection reveals a small joint effusion and degenerative appearing medial meniscus with a visible split tear. Verbal informed consent obtained.  Discussed risks and benefits of procedure. Warned about infection bleeding damage to structures skin hypopigmentation and fat atrophy among others. Patient expresses understanding and agreement Time-out conducted.   Noted no overlying erythema, induration, or other signs of local infection.   Skin prepped in a sterile fashion.   Local anesthesia: Topical Ethyl chloride.   With sterile technique and under real time ultrasound guidance: 40 mg of Kenalog and 2 mL of Marcaine injected into knee joint. Fluid seen entering the joint capsule.   Completed without difficulty   Pain immediately resolved suggesting accurate placement of the medication.    Advised to call if fevers/chills, erythema, induration, drainage, or persistent bleeding.   Images permanently stored and available for review in the ultrasound unit.  Impression: Technically successful ultrasound guided injection.   X-ray images left knee obtained today personally and independently interpreted Mild medial compartment DJD.  No acute fractures. Await formal radiology review    Impression and Recommendations:    Assessment and Plan: 51 y.o. male with left knee pain thought to be due to medial meniscus tear.  Patient does not have severe mechanical symptoms.  Plan for steroid injection today and watchful waiting.  If not improved consider MRI.Marland Kitchen  Chanel called back. He is feeling worse with severe pain.  I suspect steroid flair. Rx norco. Update me tomorrow.   PDMP not reviewed this encounter. Orders Placed This Encounter  Procedures   [place] LIMITED JOINT SPACE STRUCTURES LOW LEFT(NO LINKED CHARGES)    Order Specific Question:   Reason for Exam (SYMPTOM  OR DIAGNOSIS REQUIRED)    Answer:   L knee pain    Order Specific Question:   Preferred imaging location?    Answer:   [facility]   DG Knee AP/LAT W/[place] Left    Standing Status:   Future    Number of Occurrences:   1    Standing Expiration Date:   05/16/2021    Order Specific Question:   Reason for Exam (SYMPTOM  OR DIAGNOSIS REQUIRED)    Answer:   L knee pain    Order Specific Question:   Preferred imaging location?    Answer:   [facility]   No orders of the defined types  were placed in this encounter.   Discussed warning signs or symptoms. Please see discharge instructions. Patient expresses understanding.   The above documentation has been reviewed and is accurate and complete Lynne Leader, M.D.

## 2021-04-15 NOTE — Telephone Encounter (Signed)
Spoke w/ pt. See phone note.

## 2021-04-16 NOTE — Progress Notes (Signed)
Left knee x-ray shows a little bit of arthritis in the knee.

## 2021-04-16 NOTE — Telephone Encounter (Signed)
I did speak with patient and prescribed medication.  This conversation was documented in the office visit dated 04/15/2020. Diagnosis suspected to be steroid flare.  Advised him that it should improve in a few days.

## 2021-04-17 ENCOUNTER — Ambulatory Visit: Payer: No Typology Code available for payment source | Admitting: Registered Nurse

## 2021-04-20 ENCOUNTER — Other Ambulatory Visit: Payer: Self-pay

## 2021-04-20 ENCOUNTER — Telehealth: Payer: Self-pay

## 2021-04-20 DIAGNOSIS — M25562 Pain in left knee: Secondary | ICD-10-CM

## 2021-04-20 NOTE — Telephone Encounter (Signed)
Pt called the office and expressed concern about his XR results. Pt stated that his knee pain hasn't improved significantly enough and he would like to proceed to MRI to really figure out the root cause of his knee pain. [facility] order placed to [facility].

## 2021-04-23 ENCOUNTER — Telehealth: Payer: Self-pay | Admitting: Family Medicine

## 2021-04-23 ENCOUNTER — Other Ambulatory Visit (HOSPITAL_BASED_OUTPATIENT_CLINIC_OR_DEPARTMENT_OTHER): Payer: Self-pay

## 2021-04-23 MED ORDER — DICLOFENAC SODIUM 75 MG PO TBEC
75.0000 mg | DELAYED_RELEASE_TABLET | Freq: Two times a day (BID) | ORAL | 0 refills | Status: DC | PRN
  Filled 2021-04-23: qty 30, 15d supply, fill #0

## 2021-04-23 NOTE — Telephone Encounter (Signed)
Take hydrocodone with diclofenac.  Diclofenac prescribed to the outpatient pharmacy.

## 2021-04-23 NOTE — Telephone Encounter (Signed)
Called pt to advise him of the new diclofenac medication.  He states that he only has a few hydrocodone left and would also like a refill on that medication.  Pt states that he still has pain w/ the hydrocodone and is curious if the hydrocodone dosage can be increased.  Please advise and refill hydrocodone at appropriate dosage.

## 2021-04-23 NOTE — Telephone Encounter (Signed)
Patient called stating that he is still having knee pain. He said that it is right behind his knee cap. Is there anything else he can try or can he increase his medication? His MRI is scheduled for Saturday.

## 2021-04-24 ENCOUNTER — Other Ambulatory Visit (HOSPITAL_BASED_OUTPATIENT_CLINIC_OR_DEPARTMENT_OTHER): Payer: Self-pay

## 2021-04-24 MED ORDER — HYDROCODONE-ACETAMINOPHEN 5-325 MG PO TABS
1.0000 | ORAL_TABLET | Freq: Four times a day (QID) | ORAL | 0 refills | Status: DC | PRN
  Filled 2021-04-24: qty 15, 2d supply, fill #0

## 2021-04-24 NOTE — Telephone Encounter (Signed)
I have refilled the hydrocodone at the same dose.  I think we need to figure out what is wrong before we can adjust narcotic pain medications.

## 2021-04-25 ENCOUNTER — Other Ambulatory Visit: Payer: Self-pay

## 2021-04-25 ENCOUNTER — Ambulatory Visit (INDEPENDENT_AMBULATORY_CARE_PROVIDER_SITE_OTHER): Payer: PRIVATE HEALTH INSURANCE

## 2021-04-25 DIAGNOSIS — M25562 Pain in left knee: Secondary | ICD-10-CM | POA: Diagnosis not present

## 2021-04-27 ENCOUNTER — Telehealth: Payer: Self-pay | Admitting: Family Medicine

## 2021-04-27 DIAGNOSIS — M25562 Pain in left knee: Secondary | ICD-10-CM

## 2021-04-27 NOTE — Progress Notes (Signed)
MRI shows a meniscus tear but it also shows some arthritis worse at the medial compartment that is rated as medium to advanced.  The arthritis is probably the source of your pain.  I think the meniscus tear has allow the arthritis to feel a lot worse. Unfortunately your pain is so out of control that I am not confident that conservative management will work.  Surgery may be indicated here.  I will refer you directly to orthopedic surgery to discuss surgical options.

## 2021-04-27 NOTE — Telephone Encounter (Signed)
Referral placed to orthopedic surgery

## 2021-04-29 ENCOUNTER — Encounter: Payer: Self-pay | Admitting: Family Medicine

## 2021-04-29 ENCOUNTER — Other Ambulatory Visit (HOSPITAL_BASED_OUTPATIENT_CLINIC_OR_DEPARTMENT_OTHER): Payer: Self-pay

## 2021-04-29 ENCOUNTER — Ambulatory Visit: Payer: PRIVATE HEALTH INSURANCE | Admitting: Family Medicine

## 2021-04-29 VITALS — BP 132/76 | HR 81 | Temp 98.4°F | Resp 16 | Wt 255.0 lb

## 2021-04-29 DIAGNOSIS — B029 Zoster without complications: Secondary | ICD-10-CM

## 2021-04-29 DIAGNOSIS — M25562 Pain in left knee: Secondary | ICD-10-CM

## 2021-04-29 DIAGNOSIS — K250 Acute gastric ulcer with hemorrhage: Secondary | ICD-10-CM

## 2021-04-29 DIAGNOSIS — E785 Hyperlipidemia, unspecified: Secondary | ICD-10-CM | POA: Diagnosis not present

## 2021-04-29 LAB — HEPATIC FUNCTION PANEL
ALT: 51 U/L (ref 0–53)
AST: 40 U/L — ABNORMAL HIGH (ref 0–37)
Albumin: 4.1 g/dL (ref 3.5–5.2)
Alkaline Phosphatase: 54 U/L (ref 39–117)
Bilirubin, Direct: 0.1 mg/dL (ref 0.0–0.3)
Total Bilirubin: 0.6 mg/dL (ref 0.2–1.2)
Total Protein: 7 g/dL (ref 6.0–8.3)

## 2021-04-29 LAB — CBC WITH DIFFERENTIAL/PLATELET
Basophils Absolute: 0.1 10*3/uL (ref 0.0–0.1)
Basophils Relative: 0.6 % (ref 0.0–3.0)
Eosinophils Absolute: 0.2 10*3/uL (ref 0.0–0.7)
Eosinophils Relative: 2.1 % (ref 0.0–5.0)
HCT: 50.8 % (ref 39.0–52.0)
Hemoglobin: 16.7 g/dL (ref 13.0–17.0)
Lymphocytes Relative: 22.4 % (ref 12.0–46.0)
Lymphs Abs: 1.8 10*3/uL (ref 0.7–4.0)
MCHC: 32.9 g/dL (ref 30.0–36.0)
MCV: 88.1 fl (ref 78.0–100.0)
Monocytes Absolute: 0.6 10*3/uL (ref 0.1–1.0)
Monocytes Relative: 7 % (ref 3.0–12.0)
Neutro Abs: 5.5 10*3/uL (ref 1.4–7.7)
Neutrophils Relative %: 67.9 % (ref 43.0–77.0)
Platelets: 165 10*3/uL (ref 150.0–400.0)
RBC: 5.77 Mil/uL (ref 4.22–5.81)
RDW: 14.5 % (ref 11.5–15.5)
WBC: 8.1 10*3/uL (ref 4.0–10.5)

## 2021-04-29 LAB — LIPID PANEL
Cholesterol: 226 mg/dL — ABNORMAL HIGH (ref 0–200)
HDL: 33.4 mg/dL — ABNORMAL LOW (ref >39.00)
NonHDL: 193.09
Total CHOL/HDL Ratio: 7
Triglycerides: 270 mg/dL — ABNORMAL HIGH (ref 0.0–149.0)
VLDL: 54 mg/dL — ABNORMAL HIGH (ref 0.0–40.0)

## 2021-04-29 LAB — BASIC METABOLIC PANEL
BUN: 21 mg/dL (ref 6–23)
CO2: 27 mEq/L (ref 19–32)
Calcium: 9.4 mg/dL (ref 8.4–10.5)
Chloride: 104 mEq/L (ref 96–112)
Creatinine, Ser: 1.56 mg/dL — ABNORMAL HIGH (ref 0.40–1.50)
GFR: 50.9 mL/min — ABNORMAL LOW (ref >60.00)
Glucose, Bld: 82 mg/dL (ref 70–99)
Potassium: 4.1 mEq/L (ref 3.5–5.1)
Sodium: 139 mEq/L (ref 135–145)

## 2021-04-29 LAB — TSH: TSH: 1.72 u[IU]/mL (ref 0.35–5.50)

## 2021-04-29 LAB — LDL CHOLESTEROL, DIRECT: Direct LDL: 159 mg/dL

## 2021-04-29 MED ORDER — TRIAMCINOLONE ACETONIDE 0.1 % EX OINT
1.0000 "application " | TOPICAL_OINTMENT | Freq: Two times a day (BID) | CUTANEOUS | 1 refills | Status: DC
  Filled 2021-04-29: qty 80, 40d supply, fill #0

## 2021-04-29 MED ORDER — VALACYCLOVIR HCL 1 G PO TABS
1000.0000 mg | ORAL_TABLET | Freq: Three times a day (TID) | ORAL | 0 refills | Status: AC
  Filled 2021-04-29: qty 21, 7d supply, fill #0

## 2021-04-29 NOTE — Patient Instructions (Signed)
Follow up as needed or as scheduled The rash appears to be shingles START the Valacyclovir (Valtrex) 3x/day as directed to help shorten the severity and duration of shingles USE the Triamcinolone ointment on any itchy or burning areas STOP the Diclofenac (Voltaren) pills b/c this is what caused your stomach ulcer last time USE the Voltaren gel up to 4x/day I agree w/ an orthopedic referral Call with any questions or concerns Hang in there!

## 2021-04-29 NOTE — Progress Notes (Signed)
° °  Subjective:    Patient ID: William Leon, male    DOB: Aug 01, 1970, 51 y.o.   MRN: [id number]  HPI Rash- pt noted while he was showering.  R shoulder.  First noticed ~4 days ago.  Areas appeared to have white heads.  Not painful or itchy.  Yesterday developed similar lesions on R forearm.    L knee pain- pt saw provider at work and was given a script for Hydrocodone.  At 2nd visit was given anti-inflammatory- Diclofenac.  Had knee injxn w/ some relief.  Had MRI that showed irregular radial tear of medial meniscus and tricompartmental osteoarthritis.  Has now been referred to Ortho which upsets him b/c he feels his concerns haven't been addressed.  Has hx of gastric ulcer w/ hemorrhage.  Hyperlipidemia- pt has not had recent labs and admits to not taking his Crestor regularly.  Denies CP, SOB, abd pain, N/V.   Review of Systems For ROS see HPI   This visit occurred during the SARS-CoV-2 public health emergency.  Safety protocols were in place, including screening questions prior to the visit, additional usage of staff PPE, and extensive cleaning of exam room while observing appropriate contact time as indicated for disinfecting solutions.      Objective:   Physical Exam Vitals reviewed.  Constitutional:      General: He is not in acute distress.    Appearance: Normal appearance. He is not ill-appearing.  HENT:     Head: Normocephalic and atraumatic.  Eyes:     Extraocular Movements: Extraocular movements intact.     Conjunctiva/sclera: Conjunctivae normal.     Pupils: Pupils are equal, round, and reactive to light.  Musculoskeletal:     Right lower leg: No edema.     Left lower leg: No edema.  Skin:    General: Skin is warm and dry.     Findings: Rash (pt w/ small pustules/vesicles on R shoulder and erythematous, itchy emerging area over proximal radius) present.  Neurological:     General: No focal deficit present.     Mental Status: He is alert and oriented to person, place,  and time.  Psychiatric:        Mood and Affect: Mood normal.        Behavior: Behavior normal.          Assessment & Plan:   Shingles- new.  Pt has area on R shoulder that appears to be excoriated pustules/vesicles and an emerging area near R elbow.  The area near the elbow is itchy and does irritate him.  Difficult to say for certain, but will treat as shingles w/ Valtrex.  Triamcinolone ointment as needed for itching.  Pt expressed understanding and is in agreement w/ plan.   L knee pain- reviewed office notes and MRI.  Agree w/ Ortho referral and explained this to pt.  Will stop oral NSAIDs and switch to topical.  Pt expressed understanding and is in agreement w/ plan.

## 2021-04-29 NOTE — Assessment & Plan Note (Signed)
Chronic problem.  Pt admits he has not been taking his statin regularly.  Check labs and restart as needed.

## 2021-04-29 NOTE — Assessment & Plan Note (Signed)
Pt has hx of GI bleed due to NSAID use.  Encouraged him to stop oral Diclofenac and switch to topical gel.  Pt expressed understanding and is in agreement w/ plan.

## 2021-04-30 ENCOUNTER — Other Ambulatory Visit: Payer: Self-pay | Admitting: Family Medicine

## 2021-04-30 MED ORDER — ROSUVASTATIN CALCIUM 10 MG PO TABS
10.0000 mg | ORAL_TABLET | Freq: Every day | ORAL | 1 refills | Status: DC
  Filled 2021-04-30: qty 90, 90d supply, fill #0
  Filled 2022-04-15: qty 90, 90d supply, fill #1

## 2021-05-01 ENCOUNTER — Other Ambulatory Visit (HOSPITAL_BASED_OUTPATIENT_CLINIC_OR_DEPARTMENT_OTHER): Payer: Self-pay

## 2021-05-05 ENCOUNTER — Ambulatory Visit: Payer: PRIVATE HEALTH INSURANCE | Admitting: Orthopaedic Surgery

## 2021-05-05 ENCOUNTER — Encounter: Payer: Self-pay | Admitting: Orthopaedic Surgery

## 2021-05-05 ENCOUNTER — Other Ambulatory Visit: Payer: Self-pay

## 2021-05-05 ENCOUNTER — Other Ambulatory Visit (HOSPITAL_BASED_OUTPATIENT_CLINIC_OR_DEPARTMENT_OTHER): Payer: Self-pay

## 2021-05-05 VITALS — Ht 71.0 in | Wt 244.0 lb

## 2021-05-05 DIAGNOSIS — S83242A Other tear of medial meniscus, current injury, left knee, initial encounter: Secondary | ICD-10-CM | POA: Diagnosis not present

## 2021-05-05 MED ORDER — TRAMADOL HCL 50 MG PO TABS
50.0000 mg | ORAL_TABLET | Freq: Every day | ORAL | 0 refills | Status: DC | PRN
  Filled 2021-05-05: qty 20, 20d supply, fill #0

## 2021-05-05 NOTE — Progress Notes (Signed)
Office Visit Note   Patient: William Leon           Date of Birth: June 22, 1970           MRN: [id number] Visit Date: 05/05/2021              Requested by: Gregor Hams, MD [place],  [place] [address] PCP: Midge Minium, MD   Assessment & Plan: Visit Diagnoses:  1. Acute medial meniscus tear of left knee, initial encounter     Plan: Impression is symptomatic left medial meniscus tear.  MRI findings and images reviewed with the patient in detail.  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  I recommended arthroscopic partial medial meniscectomy.  Risk benefits prognosis rehab recovery all reviewed with the patient detail.  Tramadol refilled today.  We will look to schedule him for early February since he did get an injection about 3 weeks ago.  Follow-Up Instructions: No follow-ups on file.   Orders:  No orders of the defined types were placed in this encounter.  Meds ordered this encounter  Medications   traMADol (ULTRAM) 50 MG tablet    Sig: Take 1 tablet (50 mg total) by mouth daily as needed.    Dispense:  20 tablet    Refill:  0      Procedures: No procedures performed   Clinical Data: No additional findings.   Subjective: Chief Complaint  Patient presents with   Left Knee - Pain    William Leon is a 51 year old gentleman referral from Dr. Georgina Snell for left knee medial meniscus tear.  He had an injury about 3 weeks ago in which he tripped over some luggage.  He had a sudden onset of medial sided left knee pain.  He had ultrasound MRI that subsequently showed a complex tear of the medial meniscus.  He is limping quite a bit.  Unable to take NSAIDs due to history of stomach ulcers.  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  He also endorses swelling to the left knee as well as mechanical symptoms.   Review of  Systems  Constitutional: Negative.   All other systems reviewed and are negative.   Objective: Vital Signs: Ht 5\' 11"  (1.803 m)    Wt 244 lb (110.7 kg)    BMI 34.03 kg/m   Physical Exam Vitals and nursing note reviewed.  Constitutional:      Appearance: He is well-developed.  Pulmonary:     Effort: Pulmonary effort is normal.  Abdominal:     Palpations: Abdomen is soft.  Skin:    General: Skin is warm.  Neurological:     Mental Status: He is alert and oriented to person, place, and time.  Psychiatric:        Behavior: Behavior normal.        Thought Content: Thought content normal.        Judgment: Judgment normal.    Ortho Exam  Examination left knee shows trace effusion.  He has exquisite medial joint line tenderness with positive McMurray's sign.  Collaterals and cruciates are stable.  He has increased pain to the medial side of the knee with a range of motion past 70 degrees.  Specialty Comments:  No specialty comments available.  Imaging: No results found.   PMFS History: Patient Active  Problem List   Diagnosis Date Noted   H. pylori duodenitis 02/27/2020   Hypocalcemia 02/19/2020   Anemia    Gastric ulcer with hemorrhage    Melena 02/18/2020   Hyperlipidemia    Azotemia    Leukocytosis    Obesity (BMI 30-39.9) 04/11/2019   Plantar fasciitis 10/31/2017   Achilles tendon rupture, right, subsequent encounter 02/07/2017   Penile trauma 06/18/2014   Elevated serum creatinine 05/22/2014   Physical exam, annual 11/04/2011   TESTICULAR MASS, LEFT 10/22/2009   LENTIGO 10/01/2009   Past Medical History:  Diagnosis Date   Allergy    seasonal-ragweed   Gastric ulcer    H. pylori infection    Hyperlipidemia     Family History  Problem Relation Age of Onset   Alcohol abuse Brother    Bowel Disease Brother    Colon cancer Neg Hx    Colon polyps Neg Hx    Esophageal cancer Neg Hx    Rectal cancer Neg Hx    Stomach cancer Neg Hx     Past Surgical  History:  Procedure Laterality Date   ACHILLES TENDON SURGERY Right 01/2017   BIOPSY  02/19/2020   Procedure: BIOPSY;  Surgeon: Thornton Park, MD;  Location: [facility];  Service: Gastroenterology;;   ESOPHAGOGASTRODUODENOSCOPY (EGD) WITH PROPOFOL N/A 02/19/2020   Procedure: ESOPHAGOGASTRODUODENOSCOPY (EGD) WITH PROPOFOL;  Surgeon: Thornton Park, MD;  Location: [facility];  Service: Gastroenterology;  Laterality: N/A;   NO PAST SURGERIES     Social History   Occupational History   Not on file  Tobacco Use   Smoking status: Never   Smokeless tobacco: Never  Vaping Use   Vaping Use: Never used  Substance and Sexual Activity   Alcohol use: No    Alcohol/week: 0.0 standard drinks   Drug use: No   Sexual activity: Not on file

## 2021-05-11 ENCOUNTER — Other Ambulatory Visit: Payer: Self-pay | Admitting: Orthopaedic Surgery

## 2021-05-11 ENCOUNTER — Other Ambulatory Visit (HOSPITAL_BASED_OUTPATIENT_CLINIC_OR_DEPARTMENT_OTHER): Payer: Self-pay

## 2021-05-11 ENCOUNTER — Telehealth: Payer: Self-pay

## 2021-05-11 ENCOUNTER — Other Ambulatory Visit: Payer: Self-pay | Admitting: Family Medicine

## 2021-05-11 ENCOUNTER — Telehealth: Payer: Self-pay | Admitting: Family Medicine

## 2021-05-11 MED ORDER — TRAMADOL HCL 50 MG PO TABS
50.0000 mg | ORAL_TABLET | Freq: Every day | ORAL | 0 refills | Status: DC | PRN
  Filled 2021-05-11: qty 20, 20d supply, fill #0

## 2021-05-11 NOTE — Telephone Encounter (Signed)
He told the pharmacy not to fill the Tramadol (it was sent in by another prescriber).

## 2021-05-11 NOTE — Telephone Encounter (Signed)
Called pt and left a VM informing him Dr. Georgina Snell refused to refill the hydrocodone.

## 2021-05-11 NOTE — Telephone Encounter (Signed)
Pt calling for refill on hydrocodone. He contacted the pharmacy but seems to be a little confused so I advised I would sent a msg.

## 2021-05-11 NOTE — Telephone Encounter (Signed)
Left pt a VM informing him Dr. Georgina Snell denied the refill of his hydrocodone.

## 2021-05-25 ENCOUNTER — Other Ambulatory Visit: Payer: Self-pay | Admitting: Physician Assistant

## 2021-05-25 ENCOUNTER — Other Ambulatory Visit (HOSPITAL_BASED_OUTPATIENT_CLINIC_OR_DEPARTMENT_OTHER): Payer: Self-pay

## 2021-05-25 MED ORDER — HYDROCODONE-ACETAMINOPHEN 5-325 MG PO TABS
1.0000 | ORAL_TABLET | Freq: Three times a day (TID) | ORAL | 0 refills | Status: DC | PRN
  Filled 2021-05-25: qty 30, 5d supply, fill #0

## 2021-05-25 MED ORDER — ONDANSETRON HCL 4 MG PO TABS
4.0000 mg | ORAL_TABLET | Freq: Three times a day (TID) | ORAL | 0 refills | Status: DC | PRN
  Filled 2021-05-25: qty 40, 14d supply, fill #0

## 2021-05-27 ENCOUNTER — Other Ambulatory Visit: Payer: Self-pay | Admitting: Physician Assistant

## 2021-05-27 ENCOUNTER — Ambulatory Visit (INDEPENDENT_AMBULATORY_CARE_PROVIDER_SITE_OTHER): Payer: PRIVATE HEALTH INSURANCE | Admitting: Family Medicine

## 2021-05-27 ENCOUNTER — Encounter: Payer: Self-pay | Admitting: Family Medicine

## 2021-05-27 VITALS — BP 126/74 | HR 77 | Temp 98.3°F | Resp 16 | Ht 71.0 in | Wt 257.0 lb

## 2021-05-27 DIAGNOSIS — Z125 Encounter for screening for malignant neoplasm of prostate: Secondary | ICD-10-CM

## 2021-05-27 DIAGNOSIS — Z1159 Encounter for screening for other viral diseases: Secondary | ICD-10-CM

## 2021-05-27 DIAGNOSIS — Z0000 Encounter for general adult medical examination without abnormal findings: Secondary | ICD-10-CM | POA: Diagnosis not present

## 2021-05-27 DIAGNOSIS — E669 Obesity, unspecified: Secondary | ICD-10-CM

## 2021-05-27 LAB — PSA: PSA: 0.22 ng/mL (ref 0.10–4.00)

## 2021-05-27 NOTE — Progress Notes (Signed)
° °  Subjective:    Patient ID: William Leon, male    DOB: [birth year], 51 y.o.   MRN: [id number]  HPI CPE- UTD on colonoscopy, Tdap, flu  Health Maintenance  Topic Date Due   Hepatitis C Screening  Never done   Zoster Vaccines- Shingrix (1 of 2) Never done   COVID-19 Vaccine (3 - Booster for Moderna series) 05/05/2020   TETANUS/TDAP  11/03/2021   COLONOSCOPY (Pts 45-41yrs Insurance coverage will need to be confirmed)  05/03/2022   INFLUENZA VACCINE  Completed   HIV Screening  Completed   HPV VACCINES  Aged Out      Review of Systems Patient reports no vision/hearing changes, anorexia, fever ,adenopathy, persistant/recurrent hoarseness, swallowing issues, chest pain, palpitations, edema, persistant/recurrent cough, hemoptysis, dyspnea (rest,exertional, paroxysmal nocturnal), gastrointestinal  bleeding (melena, rectal bleeding), abdominal pain, excessive heart burn, GU symptoms (dysuria, hematuria, voiding/incontinence issues) syncope, focal weakness, memory loss, numbness & tingling, skin/hair/nail changes, depression, anxiety, abnormal bruising/bleeding.   + R knee pain- surgery tomorrow  This visit occurred during the SARS-CoV-2 public health emergency.  Safety protocols were in place, including screening questions prior to the visit, additional usage of staff PPE, and extensive cleaning of exam room while observing appropriate contact time as indicated for disinfecting solutions.      Objective:   Physical Exam General Appearance:    Alert, cooperative, no distress, appears stated age  Head:    Normocephalic, without obvious abnormality, atraumatic  Eyes:    PERRL, conjunctiva/corneas clear, EOM's intact, fundi    benign, both eyes       Ears:    Normal TM's and external ear canals, both ears  Nose:   Deferred due to COVID  Throat:   Neck:   Supple, symmetrical, trachea midline, no adenopathy;       thyroid:  No enlargement/tenderness/nodules  Back:     Symmetric, no curvature,  ROM normal, no CVA tenderness  Lungs:     Clear to auscultation bilaterally, respirations unlabored  Chest wall:    No tenderness or deformity  Heart:    Regular rate and rhythm, S1 and S2 normal, no murmur, rub   or gallop  Abdomen:     Soft, non-tender, bowel sounds active all four quadrants,    no masses, no organomegaly  Genitalia:    Deferred  Rectal:    Extremities:   Extremities normal, atraumatic, no cyanosis or edema  Pulses:   2+ and symmetric all extremities  Skin:   Skin color, texture, turgor normal, no rashes or lesions  Lymph nodes:   Cervical, supraclavicular, and axillary nodes normal  Neurologic:   CNII-XII intact. Normal strength, sensation and reflexes      throughout          Assessment & Plan:

## 2021-05-27 NOTE — Assessment & Plan Note (Addendum)
Pt's PE WNL w/ exception of obesity.  UTD on colonoscopy, Tdap, flu.  Reviewed recent labs w/ pt.  Will get PSA and Hep C screen.  Anticipatory guidance provided.

## 2021-05-27 NOTE — Assessment & Plan Note (Signed)
Ongoing issue for pt.  BMI is somewhat misleading due to muscle mass.  Encouraged healthy diet and exercise as able (he has knee surgery tomorrow).  Will follow.

## 2021-05-27 NOTE — Patient Instructions (Signed)
Follow up in 6 months to recheck cholesterol We'll notify you of your lab results and make any changes if needed Continue to work on healthy diet and exercise as you are able Call with any questions or concerns GOOD LUCK TOMORROW!!!

## 2021-05-28 ENCOUNTER — Encounter: Payer: Self-pay | Admitting: Orthopaedic Surgery

## 2021-05-28 DIAGNOSIS — S83242A Other tear of medial meniscus, current injury, left knee, initial encounter: Secondary | ICD-10-CM

## 2021-05-28 DIAGNOSIS — M659 Synovitis and tenosynovitis, unspecified: Secondary | ICD-10-CM

## 2021-05-28 LAB — HEPATITIS C ANTIBODY
Hepatitis C Ab: NONREACTIVE
SIGNAL TO CUT-OFF: 0.06 (ref <1.00)

## 2021-06-04 ENCOUNTER — Ambulatory Visit (INDEPENDENT_AMBULATORY_CARE_PROVIDER_SITE_OTHER): Payer: PRIVATE HEALTH INSURANCE | Admitting: Physician Assistant

## 2021-06-04 ENCOUNTER — Encounter: Payer: Self-pay | Admitting: Orthopaedic Surgery

## 2021-06-04 ENCOUNTER — Other Ambulatory Visit: Payer: Self-pay

## 2021-06-04 DIAGNOSIS — Z9889 Other specified postprocedural states: Secondary | ICD-10-CM

## 2021-06-04 NOTE — Progress Notes (Signed)
° °  Post-Op Visit Note   Patient: William Leon           Date of Birth: [birth year]           MRN: [id number] Visit Date: 06/04/2021 PCP: Midge Minium, MD   Assessment & Plan:  Chief Complaint:  Chief Complaint  Patient presents with   Left Knee - Pain   Visit Diagnoses:  1. S/P left knee arthroscopy     Plan: Patient is to follow-up in 51-year-old gentleman comes in 7 weeks status post left knee arthroscopic debridement medial meniscus and chondroplasty 05/28/2021.  He is doing well.  He is taking Tylenol and hydrocodone as needed.  He denies any calf pain.  Emanation of left knee reveals well-healed surgical portals without complication.  Calf soft nontender.  He is neurovascular intact distally.  Today, sutures were removed and Steri-Strips applied.  Intraoperative pictures reviewed.  Home exercise handout provided.  He will continue to advance with activity as tolerated.  Follow-up with [place] in 5 weeks time for recheck.  Call with concerns or questions.  Follow-Up Instructions: Return in about 5 weeks (around 07/09/2021).   Orders:  No orders of the defined types were placed in this encounter.  No orders of the defined types were placed in this encounter.   Imaging: No new imaging  PMFS History: Patient Active Problem List   Diagnosis Date Noted   H. pylori duodenitis 02/27/2020   Hypocalcemia 02/19/2020   Anemia    Gastric ulcer with hemorrhage    Melena 02/18/2020   Hyperlipidemia    Azotemia    Leukocytosis    Obesity (BMI 30-39.9) 04/11/2019   Plantar fasciitis 10/31/2017   Achilles tendon rupture, right, subsequent encounter 02/07/2017   Penile trauma 06/18/2014   Elevated serum creatinine 05/22/2014   Physical exam, annual 11/04/2011   TESTICULAR MASS, LEFT 10/22/2009   LENTIGO 10/01/2009   Past Medical History:  Diagnosis Date   Allergy    seasonal-ragweed   Gastric ulcer    H. pylori infection    Hyperlipidemia     Family History  Problem  Relation Age of Onset   Alcohol abuse Brother    Bowel Disease Brother    Colon cancer Neg Hx    Colon polyps Neg Hx    Esophageal cancer Neg Hx    Rectal cancer Neg Hx    Stomach cancer Neg Hx     Past Surgical History:  Procedure Laterality Date   ACHILLES TENDON SURGERY Right 01/2017   BIOPSY  02/19/2020   Procedure: BIOPSY;  Surgeon: Thornton Park, MD;  Location: [facility];  Service: Gastroenterology;;   ESOPHAGOGASTRODUODENOSCOPY (EGD) WITH PROPOFOL N/A 02/19/2020   Procedure: ESOPHAGOGASTRODUODENOSCOPY (EGD) WITH PROPOFOL;  Surgeon: Thornton Park, MD;  Location: [facility];  Service: Gastroenterology;  Laterality: N/A;   NO PAST SURGERIES     Social History   Occupational History   Not on file  Tobacco Use   Smoking status: Never   Smokeless tobacco: Never  Vaping Use   Vaping Use: Never used  Substance and Sexual Activity   Alcohol use: No    Alcohol/week: 0.0 standard drinks   Drug use: No   Sexual activity: Not on file

## 2021-07-09 ENCOUNTER — Encounter: Payer: Self-pay | Admitting: Orthopaedic Surgery

## 2021-07-09 ENCOUNTER — Ambulatory Visit (INDEPENDENT_AMBULATORY_CARE_PROVIDER_SITE_OTHER): Payer: PRIVATE HEALTH INSURANCE | Admitting: Orthopaedic Surgery

## 2021-07-09 DIAGNOSIS — Z9889 Other specified postprocedural states: Secondary | ICD-10-CM

## 2021-07-09 DIAGNOSIS — S83242A Other tear of medial meniscus, current injury, left knee, initial encounter: Secondary | ICD-10-CM

## 2021-07-09 MED ORDER — METHYLPREDNISOLONE ACETATE 40 MG/ML IJ SUSP
40.0000 mg | INTRAMUSCULAR | Status: AC | PRN
  Administered 2021-07-09: 40 mg via INTRA_ARTICULAR

## 2021-07-09 MED ORDER — BUPIVACAINE HCL 0.5 % IJ SOLN
2.0000 mL | INTRAMUSCULAR | Status: AC | PRN
  Administered 2021-07-09: 2 mL via INTRA_ARTICULAR

## 2021-07-09 MED ORDER — LIDOCAINE HCL 1 % IJ SOLN
2.0000 mL | INTRAMUSCULAR | Status: AC | PRN
  Administered 2021-07-09: 2 mL

## 2021-07-09 NOTE — Progress Notes (Signed)
? ?Office Visit Note ?  ?Patient: William Leon           ?Date of Birth: [birth year]           ?MRN: [id number] ?Visit Date: 07/09/2021 ?             ?Requested by: Midge Minium, MD ?[address] ?[place],  [place] [address] ?PCP: Midge Minium, MD ? ? ?Assessment & Plan: ?Visit Diagnoses:  ?1. S/P left knee arthroscopy   ?2. Acute medial meniscus tear of left knee, initial encounter   ? ? ?Plan: [place] is status post left knee partial meniscectomy and chondroplasty 05/28/2001.  He is experiencing swelling and jabbing pain.  He has been back to work since a couple days after the surgery.  Denies any constitutional symptoms. ? ?Examination of left knee shows a large joint effusion.  No evidence of infection.  Range of motion is slightly decreased secondary to effusion.  No joint line tenderness. ? ?Impression is postsurgical inflammatory joint effusion.  I aspirated 82 cc of synovial fluid and injected with cortisone.  Recommend relative rest. ? ?Follow-Up Instructions: No follow-ups on file.  ? ?Orders:  ?No orders of the defined types were placed in this encounter. ? ?No orders of the defined types were placed in this encounter. ? ? ? ? Procedures: ?Large Joint Inj: L knee on 07/09/2021 10:57 AM ?Details: 22 G needle ?Medications: 2 mL bupivacaine 0.5 %; 2 mL lidocaine 1 %; 40 mg methylPREDNISolone acetate 40 MG/ML ?Outcome: tolerated well, no immediate complications ?Patient was prepped and draped in the usual sterile fashion.  ? ? ? ? ?Clinical Data: ?No additional findings. ? ? ?Subjective: ?Chief Complaint  ?Patient presents with  ? Left Knee - Follow-up  ?  S/p knee arthroscopy 05/28/2021  ? ? ?HPI ? ?Review of Systems ? ? ?Objective: ?Vital Signs: There were no vitals taken for this visit. ? ?Physical Exam ? ?Ortho Exam ? ?Specialty Comments:  ?No specialty comments available. ? ?Imaging: ?No results found. ? ? ?PMFS History: ?Patient Active Problem List  ? Diagnosis Date Noted  ? H. pylori  duodenitis 02/27/2020  ? Hypocalcemia 02/19/2020  ? Anemia   ? Gastric ulcer with hemorrhage   ? Melena 02/18/2020  ? Hyperlipidemia   ? Azotemia   ? Leukocytosis   ? Obesity (BMI 30-39.9) 04/11/2019  ? Plantar fasciitis 10/31/2017  ? Achilles tendon rupture, right, subsequent encounter 02/07/2017  ? Penile trauma 06/18/2014  ? Elevated serum creatinine 05/22/2014  ? Physical exam, annual 11/04/2011  ? TESTICULAR MASS, LEFT 10/22/2009  ? LENTIGO 10/01/2009  ? ?Past Medical History:  ?Diagnosis Date  ? Allergy   ? seasonal-ragweed  ? Gastric ulcer   ? H. pylori infection   ? Hyperlipidemia   ?  ?Family History  ?Problem Relation Age of Onset  ? Alcohol abuse Brother   ? Bowel Disease Brother   ? Colon cancer Neg Hx   ? Colon polyps Neg Hx   ? Esophageal cancer Neg Hx   ? Rectal cancer Neg Hx   ? Stomach cancer Neg Hx   ?  ?Past Surgical History:  ?Procedure Laterality Date  ? ACHILLES TENDON SURGERY Right 01/2017  ? BIOPSY  02/19/2020  ? Procedure: BIOPSY;  Surgeon: Thornton Park, MD;  Location: [facility];  Service: Gastroenterology;;  ? ESOPHAGOGASTRODUODENOSCOPY (EGD) WITH PROPOFOL N/A 02/19/2020  ? Procedure: ESOPHAGOGASTRODUODENOSCOPY (EGD) WITH PROPOFOL;  Surgeon: Thornton Park, MD;  Location: [facility];  Service: Gastroenterology;  Laterality: N/A;  ? NO PAST SURGERIES    ? ?Social History  ? ?Occupational History  ? Not on file  ?Tobacco Use  ? Smoking status: Never  ? Smokeless tobacco: Never  ?Vaping Use  ? Vaping Use: Never used  ?Substance and Sexual Activity  ? Alcohol use: No  ?  Alcohol/week: 0.0 standard drinks  ? Drug use: No  ? Sexual activity: Not on file  ? ? ? ? ? ? ?

## 2021-07-22 ENCOUNTER — Encounter: Payer: Self-pay | Admitting: Orthopaedic Surgery

## 2021-07-22 NOTE — Telephone Encounter (Signed)
It looked like he had moderate osteoarthritis according to the op note.  I would give it another month or so to see if it improves.  The more activity he does, the more pain and swelling he may get.  If still having pain in a month, I would come back in and see xu

## 2021-11-11 ENCOUNTER — Encounter: Payer: Self-pay | Admitting: Orthopaedic Surgery

## 2021-11-24 ENCOUNTER — Ambulatory Visit: Payer: No Typology Code available for payment source | Admitting: Family Medicine

## 2021-11-24 ENCOUNTER — Encounter: Payer: Self-pay | Admitting: Family Medicine

## 2021-11-24 VITALS — BP 130/70 | HR 74 | Temp 97.4°F | Resp 19 | Ht 71.0 in | Wt 261.6 lb

## 2021-11-24 DIAGNOSIS — E785 Hyperlipidemia, unspecified: Secondary | ICD-10-CM

## 2021-11-24 DIAGNOSIS — E669 Obesity, unspecified: Secondary | ICD-10-CM

## 2021-11-24 DIAGNOSIS — Z23 Encounter for immunization: Secondary | ICD-10-CM

## 2021-11-24 LAB — BASIC METABOLIC PANEL
BUN: 17 mg/dL (ref 6–23)
CO2: 28 mEq/L (ref 19–32)
Calcium: 9.7 mg/dL (ref 8.4–10.5)
Chloride: 105 mEq/L (ref 96–112)
Creatinine, Ser: 1.5 mg/dL (ref 0.40–1.50)
GFR: 53.14 mL/min — ABNORMAL LOW (ref >60.00)
Glucose, Bld: 80 mg/dL (ref 70–99)
Potassium: 4 mEq/L (ref 3.5–5.1)
Sodium: 140 mEq/L (ref 135–145)

## 2021-11-24 LAB — CBC WITH DIFFERENTIAL/PLATELET
Basophils Absolute: 0 10*3/uL (ref 0.0–0.1)
Basophils Relative: 0.7 % (ref 0.0–3.0)
Eosinophils Absolute: 0.2 10*3/uL (ref 0.0–0.7)
Eosinophils Relative: 3.4 % (ref 0.0–5.0)
HCT: 48.2 % (ref 39.0–52.0)
Hemoglobin: 15.9 g/dL (ref 13.0–17.0)
Lymphocytes Relative: 30 % (ref 12.0–46.0)
Lymphs Abs: 2.1 10*3/uL (ref 0.7–4.0)
MCHC: 32.9 g/dL (ref 30.0–36.0)
MCV: 88.9 fl (ref 78.0–100.0)
Monocytes Absolute: 0.6 10*3/uL (ref 0.1–1.0)
Monocytes Relative: 9.1 % (ref 3.0–12.0)
Neutro Abs: 3.9 10*3/uL (ref 1.4–7.7)
Neutrophils Relative %: 56.8 % (ref 43.0–77.0)
Platelets: 167 10*3/uL (ref 150.0–400.0)
RBC: 5.42 Mil/uL (ref 4.22–5.81)
RDW: 14.9 % (ref 11.5–15.5)
WBC: 6.9 10*3/uL (ref 4.0–10.5)

## 2021-11-24 LAB — LIPID PANEL
Cholesterol: 138 mg/dL (ref 0–200)
HDL: 35.9 mg/dL — ABNORMAL LOW (ref >39.00)
LDL Cholesterol: 72 mg/dL (ref 0–99)
NonHDL: 102.51
Total CHOL/HDL Ratio: 4
Triglycerides: 152 mg/dL — ABNORMAL HIGH (ref 0.0–149.0)
VLDL: 30.4 mg/dL (ref 0.0–40.0)

## 2021-11-24 LAB — HEPATIC FUNCTION PANEL
ALT: 34 U/L (ref 0–53)
AST: 34 U/L (ref 0–37)
Albumin: 4.2 g/dL (ref 3.5–5.2)
Alkaline Phosphatase: 55 U/L (ref 39–117)
Bilirubin, Direct: 0.1 mg/dL (ref 0.0–0.3)
Total Bilirubin: 0.6 mg/dL (ref 0.2–1.2)
Total Protein: 7.1 g/dL (ref 6.0–8.3)

## 2021-11-24 NOTE — Patient Instructions (Signed)
Schedule your complete physical in 6 months We'll notify you of your lab results and make any changes if needed Continue to work on healthy diet and regular exercise- you can do it! Call with any questions or concerns Stay Safe!  Stay Healthy! Enjoy the rest of your summer!!!

## 2021-11-24 NOTE — Progress Notes (Signed)
   Subjective:    Patient ID: William Leon, male    DOB: 08/01/1970, 51 y.o.   MRN: [id number]  HPI Hyperlipidemia- chronic problem, on Crestor '10mg'$  daily.  Pt reports not taking this regularly b/c he is taking Omega XL.  No CP, SOB, N/V.  Intermittent RLQ pain x2 weeks- 'just a twinge'  Obesity- pt has gained 5 lbs since Feb.  Pt is walking at work but no regular exercise.   Review of Systems For ROS see HPI     Objective:   Physical Exam Vitals reviewed.  Constitutional:      General: He is not in acute distress.    Appearance: Normal appearance. He is well-developed. He is not ill-appearing.  HENT:     Head: Normocephalic and atraumatic.  Eyes:     Extraocular Movements: Extraocular movements intact.     Conjunctiva/sclera: Conjunctivae normal.     Pupils: Pupils are equal, round, and reactive to light.  Neck:     Thyroid: No thyromegaly.  Cardiovascular:     Rate and Rhythm: Normal rate and regular rhythm.     Pulses: Normal pulses.     Heart sounds: Normal heart sounds. No murmur heard. Pulmonary:     Effort: Pulmonary effort is normal. No respiratory distress.     Breath sounds: Normal breath sounds.  Abdominal:     General: Bowel sounds are normal. There is no distension.     Palpations: Abdomen is soft.  Musculoskeletal:     Cervical back: Normal range of motion and neck supple.     Right lower leg: No edema.     Left lower leg: No edema.  Lymphadenopathy:     Cervical: No cervical adenopathy.  Skin:    General: Skin is warm and dry.  Neurological:     General: No focal deficit present.     Mental Status: He is alert and oriented to person, place, and time.     Cranial Nerves: No cranial nerve deficit.  Psychiatric:        Mood and Affect: Mood normal.        Behavior: Behavior normal.           Assessment & Plan:

## 2021-11-24 NOTE — Assessment & Plan Note (Signed)
Chronic problem.  Currently on Crestor '10mg'$  daily but he admits he is not taking regularly b/c he is taking Omega XL.  Currently asymptomatic.  Check labs.  Adjust meds prn

## 2021-11-24 NOTE — Assessment & Plan Note (Signed)
Ongoing issue.  BMI is likely misleading due to muscle mass.  Encouraged healthy diet and regular exercise.  Will follow.

## 2021-11-25 NOTE — Progress Notes (Signed)
Pt seen results Via my chart  

## 2021-12-16 ENCOUNTER — Encounter: Payer: Self-pay | Admitting: Orthopaedic Surgery

## 2021-12-16 ENCOUNTER — Ambulatory Visit (INDEPENDENT_AMBULATORY_CARE_PROVIDER_SITE_OTHER): Payer: No Typology Code available for payment source | Admitting: Orthopaedic Surgery

## 2021-12-16 DIAGNOSIS — S83242A Other tear of medial meniscus, current injury, left knee, initial encounter: Secondary | ICD-10-CM

## 2021-12-16 DIAGNOSIS — M1712 Unilateral primary osteoarthritis, left knee: Secondary | ICD-10-CM

## 2021-12-16 DIAGNOSIS — Z9889 Other specified postprocedural states: Secondary | ICD-10-CM | POA: Diagnosis not present

## 2021-12-16 MED ORDER — BUPIVACAINE HCL 0.5 % IJ SOLN
2.0000 mL | INTRAMUSCULAR | Status: AC | PRN
  Administered 2021-12-16: 2 mL via INTRA_ARTICULAR

## 2021-12-16 MED ORDER — METHYLPREDNISOLONE ACETATE 40 MG/ML IJ SUSP
40.0000 mg | INTRAMUSCULAR | Status: AC | PRN
  Administered 2021-12-16: 40 mg via INTRA_ARTICULAR

## 2021-12-16 MED ORDER — LIDOCAINE HCL 1 % IJ SOLN
2.0000 mL | INTRAMUSCULAR | Status: AC | PRN
  Administered 2021-12-16: 2 mL

## 2021-12-16 NOTE — Addendum Note (Signed)
Addended by: Lendon Collar on: 12/16/2021 09:44 AM   Modules accepted: Orders

## 2021-12-16 NOTE — Progress Notes (Signed)
Office Visit Note   Patient: William Leon           Date of Birth: 06/15/1970           MRN: [id number] Visit Date: 12/16/2021              Requested by: Midge Minium, MD [address] [place],  [place] [id number] PCP: Midge Minium, MD   Assessment & Plan: Visit Diagnoses:  1. S/P left knee arthroscopy   2. Acute medial meniscus tear of left knee, initial encounter   3. Primary osteoarthritis of left knee     Plan: [name] comes back today for recurrent left knee pain and swelling.  Underwent left knee arthroscopy in February for medial meniscal tear.  We aspirated 80 cc joint effusion and injected with cortisone at the end of March.  He states that relief was temporary.  He has gone back to work.  He has medial sided knee pain down towards the proximal tibia and less so along the medial joint line.  He states that the pain does not feel like the meniscal tear pain.  Denies any mechanical symptoms.  Feels bogginess and tightness in the knee joint.  Examination of the left knee shows moderate joint effusion.  No signs of infection.  Mild restriction in range of motion secondary to the effusion.  Slight medial joint line tenderness and tenderness to the pes bursa.  Impression is recurrent left knee joint effusion and medial sided knee pain.  We will reaspirate and inject today.  Due to recurrent symptoms despite previous aspiration injection we will need an MRI to rule out structural abnormalities.  Follow-up after the MRI.  60 cc aspirated today which was sent to the lab.  I will call the patient with the results.  Follow-Up Instructions: No follow-ups on file.   Orders:  No orders of the defined types were placed in this encounter.  No orders of the defined types were placed in this encounter.     Procedures: Large Joint Inj: L knee on 12/16/2021 9:25 AM Details: 22 G needle Medications: 2 mL bupivacaine 0.5 %; 2 mL lidocaine 1 %; 40 mg methylPREDNISolone acetate 40  MG/ML Outcome: tolerated well, no immediate complications Patient was prepped and draped in the usual sterile fashion.       Clinical Data: No additional findings.   Subjective: Chief Complaint  Patient presents with   Left Knee - Follow-up    Left knee scope 05/28/2021    HPI  Review of Systems   Objective: Vital Signs: There were no vitals taken for this visit.  Physical Exam  Ortho Exam  Specialty Comments:  No specialty comments available.  Imaging: No results found.   PMFS History: Patient Active Problem List   Diagnosis Date Noted   H. pylori duodenitis 02/27/2020   Hypocalcemia 02/19/2020   Anemia    Gastric ulcer with hemorrhage    Melena 02/18/2020   Hyperlipidemia    Azotemia    Leukocytosis    Obesity (BMI 30-39.9) 04/11/2019   Plantar fasciitis 10/31/2017   Achilles tendon rupture, right, subsequent encounter 02/07/2017   Penile trauma 06/18/2014   Elevated serum creatinine 05/22/2014   Physical exam, annual 11/04/2011   TESTICULAR MASS, LEFT 10/22/2009   LENTIGO 10/01/2009   Past Medical History:  Diagnosis Date   Allergy    seasonal-ragweed   Gastric ulcer    H. pylori infection    Hyperlipidemia  Family History  Problem Relation Age of Onset   Alcohol abuse Brother    Bowel Disease Brother    Colon cancer Neg Hx    Colon polyps Neg Hx    Esophageal cancer Neg Hx    Rectal cancer Neg Hx    Stomach cancer Neg Hx     Past Surgical History:  Procedure Laterality Date   ACHILLES TENDON SURGERY Right 01/2017   BIOPSY  02/19/2020   Procedure: BIOPSY;  Surgeon: Thornton Park, MD;  Location: [facility];  Service: Gastroenterology;;   ESOPHAGOGASTRODUODENOSCOPY (EGD) WITH PROPOFOL N/A 02/19/2020   Procedure: ESOPHAGOGASTRODUODENOSCOPY (EGD) WITH PROPOFOL;  Surgeon: Thornton Park, MD;  Location: [facility];  Service: Gastroenterology;  Laterality: N/A;   NO PAST SURGERIES     Social History   Occupational History    Not on file  Tobacco Use   Smoking status: Never   Smokeless tobacco: Never  Vaping Use   Vaping Use: Never used  Substance and Sexual Activity   Alcohol use: No    Alcohol/week: 0.0 standard drinks of alcohol   Drug use: No   Sexual activity: Not on file

## 2021-12-22 LAB — SYNOVIAL FLUID ANALYSIS, COMPLETE
Basophils, %: 0 %
Eosinophils-Synovial: 0 % (ref 0–2)
Lymphocytes-Synovial Fld: 44 % (ref 0–74)
Monocyte/Macrophage: 42 % (ref 0–69)
Neutrophil, Synovial: 13 % (ref 0–24)
Synoviocytes, %: 0 % (ref 0–15)
WBC, Synovial: 516 cells/uL — ABNORMAL HIGH (ref <150)

## 2021-12-22 LAB — ANAEROBIC AND AEROBIC CULTURE
AER RESULT:: NO GROWTH
MICRO NUMBER:: 13879126
MICRO NUMBER:: 13879127
SPECIMEN QUALITY:: ADEQUATE
SPECIMEN QUALITY:: ADEQUATE

## 2021-12-31 ENCOUNTER — Ambulatory Visit
Admission: RE | Admit: 2021-12-31 | Discharge: 2021-12-31 | Disposition: A | Discharge status: Home / Self Care | Payer: No Typology Code available for payment source | Source: Ambulatory Visit | Attending: Orthopaedic Surgery | Admitting: Orthopaedic Surgery

## 2021-12-31 DIAGNOSIS — M1712 Unilateral primary osteoarthritis, left knee: Secondary | ICD-10-CM

## 2021-12-31 DIAGNOSIS — S83242A Other tear of medial meniscus, current injury, left knee, initial encounter: Secondary | ICD-10-CM

## 2021-12-31 DIAGNOSIS — Z9889 Other specified postprocedural states: Secondary | ICD-10-CM

## 2022-01-02 ENCOUNTER — Telehealth: Payer: Self-pay | Admitting: Orthopaedic Surgery

## 2022-01-02 NOTE — Telephone Encounter (Signed)
Spoke with patient about MRI.  No new meniscal pathology.  Findings consistent with chondromalacia and OA.  Will treat the OA at this time.  Declined unloader brace for now.

## 2022-01-04 ENCOUNTER — Telehealth: Payer: Self-pay | Admitting: Physician Assistant

## 2022-01-04 NOTE — Telephone Encounter (Signed)
Inbound call from patient stating that he believes  that his ulcers are bleeding again. Patient is seeking advice if there is any medication he can take to help or if he needs to be seen in the office. Please advise.

## 2022-01-04 NOTE — Telephone Encounter (Signed)
Returned call to patient. Patient reports that on Friday/Saturday he passed brown, sticky stool. Pt also saw red specks of blood in the commode, he did not check to see if there was any BRB with wiping. Pt has not noticed any bleeding since then. Pt is not on any iron or pepto-bismol. Pt has been scheduled for a follow up with Carl Best, NP on Friday, 01/08/22 at 9:00 am. Pt knows that he will need to go to the ED if he develops any SOB, dizziness, bleeding, or fatigue prior to his appt. Pt verbalized understanding and had no concerns at the end of the call.

## 2022-01-08 ENCOUNTER — Ambulatory Visit (INDEPENDENT_AMBULATORY_CARE_PROVIDER_SITE_OTHER): Payer: No Typology Code available for payment source | Admitting: Nurse Practitioner

## 2022-01-08 ENCOUNTER — Other Ambulatory Visit (INDEPENDENT_AMBULATORY_CARE_PROVIDER_SITE_OTHER): Payer: No Typology Code available for payment source

## 2022-01-08 ENCOUNTER — Encounter: Payer: Self-pay | Admitting: Nurse Practitioner

## 2022-01-08 VITALS — BP 128/90 | HR 67 | Ht 71.0 in | Wt 248.0 lb

## 2022-01-08 DIAGNOSIS — K5900 Constipation, unspecified: Secondary | ICD-10-CM

## 2022-01-08 DIAGNOSIS — K625 Hemorrhage of anus and rectum: Secondary | ICD-10-CM

## 2022-01-08 LAB — BASIC METABOLIC PANEL
BUN: 19 mg/dL (ref 6–23)
CO2: 27 mEq/L (ref 19–32)
Calcium: 9.5 mg/dL (ref 8.4–10.5)
Chloride: 102 mEq/L (ref 96–112)
Creatinine, Ser: 1.57 mg/dL — ABNORMAL HIGH (ref 0.40–1.50)
GFR: 50.27 mL/min — ABNORMAL LOW (ref >60.00)
Glucose, Bld: 128 mg/dL — ABNORMAL HIGH (ref 70–99)
Potassium: 3.5 mEq/L (ref 3.5–5.1)
Sodium: 138 mEq/L (ref 135–145)

## 2022-01-08 LAB — CBC
HCT: 48.8 % (ref 39.0–52.0)
Hemoglobin: 16.5 g/dL (ref 13.0–17.0)
MCHC: 33.8 g/dL (ref 30.0–36.0)
MCV: 88.1 fl (ref 78.0–100.0)
Platelets: 153 10*3/uL (ref 150.0–400.0)
RBC: 5.54 Mil/uL (ref 4.22–5.81)
RDW: 14.5 % (ref 11.5–15.5)
WBC: 5.6 10*3/uL (ref 4.0–10.5)

## 2022-01-08 NOTE — Patient Instructions (Signed)
Your provider has requested that you go to the basement level for lab work before leaving today. Press "B" on the elevator. The lab is located at the first door on the left as you exit the elevator.  You will be due for a recall colonoscopy in 04/2022. We will send you a reminder in the mail when it gets closer to that time.  Start over the counter Miralax at bedtime as needed to avoid straining.   Contact the office if your rectal bleeding recurs of if black stools develop.  The [facility] providers would like to encourage you to use [facility] to communicate with providers for non-urgent requests or questions.  Due to long hold times on the telephone, sending your provider a message by [facility] may be a faster and more efficient way to get a response.  Please allow 48 business hours for a response.  Please remember that this is for non-urgent requests.   Due to recent changes in healthcare laws, you may see the results of your imaging and laboratory studies on MyChart before your provider has had a chance to review them.  We understand that in some cases there may be results that are confusing or concerning to you. Not all laboratory results come back in the same time frame and the provider may be waiting for multiple results in order to interpret others.  Please give [place] 48 hours in order for your provider to thoroughly review all the results before contacting the office for clarification of your results.

## 2022-01-08 NOTE — Progress Notes (Signed)
01/08/2022 VOYD GROFT [id number] Feb 20, 1971   Chief Complaint: Specs of blood in stool  History of Present Illness: William Leon is a 51 year old male with a past medical history of hyperlipidemia, UGI bleed secondary to H. Pylori gastric ulcer 02/2020 and tubular adenomatous colon polyps.  He presents today for further evaluation regarding blood in his stool.  He is attempting to lose weight and started a fasting program 2 weeks ago.  He does not eat from 8pm until 12PM.  One day last week, he passed a light brown soft stool with specks of red blood without recurrence. No black tarry stools. He developed a bit of constipation 2 days ago, passed a hard stool with straining and his anal area felt a little sore. He took a laxative tab and passed a normal brown BM with any obvious blood around 5pm yesterday.  No abdominal pain.  Due to his history of a upper GI bleed which required hospitalization 02/2020, he was concerned that he was having GI bleeding.  No NSAID use.  No alcohol use.   He was admitted to the hospital 02/2020 with upper GI bleed/melena.  He underwent an EGD 02/19/2020 which showed 2 nonbleeding gastric ulcers and duodenitis.  Biopsies were positive for H. pylori and he was treated with Protonix 40 mg twice daily and a 10-day course of bismuth, Tetracycline and Flagyl.  He reported taking BC powder a few times but not chronically prior to this hospital admission.  He underwent a repeat EGD 05/15/2020 which showed interval healing of the prior gastric ulcer, biopsies were consistent with chronic gastritis without H. pylori.  A 2 cm hiatal hernia was also noted.  He underwent a colonoscopy 05/04/2019 which identified 10 tubular adenomatous polyps removed from the colon and 1 tubular adenomatous polyp was removed from the rectum.  He was advised by Dr. Havery Moros to repeat a colonoscopy 04/2022.     Latest Ref Rng & Units 11/24/2021    9:06 AM 04/29/2021   11:34 AM 10/21/2020   10:24  AM  CBC  WBC 4.0 - 10.5 K/uL 6.9  8.1  7.6   Hemoglobin 13.0 - 17.0 g/dL 15.9  16.7  15.8   Hematocrit 39.0 - 52.0 % 48.2  50.8  47.0   Platelets 150.0 - 400.0 K/uL 167.0  165.0  162.0        Latest Ref Rng & Units 11/24/2021    9:06 AM 04/29/2021   11:34 AM 01/28/2021    9:44 AM  CMP  Glucose 70 - 99 mg/dL 80  82  99   BUN 6 - 23 mg/dL '17  21  16   '$ Creatinine 0.40 - 1.50 mg/dL 1.50  1.56  1.57   Sodium 135 - 145 mEq/L 140  139  138   Potassium 3.5 - 5.1 mEq/L 4.0  4.1  3.8   Chloride 96 - 112 mEq/L 105  104  104   CO2 19 - 32 mEq/L '28  27  26   '$ Calcium 8.4 - 10.5 mg/dL 9.7  9.4  9.5   Total Protein 6.0 - 8.3 g/dL 7.1  7.0    Total Bilirubin 0.2 - 1.2 mg/dL 0.6  0.6    Alkaline Phos 39 - 117 U/L 55  54    AST 0 - 37 U/L 34  40    ALT 0 - 53 U/L 34  51      EGD 05/15/2020: - Esophagogastric landmarks identified. -  2 cm hiatal hernia. - Normal esophagus otherwise - Interval healing of prior gastric ulcer. Some erythematous mucosa with slight nodularity at the site, benign appearing. Biopsied. - Normal stomach otherwise - biopsies taken for H pylori eradication testing. - Normal duodenal bulb and second portion of the duodenum. 1. Surgical [P], gastric antrum - REACTIVE GASTROPATHY - NO H. PYLORI OR INTESTINAL METAPLASIA IDENTIFIED - SEE COMMENT 2. Surgical [P], gastric antrum and gastric body - MILD CHRONIC GASTRITIS WITH REACTIVE CHANGES - NO H. PYLORI OR INTESTINAL METAPLASIA IDENTIFIED - SEE COMMENT  EGD 02/19/2020: - Normal esophagus. - Normal esophagus. Inpatient - Medium-sized hiatal hernia. - Gastritis. Biopsied. - Two non-bleeding gastric ulcers with no stigmata of bleeding. These are likely due to NSAIDs. - Duodenitis. - Biopsies positive for H pylori - He was treated with Protonix 40 mg twice daily, also completed a 10-day course of bismuth, tetracycline, and Flagyl to treat the H. Pylori  Colonoscopy 05/04/2019: - The examined portion of the ileum was  normal. - One 8 to 10 mm polyp in the cecum, removed with a cold snare. Resected and retrieved. - Four 3 to 7 mm polyps in the ascending colon, removed with a cold snare. Resected and - Four 3 to 7 mm polyps in the ascending colon, removed with a cold snare. Resected and retrieved. - One 3 mm polyp in the transverse colon, removed with a cold snare. Resected and retrieved. - Diverticulosis in the sigmoid colon. - One 3 mm polyp in the rectum, removed with a cold snare. Resected and retrieved. - Internal hemorrhoids. - The examination was otherwise normal. - 3 Year colonoscopy recall - TUBULAR ADENOMA(S) - NEGATIVE FOR HIGH-GRADE DYSPLASIA OR MALIGNANCY   Past Medical History:  Diagnosis Date   Allergy    seasonal-ragweed   Gastric ulcer    H. pylori infection    Hyperlipidemia    Past Surgical History:  Procedure Laterality Date   ACHILLES TENDON SURGERY Right 01/2017   BIOPSY  02/19/2020   Procedure: BIOPSY;  Surgeon: Thornton Park, MD;  Location: [facility];  Service: Gastroenterology;;   ESOPHAGOGASTRODUODENOSCOPY (EGD) WITH PROPOFOL N/A 02/19/2020   Procedure: ESOPHAGOGASTRODUODENOSCOPY (EGD) WITH PROPOFOL;  Surgeon: Thornton Park, MD;  Location: [facility];  Service: Gastroenterology;  Laterality: N/A;   NO PAST SURGERIES     Current Outpatient Medications on File Prior to Visit  Medication Sig Dispense Refill   rosuvastatin (CRESTOR) 10 MG tablet Take 1 tablet (10 mg total) by mouth daily. 90 tablet 1   No current facility-administered medications on file prior to visit.   No Known Allergies  Current Medications, Allergies, Past Medical History, Past Surgical History, Family History and Social History were reviewed in [employer] record.   Review of Systems:   Constitutional: Negative for fever, sweats or chills.  Intentionally lost 13 pounds. Respiratory: Negative for shortness of breath.   Cardiovascular: Negative for chest pain,  palpitations and leg swelling.  Gastrointestinal: See HPI.  Musculoskeletal: Negative for back pain or muscle aches.  Neurological: Negative for dizziness, headaches or paresthesias.    Physical Exam: BP (!) 128/90   Pulse 67   Ht '5\' 11"'$  (1.803 m)   Wt 248 lb (112.5 kg)   BMI 34.59 kg/m  Wt Readings from Last 3 Encounters:  01/08/22 248 lb (112.5 kg)  11/24/21 261 lb 9.6 oz (118.7 kg)  05/27/21 257 lb (116.6 kg)    General: 51 year old male in no acute distress. Head: Normocephalic and atraumatic. Eyes: No  scleral icterus. Conjunctiva pink . Ears: Normal auditory acuity. Mouth: Dentition intact. No ulcers or lesions.  Lungs: Clear throughout to auscultation. Heart: Regular rate and rhythm, no murmur. Abdomen: Soft, nontender and nondistended. No masses or hepatomegaly. Normal bowel sounds x 4 quadrants.  Rectal: Patient declined exam. Musculoskeletal: Symmetrical with no gross deformities. Extremities: No edema. Neurological: Alert oriented x 4. No focal deficits.  Psychological: Alert and cooperative. Normal mood and affect  Assessment and Recommendations:  65) 51 year old male with a history of UGI bleed/melena secondary to H. Pylori positive gastric ulcer and BC powder use 02/2020.  H. Pylori was treated. Repeat EGD 05/2020 showed healing of the prior gastric ulcer without further evidence of H. pylori.  He remains off PPI therapy.  No melena or GERD symptoms. -Patient to contact our office if he develops GERD symptoms or recurrence of black stools  2) Rectal bleeding, likely hemorrhoidal or perianal irritation.  Patient passed a normal brown bowel movement about 1 week ago with specks of red blood without recurrence. -CBC, BMP -MiraLAX as needed to avoid straining -Schedule a diagnostic colonoscopy if rectal bleeding recurs or if labs show anemia.  3) History of colon polyps. Colonoscopy 05/04/2019 identified 7 TA polyps removed from the colon and rectum -Next colonoscopy  due 04/2022  4) Jehovah's Witness, declines any blood product

## 2022-01-09 NOTE — Progress Notes (Signed)
Agree with assessment and plan as outlined.  

## 2022-01-19 ENCOUNTER — Encounter: Payer: Self-pay | Admitting: Family Medicine

## 2022-01-19 ENCOUNTER — Ambulatory Visit (INDEPENDENT_AMBULATORY_CARE_PROVIDER_SITE_OTHER): Payer: No Typology Code available for payment source | Admitting: Family Medicine

## 2022-01-19 VITALS — BP 136/80 | HR 70 | Temp 97.7°F | Resp 17 | Ht 71.0 in | Wt 253.2 lb

## 2022-01-19 DIAGNOSIS — R2230 Localized swelling, mass and lump, unspecified upper limb: Secondary | ICD-10-CM | POA: Diagnosis not present

## 2022-01-19 DIAGNOSIS — Z789 Other specified health status: Secondary | ICD-10-CM | POA: Diagnosis not present

## 2022-01-19 NOTE — Patient Instructions (Addendum)
Follow up around Thanksgiving to recheck liver and kidney functions after starting the supplements We'll call you to schedule your appt w/ the hand specialist These are usually benign but worth investigating Call with any questions or concerns Happy Fall!!!

## 2022-01-19 NOTE — Progress Notes (Signed)
   Subjective:    Patient ID: William Leon, male    DOB: [birth year], 51 y.o.   MRN: [id number]  HPI Hand pain- pt notes a hard area in L palm.  Painful if William Leon hits it or puts pressure on it.  Initially was smaller but now enlarging.  Pt is not sure when this started.  Denies trigger finger.  Supplement advice- pt reports William Leon has ordered [employer] from [place] and wants to start [place].  William Leon wanted my opinion on whether these would be safe to take   Review of Systems For ROS see HPI     Objective:   Physical Exam Vitals reviewed.  Constitutional:      General: William Leon is not in acute distress.    Appearance: Normal appearance. William Leon is not ill-appearing.  HENT:     Head: Normocephalic and atraumatic.  Musculoskeletal:        General: Tenderness (TTP over 2cm, freely mobile L palmar mass) present.  Skin:    General: Skin is warm and dry.  Neurological:     General: No focal deficit present.     Mental Status: William Leon is alert and oriented to person, place, and time.  Psychiatric:        Mood and Affect: Mood normal.        Behavior: Behavior normal.        Thought Content: Thought content normal.           Assessment & Plan:   Mass of palm- new.  Discussed w/ pt that this is most likely a benign ganglion or neuroma.  But given his discomfort, it is worth having him see a hand specialist to review options.  Pt expressed understanding and is in agreement w/ plan.   Supplement advice- new.  reviewed the ingredients of [facility] and an NIH article on [place].  Shilajet is reportedly very safe to use.  Ralph Brouwer has multiple ingredients- some of which I'm not familiar with.  Told him this is likely ok to take but I would want him to come in for a visit to check BP and labs (kidney and liver functions) ~1 month after starting supplement to ensure William Leon is metabolizing this appropriately.  Pt expressed understanding and is in agreement w/ plan.

## 2022-03-03 ENCOUNTER — Telehealth: Payer: Self-pay

## 2022-03-03 ENCOUNTER — Ambulatory Visit (INDEPENDENT_AMBULATORY_CARE_PROVIDER_SITE_OTHER): Payer: No Typology Code available for payment source | Admitting: Family Medicine

## 2022-03-03 ENCOUNTER — Encounter: Payer: Self-pay | Admitting: Family Medicine

## 2022-03-03 VITALS — BP 130/78 | HR 72 | Temp 97.6°F | Resp 17 | Ht 71.0 in | Wt 251.5 lb

## 2022-03-03 DIAGNOSIS — E669 Obesity, unspecified: Secondary | ICD-10-CM | POA: Diagnosis not present

## 2022-03-03 DIAGNOSIS — R7989 Other specified abnormal findings of blood chemistry: Secondary | ICD-10-CM | POA: Diagnosis not present

## 2022-03-03 DIAGNOSIS — E785 Hyperlipidemia, unspecified: Secondary | ICD-10-CM

## 2022-03-03 LAB — CBC WITH DIFFERENTIAL/PLATELET
Basophils Absolute: 0.1 10*3/uL (ref 0.0–0.1)
Basophils Relative: 1 % (ref 0.0–3.0)
Eosinophils Absolute: 0.3 10*3/uL (ref 0.0–0.7)
Eosinophils Relative: 3.7 % (ref 0.0–5.0)
HCT: 50.6 % (ref 39.0–52.0)
Hemoglobin: 16.7 g/dL (ref 13.0–17.0)
Lymphocytes Relative: 29.1 % (ref 12.0–46.0)
Lymphs Abs: 2.1 10*3/uL (ref 0.7–4.0)
MCHC: 33.1 g/dL (ref 30.0–36.0)
MCV: 88.2 fl (ref 78.0–100.0)
Monocytes Absolute: 0.6 10*3/uL (ref 0.1–1.0)
Monocytes Relative: 7.9 % (ref 3.0–12.0)
Neutro Abs: 4.2 10*3/uL (ref 1.4–7.7)
Neutrophils Relative %: 58.3 % (ref 43.0–77.0)
Platelets: 190 10*3/uL (ref 150.0–400.0)
RBC: 5.74 Mil/uL (ref 4.22–5.81)
RDW: 14.7 % (ref 11.5–15.5)
WBC: 7.1 10*3/uL (ref 4.0–10.5)

## 2022-03-03 LAB — BASIC METABOLIC PANEL
BUN: 17 mg/dL (ref 6–23)
CO2: 29 mEq/L (ref 19–32)
Calcium: 9.4 mg/dL (ref 8.4–10.5)
Chloride: 103 mEq/L (ref 96–112)
Creatinine, Ser: 1.41 mg/dL (ref 0.40–1.50)
GFR: 57.13 mL/min — ABNORMAL LOW (ref >60.00)
Glucose, Bld: 89 mg/dL (ref 70–99)
Potassium: 4.2 mEq/L (ref 3.5–5.1)
Sodium: 139 mEq/L (ref 135–145)

## 2022-03-03 LAB — HEPATIC FUNCTION PANEL
ALT: 26 U/L (ref 0–53)
AST: 25 U/L (ref 0–37)
Albumin: 4.5 g/dL (ref 3.5–5.2)
Alkaline Phosphatase: 63 U/L (ref 39–117)
Bilirubin, Direct: 0.1 mg/dL (ref 0.0–0.3)
Total Bilirubin: 0.6 mg/dL (ref 0.2–1.2)
Total Protein: 7.2 g/dL (ref 6.0–8.3)

## 2022-03-03 LAB — LDL CHOLESTEROL, DIRECT: Direct LDL: 143 mg/dL

## 2022-03-03 LAB — TSH: TSH: 2.07 u[IU]/mL (ref 0.35–5.50)

## 2022-03-03 LAB — LIPID PANEL
Cholesterol: 199 mg/dL (ref 0–200)
HDL: 34.8 mg/dL — ABNORMAL LOW (ref >39.00)
NonHDL: 163.87
Total CHOL/HDL Ratio: 6
Triglycerides: 263 mg/dL — ABNORMAL HIGH (ref 0.0–149.0)
VLDL: 52.6 mg/dL — ABNORMAL HIGH (ref 0.0–40.0)

## 2022-03-03 NOTE — Assessment & Plan Note (Signed)
Chronic problem.  Pt is taking MegaOmega3s but not taking Crestor '10mg'$  consistently.  Discussed healthy diet and regular exercise.  Check labs.  Adjust meds prn

## 2022-03-03 NOTE — Telephone Encounter (Signed)
Left lab results on VM for pt

## 2022-03-03 NOTE — Patient Instructions (Signed)
Schedule your complete physical in late Feb We'll notify you of your lab results and make any changes if needed Continue to work on healthy diet and regular exercise Call with any questions or concerns Stay Safe!  Stay Healthy! Happy Holidays!!!

## 2022-03-03 NOTE — Assessment & Plan Note (Signed)
Pt's Creatinine has consistently been 1.5 --> 1.65 over the last 2 yrs.  Suspect the GFR and Cr are not accurately reflective of his size and muscle mass.  Discussed need for adequate hydration and avoidance of NSAIDs and other nephrotoxic meds.  Check labs and continue to monitor.

## 2022-03-03 NOTE — Progress Notes (Signed)
   Subjective:    Patient ID: William Leon, male    DOB: 1970-04-23, 51 y.o.   MRN: [id number]  HPI Hyperlipidemia- chronic problem, on Crestor '10mg'$  daily but not taking consistently.  No CP, SOB, abd pain, N/V.  Elevated Cr- pt's Cr has been 1.5 --> 1.65 over the last 2 yrs.  He is not on any renally excreted medications and avoids NSAIDs due to GI bleed  Obesity- pt's weight and BMI are stable (35.08)  He is very muscular and BMI is misleading   Review of Systems For ROS see HPI     Objective:   Physical Exam Vitals reviewed.  Constitutional:      General: He is not in acute distress.    Appearance: Normal appearance. He is well-developed.  HENT:     Head: Normocephalic and atraumatic.  Eyes:     Extraocular Movements: Extraocular movements intact.     Conjunctiva/sclera: Conjunctivae normal.     Pupils: Pupils are equal, round, and reactive to light.  Neck:     Thyroid: No thyromegaly.  Cardiovascular:     Rate and Rhythm: Normal rate and regular rhythm.     Pulses: Normal pulses.     Heart sounds: Normal heart sounds. No murmur heard. Pulmonary:     Effort: Pulmonary effort is normal. No respiratory distress.     Breath sounds: Normal breath sounds.  Abdominal:     General: Bowel sounds are normal. There is no distension.     Palpations: Abdomen is soft.  Musculoskeletal:     Cervical back: Normal range of motion and neck supple.     Right lower leg: No edema.     Left lower leg: No edema.  Lymphadenopathy:     Cervical: No cervical adenopathy.  Skin:    General: Skin is warm and dry.  Neurological:     General: No focal deficit present.     Mental Status: He is alert and oriented to person, place, and time.     Cranial Nerves: No cranial nerve deficit.  Psychiatric:        Mood and Affect: Mood normal.        Behavior: Behavior normal.           Assessment & Plan:

## 2022-03-03 NOTE — Telephone Encounter (Signed)
-----   Message from Midge Minium, MD sent at 03/03/2022  3:52 PM EST ----- Kidney function (creatinine) is better!  This is good news!  The triglycerides (fatty part of blood) are above goal but this will improve w/ healthy diet and regular exercise.  Your VLDL and LDL (both are bad cholesterols) are higher than last check.  Your numbers look best when you take the Crestor regularly and I would encourage you to restart.  Remainder of labs look great!

## 2022-03-03 NOTE — Assessment & Plan Note (Signed)
Ongoing issue.  Pt's BMI is misleading due to his frame size and muscle mass.  Will check labs to risk stratify.  Will continue to follow.

## 2022-04-15 ENCOUNTER — Other Ambulatory Visit (HOSPITAL_BASED_OUTPATIENT_CLINIC_OR_DEPARTMENT_OTHER): Payer: Self-pay

## 2022-04-16 ENCOUNTER — Encounter: Payer: Self-pay | Admitting: Gastroenterology

## 2022-05-19 ENCOUNTER — Ambulatory Visit (AMBULATORY_SURGERY_CENTER): Payer: No Typology Code available for payment source | Admitting: *Deleted

## 2022-05-19 ENCOUNTER — Other Ambulatory Visit (HOSPITAL_BASED_OUTPATIENT_CLINIC_OR_DEPARTMENT_OTHER): Payer: Self-pay

## 2022-05-19 VITALS — Ht 71.0 in | Wt 242.0 lb

## 2022-05-19 DIAGNOSIS — Z86010 Personal history of colonic polyps: Secondary | ICD-10-CM

## 2022-05-19 MED ORDER — NA SULFATE-K SULFATE-MG SULF 17.5-3.13-1.6 GM/177ML PO SOLN
1.0000 | Freq: Once | ORAL | 0 refills | Status: AC
  Filled 2022-05-19: qty 354, 1d supply, fill #0

## 2022-05-19 NOTE — Progress Notes (Signed)

## 2022-05-21 ENCOUNTER — Other Ambulatory Visit (HOSPITAL_BASED_OUTPATIENT_CLINIC_OR_DEPARTMENT_OTHER): Payer: Self-pay

## 2022-05-27 ENCOUNTER — Encounter: Payer: Self-pay | Admitting: Family Medicine

## 2022-05-27 ENCOUNTER — Ambulatory Visit (INDEPENDENT_AMBULATORY_CARE_PROVIDER_SITE_OTHER): Payer: No Typology Code available for payment source | Admitting: Family Medicine

## 2022-05-27 VITALS — BP 126/82 | HR 74 | Temp 98.1°F | Resp 17 | Ht 71.0 in | Wt 253.4 lb

## 2022-05-27 DIAGNOSIS — Z125 Encounter for screening for malignant neoplasm of prostate: Secondary | ICD-10-CM

## 2022-05-27 DIAGNOSIS — E785 Hyperlipidemia, unspecified: Secondary | ICD-10-CM | POA: Diagnosis not present

## 2022-05-27 DIAGNOSIS — Z0000 Encounter for general adult medical examination without abnormal findings: Secondary | ICD-10-CM | POA: Diagnosis not present

## 2022-05-27 LAB — CBC WITH DIFFERENTIAL/PLATELET
Basophils Absolute: 0 10*3/uL (ref 0.0–0.1)
Basophils Relative: 0.5 % (ref 0.0–3.0)
Eosinophils Absolute: 0.1 10*3/uL (ref 0.0–0.7)
Eosinophils Relative: 1.4 % (ref 0.0–5.0)
HCT: 51.7 % (ref 39.0–52.0)
Hemoglobin: 17.3 g/dL — ABNORMAL HIGH (ref 13.0–17.0)
Lymphocytes Relative: 26.5 % (ref 12.0–46.0)
Lymphs Abs: 1.7 10*3/uL (ref 0.7–4.0)
MCHC: 33.6 g/dL (ref 30.0–36.0)
MCV: 88.5 fl (ref 78.0–100.0)
Monocytes Absolute: 0.4 10*3/uL (ref 0.1–1.0)
Monocytes Relative: 7 % (ref 3.0–12.0)
Neutro Abs: 4.1 10*3/uL (ref 1.4–7.7)
Neutrophils Relative %: 64.6 % (ref 43.0–77.0)
Platelets: 160 10*3/uL (ref 150.0–400.0)
RBC: 5.84 Mil/uL — ABNORMAL HIGH (ref 4.22–5.81)
RDW: 14.9 % (ref 11.5–15.5)
WBC: 6.3 10*3/uL (ref 4.0–10.5)

## 2022-05-27 LAB — TSH: TSH: 1.53 u[IU]/mL (ref 0.35–5.50)

## 2022-05-27 LAB — LIPID PANEL
Cholesterol: 128 mg/dL (ref 0–200)
HDL: 31.7 mg/dL — ABNORMAL LOW (ref >39.00)
LDL Cholesterol: 80 mg/dL (ref 0–99)
NonHDL: 96.14
Total CHOL/HDL Ratio: 4
Triglycerides: 83 mg/dL (ref 0.0–149.0)
VLDL: 16.6 mg/dL (ref 0.0–40.0)

## 2022-05-27 LAB — HEPATIC FUNCTION PANEL
ALT: 33 U/L (ref 0–53)
AST: 32 U/L (ref 0–37)
Albumin: 4.3 g/dL (ref 3.5–5.2)
Alkaline Phosphatase: 56 U/L (ref 39–117)
Bilirubin, Direct: 0.2 mg/dL (ref 0.0–0.3)
Total Bilirubin: 0.7 mg/dL (ref 0.2–1.2)
Total Protein: 7.3 g/dL (ref 6.0–8.3)

## 2022-05-27 LAB — PSA: PSA: 0.24 ng/mL (ref 0.10–4.00)

## 2022-05-27 LAB — BASIC METABOLIC PANEL
BUN: 17 mg/dL (ref 6–23)
CO2: 28 mEq/L (ref 19–32)
Calcium: 10 mg/dL (ref 8.4–10.5)
Chloride: 104 mEq/L (ref 96–112)
Creatinine, Ser: 1.58 mg/dL — ABNORMAL HIGH (ref 0.40–1.50)
GFR: 49.75 mL/min — ABNORMAL LOW (ref >60.00)
Glucose, Bld: 100 mg/dL — ABNORMAL HIGH (ref 70–99)
Potassium: 4.3 mEq/L (ref 3.5–5.1)
Sodium: 141 mEq/L (ref 135–145)

## 2022-05-27 NOTE — Patient Instructions (Signed)
Follow up in 6 months to recheck cholesterol We'll notify you of your lab results and make any changes if needed Continue to work on healthy diet and regular exercise- you can do it! Call with any questions or concerns Stay Safe!  Stay Healthy! Happy Spring!

## 2022-05-27 NOTE — Assessment & Plan Note (Signed)
Pt's PE WNL w/ exception of BMI.  UTD on colonoscopy, Tdap, flu.  Check labs.  Anticipatory guidance provided.

## 2022-05-27 NOTE — Progress Notes (Signed)
   Subjective:    Patient ID: William Leon, male    DOB: [birth year], 52 y.o.   MRN: [id number]  HPI CPE- UTD on colonoscopy (scheduled), Tdap, flu.  No concerns today  Patient Care Team    Relationship Specialty Notifications Start End  Midge Minium, MD PCP - General   03/25/10     Health Maintenance  Topic Date Due   COLONOSCOPY (Pts 45-34yr Insurance coverage will need to be confirmed)  05/03/2022   Zoster Vaccines- Shingrix (1 of 2) 08/25/2022 (Originally 03/30/2019)   DTaP/Tdap/Td (3 - Td or Tdap) 11/25/2031   INFLUENZA VACCINE  Completed   Hepatitis C Screening  Completed   HIV Screening  Completed   HPV VACCINES  Aged Out   COVID-19 Vaccine  Discontinued      Review of Systems Patient reports no vision/hearing changes, anorexia, fever ,adenopathy, persistant/recurrent hoarseness, swallowing issues, chest pain, palpitations, edema, persistant/recurrent cough, hemoptysis, dyspnea (rest,exertional, paroxysmal nocturnal), gastrointestinal  bleeding (melena, rectal bleeding), abdominal pain, excessive heart burn, GU symptoms (dysuria, hematuria, voiding/incontinence issues) syncope, focal weakness, memory loss, numbness & tingling, skin/hair/nail changes, depression, anxiety, abnormal bruising/bleeding, musculoskeletal symptoms/signs.   + 10 lb weight gain    Objective:   Physical Exam General Appearance:    Alert, cooperative, no distress, appears stated age  Head:    Normocephalic, without obvious abnormality, atraumatic  Eyes:    PERRL, conjunctiva/corneas clear, EOM's intact both eyes       Ears:    Normal TM's and external ear canals, both ears  Nose:   Nares normal, septum midline, mucosa normal, no drainage   or sinus tenderness  Throat:   Lips, mucosa, and tongue normal; teeth and gums normal  Neck:   Supple, symmetrical, trachea midline, no adenopathy;       thyroid:  No enlargement/tenderness/nodules  Back:     Symmetric, no curvature, ROM normal, no CVA  tenderness  Lungs:     Clear to auscultation bilaterally, respirations unlabored  Chest wall:    No tenderness or deformity  Heart:    Regular rate and rhythm, S1 and S2 normal, no murmur, rub   or gallop  Abdomen:     Soft, non-tender, bowel sounds active all four quadrants,    no masses, no organomegaly  Genitalia:    deferred  Rectal:    Extremities:   Extremities normal, atraumatic, no cyanosis or edema  Pulses:   2+ and symmetric all extremities  Skin:   Skin color, texture, turgor normal, no rashes or lesions  Lymph nodes:   Cervical, supraclavicular, and axillary nodes normal  Neurologic:   CNII-XII intact. Normal strength, sensation and reflexes      throughout          Assessment & Plan:

## 2022-05-27 NOTE — Assessment & Plan Note (Signed)
Chronic problem.  Tolerating statin w/o difficulty.  Check labs.  Adjust meds prn  

## 2022-05-28 ENCOUNTER — Telehealth: Payer: Self-pay

## 2022-05-28 NOTE — Telephone Encounter (Signed)
-----   Message from Midge Minium, MD sent at 05/28/2022  7:45 AM EST ----- Kidney function is stable but Creatinine remains elevated.  Make sure you are drinking plenty of water.  No other changes at this time

## 2022-05-28 NOTE — Telephone Encounter (Signed)
Pt seen results Via my chart  

## 2022-06-03 ENCOUNTER — Encounter: Payer: Self-pay | Admitting: Gastroenterology

## 2022-06-10 ENCOUNTER — Encounter: Payer: Self-pay | Admitting: Certified Registered Nurse Anesthetist

## 2022-06-11 ENCOUNTER — Ambulatory Visit (AMBULATORY_SURGERY_CENTER): Payer: No Typology Code available for payment source | Admitting: Gastroenterology

## 2022-06-11 ENCOUNTER — Encounter: Payer: Self-pay | Admitting: Gastroenterology

## 2022-06-11 VITALS — BP 105/62 | HR 66 | Temp 98.6°F | Resp 12 | Ht 71.0 in | Wt 242.0 lb

## 2022-06-11 DIAGNOSIS — D123 Benign neoplasm of transverse colon: Secondary | ICD-10-CM | POA: Diagnosis not present

## 2022-06-11 DIAGNOSIS — K625 Hemorrhage of anus and rectum: Secondary | ICD-10-CM

## 2022-06-11 DIAGNOSIS — Z86010 Personal history of colonic polyps: Secondary | ICD-10-CM

## 2022-06-11 DIAGNOSIS — Z09 Encounter for follow-up examination after completed treatment for conditions other than malignant neoplasm: Secondary | ICD-10-CM | POA: Diagnosis present

## 2022-06-11 MED ORDER — SODIUM CHLORIDE 0.9 % IV SOLN: 500.0000 mL | INTRAVENOUS | Status: DC

## 2022-06-11 NOTE — Op Note (Signed)
William Leon Procedure Date: 06/11/2022 11:01 AM MRN: [id number] Endoscopist: Remo Lipps P. Havery Moros , MD, [id number] Age: 52 Referring MD:  Date of Birth: [birth year] Gender: Male Account #: [id number] Procedure:                Colonoscopy Indications:              High risk colon cancer surveillance: Personal                            history of colonic polyps Medicines:                Monitored Anesthesia Care Procedure:                Pre-Anesthesia Assessment:                           - Prior to the procedure, a History and Physical                            was performed, and patient medications and                            allergies were reviewed. The patient's tolerance of                            previous anesthesia was also reviewed. The risks                            and benefits of the procedure and the sedation                            options and risks were discussed with the patient.                            All questions were answered, and informed consent                            was obtained. Prior Anticoagulants: The patient has                            taken no anticoagulant or antiplatelet agents. ASA                            Grade Assessment: II - A patient with mild systemic                            disease. After reviewing the risks and benefits,                            the patient was deemed in satisfactory condition to                            undergo the procedure.  After obtaining informed consent, the colonoscope                            was passed under direct vision. Throughout the                            procedure, the patient's blood pressure, pulse, and                            oxygen saturations were monitored continuously. The                            CF [id number] was introduced through the anus                            and advanced to the the terminal  ileum, with                            identification of the appendiceal orifice and IC                            valve. The colonoscopy was performed without                            difficulty. The patient tolerated the procedure                            well. The quality of the bowel preparation was                            good. The ileocecal valve, appendiceal orifice, and                            rectum were photographed. Scope In: 11:08:59 AM Scope Out: 11:26:21 AM Scope Withdrawal Time: 0 hours 14 minutes 7 seconds  Total Procedure Duration: 0 hours 17 minutes 22 seconds  Findings:                 The perianal and digital rectal examinations were                            normal.                           The terminal ileum appeared normal.                           Four sessile polyps were found in the transverse                            colon. The polyps were 3 mm in size. These polyps                            were removed with a cold snare. Resection and  retrieval were complete.                           A few small-mouthed diverticula were found in the                            sigmoid colon.                           Internal hemorrhoids were found during retroflexion.                           The exam was otherwise without abnormality. Complications:            No immediate complications. Estimated blood loss:                            Minimal. Estimated Blood Loss:     Estimated blood loss was minimal. Impression:               - The examined portion of the ileum was normal.                           - Four 3 mm polyps in the transverse colon, removed                            with a cold snare. Resected and retrieved.                           - Diverticulosis in the sigmoid colon.                           - Internal hemorrhoids.                           - The examination was otherwise normal. Recommendation:           -  Patient has a contact number available for                            emergencies. The signs and symptoms of potential                            delayed complications were discussed with the                            patient. Return to normal activities tomorrow.                            Written discharge instructions were provided to the                            patient.                           - Resume previous diet.                           -  Continue present medications.                           - Await pathology results. Remo Lipps P. Velvie Thomaston, MD 06/11/2022 11:30:57 AM This report has been signed electronically.

## 2022-06-11 NOTE — Progress Notes (Signed)
Called to room to assist during endoscopic procedure.  Patient ID and intended procedure confirmed with present staff. Received instructions for my participation in the procedure from the performing physician.  

## 2022-06-11 NOTE — Patient Instructions (Signed)
Information on polyps, diverticulosis and hemorrhoids given to you today.  Await pathology results.  Resume previous diet and medications.    YOU HAD AN ENDOSCOPIC PROCEDURE TODAY AT [facility]:   Refer to the procedure report that was given to you for any specific questions about what was found during the examination.  If the procedure report does not answer your questions, please call your gastroenterologist to clarify.  If you requested that your care partner not be given the details of your procedure findings, then the procedure report has been included in a sealed envelope for you to review at your convenience later.  YOU SHOULD EXPECT: Some feelings of bloating in the abdomen. Passage of more gas than usual.  Walking can help get rid of the air that was put into your GI tract during the procedure and reduce the bloating. If you had a lower endoscopy (such as a colonoscopy or flexible sigmoidoscopy) you may notice spotting of blood in your stool or on the toilet paper. If you underwent a bowel prep for your procedure, you may not have a normal bowel movement for a few days.  Please Note:  You might notice some irritation and congestion in your nose or some drainage.  This is from the oxygen used during your procedure.  There is no need for concern and it should clear up in a day or so.  SYMPTOMS TO REPORT IMMEDIATELY:  Following lower endoscopy (colonoscopy or flexible sigmoidoscopy):  Excessive amounts of blood in the stool  Significant tenderness or worsening of abdominal pains  Swelling of the abdomen that is new, acute  Fever of 100F or higher   For urgent or emergent issues, a gastroenterologist can be reached at any hour by calling [phone]. Do not use MyChart messaging for urgent concerns.    DIET:  We do recommend a small meal at first, but then you may proceed to your regular diet.  Drink plenty of fluids but you should avoid alcoholic beverages for  24 hours.  ACTIVITY:  You should plan to take it easy for the rest of today and you should NOT DRIVE or use heavy machinery until tomorrow (because of the sedation medicines used during the test).    FOLLOW UP: Our staff will call the number listed on your records the next business day following your procedure.  We will call around 7:15- 8:00 am to check on you and address any questions or concerns that you may have regarding the information given to you following your procedure. If we do not reach you, we will leave a message.     If any biopsies were taken you will be contacted by phone or by letter within the next 1-3 weeks.  Please call us at [phone] if you have not heard about the biopsies in 3 weeks.    SIGNATURES/CONFIDENTIALITY: You and/or your care partner have signed paperwork which will be entered into your electronic medical record.  These signatures attest to the fact that that the information above on your After Visit Summary has been reviewed and is understood.  Full responsibility of the confidentiality of this discharge information lies with you and/or your care-partner.

## 2022-06-11 NOTE — Progress Notes (Signed)
[facility] History and Physical   Primary Care Physician:  Midge Minium, MD   Reason for Procedure:   History of colon polyps  Plan:    colonoscopy     HPI: William Leon is a 52 y.o. male  here for colonoscopy surveillance - history of 7 polyps removed 04/2019 - adenomas.   He had some rectal bleeding a few months ago in the setting of constipation, thought to be due to hemorrhoids. No recurrence since then. No family history of colon cancer known. Otherwise feels well without any cardiopulmonary symptoms.   I have discussed risks / benefits of anesthesia and endoscopic procedure with William Leon and they wish to proceed with the exams as outlined today.    Past Medical History:  Diagnosis Date   Allergy    seasonal-ragweed   Arthritis    KNEE,THUMB   Gastric ulcer    H. pylori infection    Hyperlipidemia     Past Surgical History:  Procedure Laterality Date   ACHILLES TENDON SURGERY Right 01/2017   BIOPSY  02/19/2020   Procedure: BIOPSY;  Surgeon: Thornton Park, MD;  Location: [facility];  Service: Gastroenterology;;   ESOPHAGOGASTRODUODENOSCOPY (EGD) WITH PROPOFOL N/A 02/19/2020   Procedure: ESOPHAGOGASTRODUODENOSCOPY (EGD) WITH PROPOFOL;  Surgeon: Thornton Park, MD;  Location: [facility];  Service: Gastroenterology;  Laterality: N/A;   KNEE SURGERY Left    ARTHOSCOPY    Prior to Admission medications   Medication Sig Start Date End Date Taking? Authorizing Provider  Omega-3 Fatty Acids (OMEGA 3 PO) Take by mouth daily. XL TAKE 4 ONCE DAILY   Yes [provider]  rosuvastatin (CRESTOR) 10 MG tablet Take 1 tablet (10 mg total) by mouth daily. 04/30/21  Yes Midge Minium, MD    Current Outpatient Medications  Medication Sig Dispense Refill   Omega-3 Fatty Acids (OMEGA 3 PO) Take by mouth daily. XL TAKE 4 ONCE DAILY     rosuvastatin (CRESTOR) 10 MG tablet Take 1 tablet (10 mg total) by mouth daily. 90 tablet 1    Current Facility-Administered Medications  Medication Dose Route Frequency Provider Last Rate Last Admin   0.9 %  sodium chloride infusion  500 mL Intravenous Continuous Zalika Tieszen, Carlota Raspberry, MD        Allergies as of 06/11/2022   (No Known Allergies)    Family History  Problem Relation Age of Onset   Alcohol abuse Brother    Bowel Disease Brother    Colon cancer Neg Hx    Colon polyps Neg Hx    Esophageal cancer Neg Hx    Rectal cancer Neg Hx    Stomach cancer Neg Hx    Crohn's disease Neg Hx    Ulcerative colitis Neg Hx     Social History   Socioeconomic History   Marital status: Married    Spouse name: Not on file   Number of children: Not on file   Years of education: Not on file   Highest education level: Not on file  Occupational History   Not on file  Tobacco Use   Smoking status: Never   Smokeless tobacco: Never  Vaping Use   Vaping Use: Never used  Substance and Sexual Activity   Alcohol use: No    Alcohol/week: 0.0 standard drinks of alcohol   Drug use: No   Sexual activity: Not on file  Other Topics Concern   Not on file  Social History Narrative   Not on file  Social Determinants of Health   Financial Resource Strain: Not on file  Food Insecurity: Not on file  Transportation Needs: Not on file  Physical Activity: Not on file  Stress: Not on file  Social Connections: Not on file  Intimate Partner Violence: Not on file    Review of Systems: All other review of systems negative except as mentioned in the HPI.  Physical Exam: Vital signs BP 107/67   Pulse 79   Temp 98.6 F (37 C) (Temporal)   Ht '5\' 11"'$  (1.803 m)   Wt 242 lb (109.8 kg)   SpO2 97%   BMI 33.75 kg/m   General:   Alert,  Well-developed, pleasant and cooperative in NAD Lungs:  Clear throughout to auscultation.   Heart:  Regular rate and rhythm Abdomen:  Soft, nontender and nondistended.   Neuro/Psych:  Alert and cooperative. Normal mood and affect. A and O x  3  William Mango, MD [facility]

## 2022-06-11 NOTE — Progress Notes (Signed)
Pt's states no medical or surgical changes since previsit or office visit. 

## 2022-06-11 NOTE — Progress Notes (Signed)
Report given to PACU, vss 

## 2022-06-14 ENCOUNTER — Telehealth: Payer: Self-pay

## 2022-06-14 NOTE — Telephone Encounter (Signed)
  Follow up Call-     06/11/2022   10:20 AM 05/15/2020    7:42 AM  Call back number  Post procedure Call Back phone  # [phone]  Permission to leave phone message Yes Yes     Patient questions:  Do you have a fever, pain , or abdominal swelling? No. Pain Score  0 *  Have you tolerated food without any problems? Yes.    Have you been able to return to your normal activities? No.  Do you have any questions about your discharge instructions: Diet   No. Medications  No. Follow up visit  No.  Do you have questions or concerns about your Care? No.  Actions: * If pain score is 4 or above: No action needed, pain <4.

## 2022-06-18 ENCOUNTER — Ambulatory Visit: Payer: No Typology Code available for payment source

## 2022-06-18 ENCOUNTER — Ambulatory Visit (INDEPENDENT_AMBULATORY_CARE_PROVIDER_SITE_OTHER): Payer: No Typology Code available for payment source

## 2022-06-18 DIAGNOSIS — Z23 Encounter for immunization: Secondary | ICD-10-CM | POA: Diagnosis not present

## 2022-06-18 NOTE — Progress Notes (Signed)
Pt presented today for second shingles shot, administered without concern.

## 2022-08-21 IMAGING — MR MR KNEE*L* W/O CM
7 series · 40 of 40 positions shown · non-contrast
Comparison: X-ray 04/15/2021

CLINICAL DATA: Medial left knee pain for 3 weeks.  No known injury.

EXAM:
MRI OF THE LEFT KNEE WITHOUT CONTRAST
TECHNIQUE: Multiplanar, multisequence MR imaging of the knee was performed. No
intravenous contrast was administered.

[Series 3: T2 fat-sat · axial · 4.0mm · 0.50mm/px · z∈[-71,+98]mm · 5 of 35 slices shown (1 of 3)]
[im 1/35]
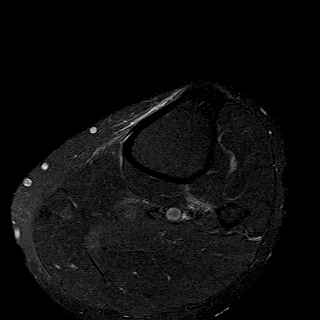
[im 9/35]
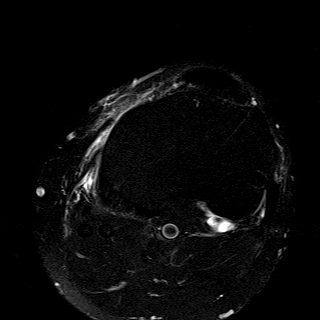
[im 18/35]
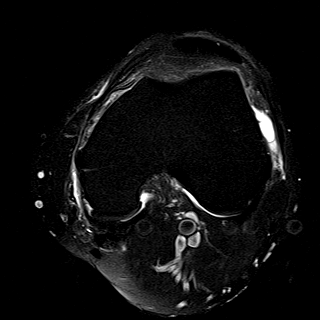
[im 26/35]
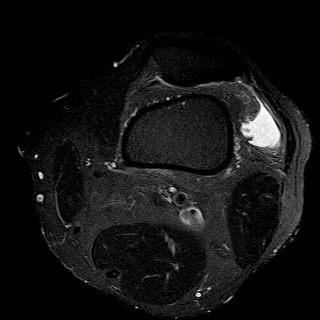
[im 35/35]
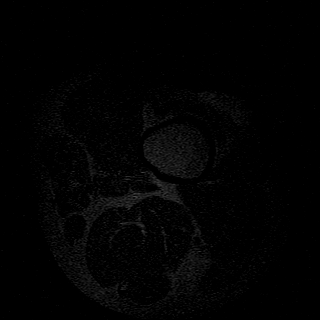

[Series 4: T1 · coronal · 4.0mm · 0.59mm/px · 6 of 33 slices shown]
[im 1/33]
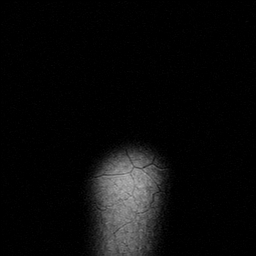
[im 7/33]
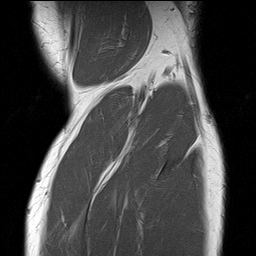
[im 13/33]
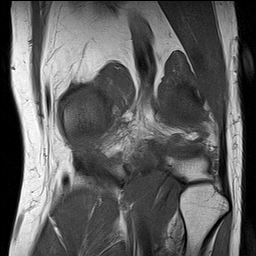
[im 20/33]
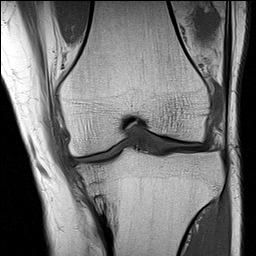
[im 26/33]
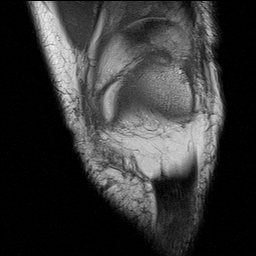
[im 33/33]
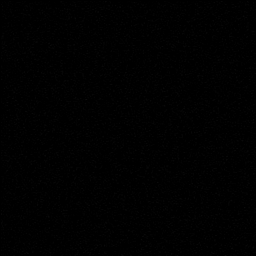

[Series 5: T2 fat-sat · coronal · 4.0mm · 0.59mm/px · 6 of 33 slices shown (2 of 3)]
[im 1/33]
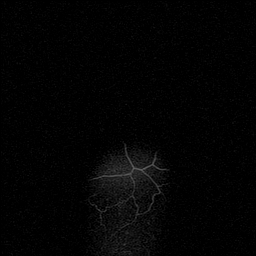
[im 7/33]
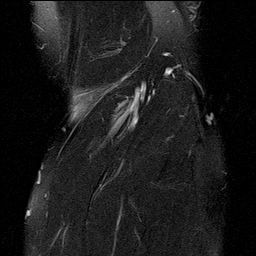
[im 13/33]
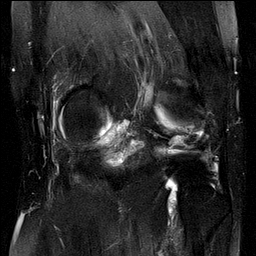
[im 20/33]
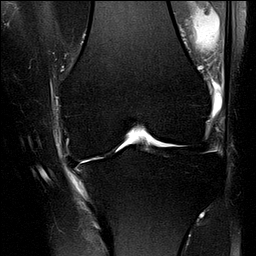
[im 26/33]
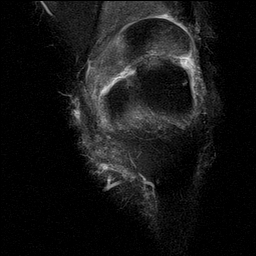
[im 33/33]
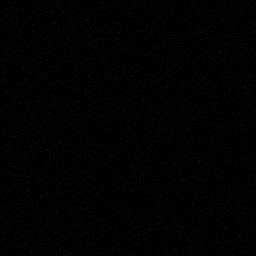

[Series 6: PD fat-sat · coronal · 4.0mm · 0.59mm/px · 6 of 33 slices shown (1 of 3)]
[im 1/33]
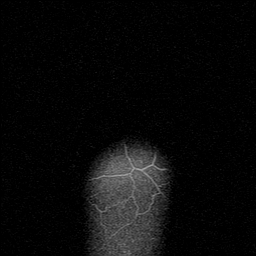
[im 7/33]
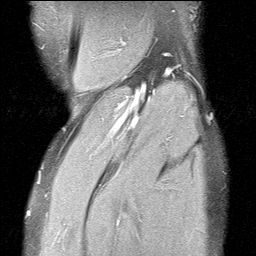
[im 13/33]
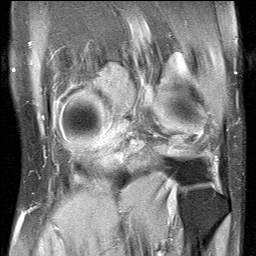
[im 20/33]
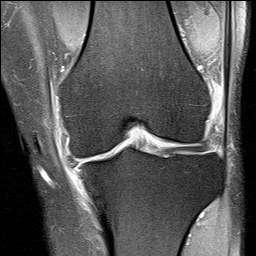
[im 26/33]
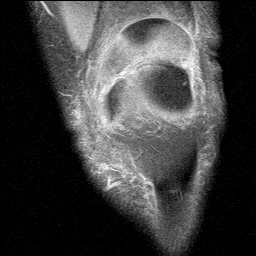
[im 33/33]
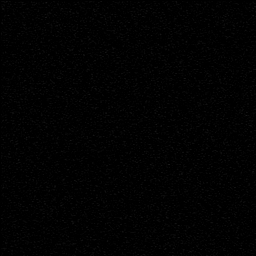

[Series 7: PD fat-sat · sagittal · 3.0mm · 0.59mm/px · 7 of 38 slices shown (2 of 3)]
[im 1/38]
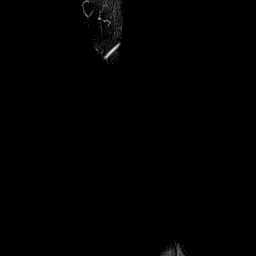
[im 7/38]
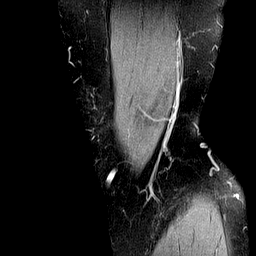
[im 13/38]
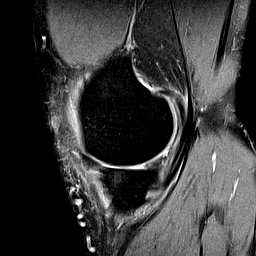
[im 19/38]
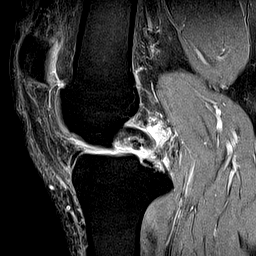
[im 25/38]
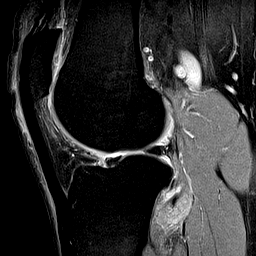
[im 31/38]
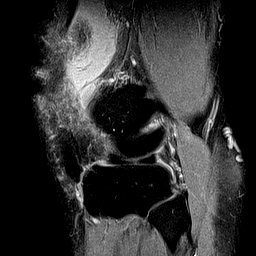
[im 38/38]
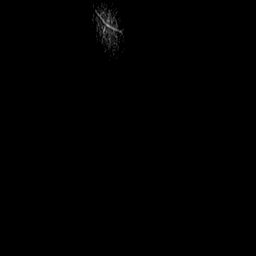

[Series 8: T2 fat-sat · sagittal · 3.0mm · 0.59mm/px · 7 of 38 slices shown (3 of 3)]
[im 1/38]
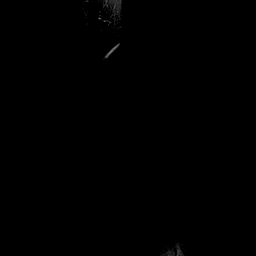
[im 7/38]
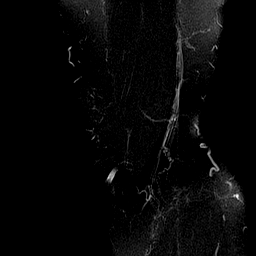
[im 13/38]
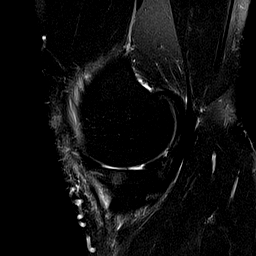
[im 19/38]
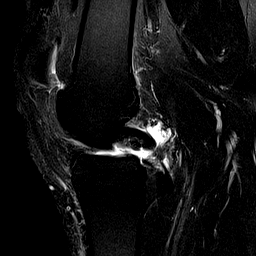
[im 25/38]
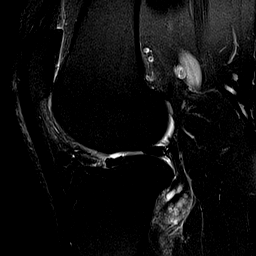
[im 31/38]
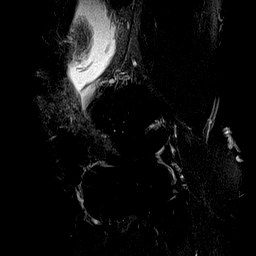
[im 38/38]
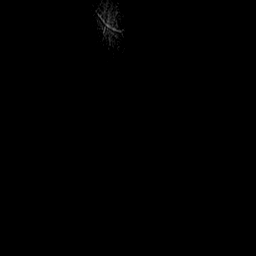

[Series 9: PD fat-sat · oblique · 2.0mm · 0.59mm/px · 3 of 19 slices shown (3 of 3)]
[im 1/19]
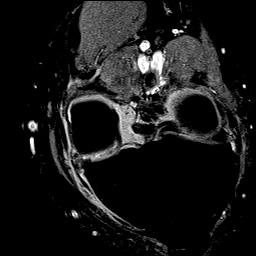
[im 10/19]
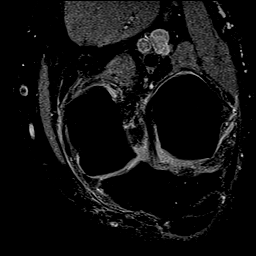
[im 19/19]
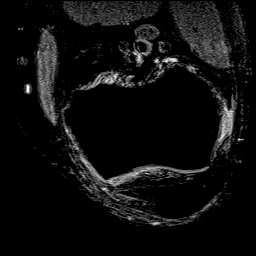

[40 of 40 positions shown; findings below may reference images not displayed]

FINDINGS: MENISCI

Medial meniscus: Irregular radial tear at the medial meniscal
posterior horn-body junction (series 3, image 24). Extrusion of the
meniscal body. With

Lateral meniscus:  Intact.

LIGAMENTS

Cruciates: Intact ACL and PCL.

Collaterals: Intact MCL periligamentous edema. Lateral collateral
ligament complex intact.

CARTILAGE

Patellofemoral: Moderate cartilage loss along the inferior portion
of the lateral patellar facet.

Medial: Moderate-advanced cartilage loss of the weight-bearing
medial compartment.

Lateral: Mild chondral thinning of the weight-bearing lateral
compartment.

MISCELLANEOUS

Joint: Small joint effusion. Mild edema within the superolateral
aspect of Hoffa's fat.

Popliteal Fossa:  No Baker's cyst. Intact popliteus tendon.

Extensor Mechanism: Intact quadriceps and patellar tendons. Mild
patellar tendinosis. Tricompartmental joint

Bones: Space narrowing with small marginal osteophytes. No acute
fracture. No dislocation. No bone marrow edema. No marrow replacing
bone lesion.

Other: Mild pes anserine bursal fluid/edema. No significant
periarticular soft tissue findings.
IMPRESSION: 1. Irregular radial tear at the medial meniscal posterior horn-body
junction.
2. Tricompartmental osteoarthritis, most pronounced in the medial
compartment.
3. Grade 1 MCL sprain.
4. Mild edema within the superolateral aspect of Hoffa's fat pad,
which can be seen in the setting of patellar tendon-lateral femoral
condyle friction syndrome.
5. Small joint effusion.
6. Mild pes anserine bursitis.

## 2022-11-09 ENCOUNTER — Other Ambulatory Visit (HOSPITAL_BASED_OUTPATIENT_CLINIC_OR_DEPARTMENT_OTHER): Payer: Self-pay

## 2022-11-09 ENCOUNTER — Other Ambulatory Visit: Payer: Self-pay | Admitting: Family Medicine

## 2022-11-10 ENCOUNTER — Encounter (INDEPENDENT_AMBULATORY_CARE_PROVIDER_SITE_OTHER): Payer: Self-pay

## 2022-11-10 ENCOUNTER — Other Ambulatory Visit (HOSPITAL_BASED_OUTPATIENT_CLINIC_OR_DEPARTMENT_OTHER): Payer: Self-pay

## 2022-11-10 MED ORDER — ROSUVASTATIN CALCIUM 10 MG PO TABS
10.0000 mg | ORAL_TABLET | Freq: Every day | ORAL | 1 refills | Status: DC
  Filled 2022-11-10: qty 90, 90d supply, fill #0

## 2022-11-25 ENCOUNTER — Encounter: Payer: Self-pay | Admitting: Family Medicine

## 2022-11-25 ENCOUNTER — Ambulatory Visit: Payer: No Typology Code available for payment source | Admitting: Family Medicine

## 2022-11-25 VITALS — BP 120/70 | HR 93 | Temp 97.9°F | Resp 18 | Ht 71.0 in | Wt 257.0 lb

## 2022-11-25 DIAGNOSIS — Z6835 Body mass index (BMI) 35.0-35.9, adult: Secondary | ICD-10-CM

## 2022-11-25 DIAGNOSIS — E785 Hyperlipidemia, unspecified: Secondary | ICD-10-CM | POA: Diagnosis not present

## 2022-11-25 DIAGNOSIS — E669 Obesity, unspecified: Secondary | ICD-10-CM | POA: Diagnosis not present

## 2022-11-25 LAB — LIPID PANEL
Cholesterol: 136 mg/dL (ref 0–200)
HDL: 34.1 mg/dL — ABNORMAL LOW (ref >39.00)
LDL Cholesterol: 87 mg/dL (ref 0–99)
NonHDL: 101.52
Total CHOL/HDL Ratio: 4
Triglycerides: 72 mg/dL (ref 0.0–149.0)
VLDL: 14.4 mg/dL (ref 0.0–40.0)

## 2022-11-25 LAB — HEPATIC FUNCTION PANEL
ALT: 31 U/L (ref 0–53)
AST: 31 U/L (ref 0–37)
Albumin: 4.3 g/dL (ref 3.5–5.2)
Alkaline Phosphatase: 52 U/L (ref 39–117)
Bilirubin, Direct: 0.2 mg/dL (ref 0.0–0.3)
Total Bilirubin: 0.7 mg/dL (ref 0.2–1.2)
Total Protein: 7.3 g/dL (ref 6.0–8.3)

## 2022-11-25 LAB — CBC WITH DIFFERENTIAL/PLATELET
Basophils Absolute: 0.1 10*3/uL (ref 0.0–0.1)
Basophils Relative: 0.9 % (ref 0.0–3.0)
Eosinophils Absolute: 0.3 10*3/uL (ref 0.0–0.7)
Eosinophils Relative: 4.2 % (ref 0.0–5.0)
HCT: 52.5 % — ABNORMAL HIGH (ref 39.0–52.0)
Hemoglobin: 17.3 g/dL — ABNORMAL HIGH (ref 13.0–17.0)
Lymphocytes Relative: 29 % (ref 12.0–46.0)
Lymphs Abs: 1.8 10*3/uL (ref 0.7–4.0)
MCHC: 32.9 g/dL (ref 30.0–36.0)
MCV: 89.2 fl (ref 78.0–100.0)
Monocytes Absolute: 0.4 10*3/uL (ref 0.1–1.0)
Monocytes Relative: 6.6 % (ref 3.0–12.0)
Neutro Abs: 3.7 10*3/uL (ref 1.4–7.7)
Neutrophils Relative %: 59.3 % (ref 43.0–77.0)
Platelets: 169 10*3/uL (ref 150.0–400.0)
RBC: 5.89 Mil/uL — ABNORMAL HIGH (ref 4.22–5.81)
RDW: 14.9 % (ref 11.5–15.5)
WBC: 6.3 10*3/uL (ref 4.0–10.5)

## 2022-11-25 LAB — BASIC METABOLIC PANEL
BUN: 15 mg/dL (ref 6–23)
CO2: 29 mEq/L (ref 19–32)
Calcium: 9.8 mg/dL (ref 8.4–10.5)
Chloride: 103 mEq/L (ref 96–112)
Creatinine, Ser: 1.47 mg/dL (ref 0.40–1.50)
GFR: 54.06 mL/min — ABNORMAL LOW (ref >60.00)
Glucose, Bld: 99 mg/dL (ref 70–99)
Potassium: 4.1 mEq/L (ref 3.5–5.1)
Sodium: 139 mEq/L (ref 135–145)

## 2022-11-25 LAB — TSH: TSH: 1.25 u[IU]/mL (ref 0.35–5.50)

## 2022-11-25 NOTE — Assessment & Plan Note (Signed)
Chronic problem.  On Crestor 10mg daily w/o difficulty.  Check labs.  Adjust meds prn  

## 2022-11-25 NOTE — Progress Notes (Signed)
   Subjective:    Patient ID: William Leon, male    DOB: 20-Nov-1970, 52 y.o.   MRN: [id number]  HPI Hyperlipidemia- chronic problem.  On Crestor 10mg  daily.  No CP, SOB, abd pain, N/V.  Obesity- pt's BMI 35.84 but he is very muscular and has very little body fat.  Continues to exercise.   Review of Systems For ROS see HPI     Objective:   Physical Exam Vitals reviewed.  Constitutional:      General: He is not in acute distress.    Appearance: Normal appearance. He is well-developed. He is not ill-appearing.  HENT:     Head: Normocephalic and atraumatic.  Eyes:     Extraocular Movements: Extraocular movements intact.     Conjunctiva/sclera: Conjunctivae normal.     Pupils: Pupils are equal, round, and reactive to light.  Neck:     Thyroid: No thyromegaly.  Cardiovascular:     Rate and Rhythm: Normal rate and regular rhythm.     Pulses: Normal pulses.     Heart sounds: Normal heart sounds. No murmur heard. Pulmonary:     Effort: Pulmonary effort is normal. No respiratory distress.     Breath sounds: Normal breath sounds.  Abdominal:     General: Bowel sounds are normal. There is no distension.     Palpations: Abdomen is soft.  Musculoskeletal:     Cervical back: Normal range of motion and neck supple.     Right lower leg: No edema.     Left lower leg: No edema.  Lymphadenopathy:     Cervical: No cervical adenopathy.  Skin:    General: Skin is warm and dry.  Neurological:     General: No focal deficit present.     Mental Status: He is alert and oriented to person, place, and time.     Cranial Nerves: No cranial nerve deficit.  Psychiatric:        Mood and Affect: Mood normal.        Behavior: Behavior normal.           Assessment & Plan:

## 2022-11-25 NOTE — Patient Instructions (Signed)
Schedule your complete physical in 6 months We'll notify you of your lab results and make any changes if needed Continue to work on healthy diet and regular exercise- you can do it! Call with any questions or concerns Stay Safe!  Stay Healthy! William Leon [employer]!!!

## 2022-11-25 NOTE — Assessment & Plan Note (Signed)
Ongoing issue for pt.  In his case, his BMI is misleading as he has muscle mass and is not truly obese.  Encouraged continued efforts at healthy diet and regular exercise.  Will follow.

## 2022-11-26 ENCOUNTER — Telehealth: Payer: Self-pay

## 2022-11-26 NOTE — Telephone Encounter (Signed)
-----   Message from Neena Rhymes sent at 11/26/2022  7:22 AM EDT ----- Labs are stable and look good!  No changes at this time

## 2022-11-26 NOTE — Telephone Encounter (Signed)
Informed pt of lab results  

## 2022-12-29 ENCOUNTER — Other Ambulatory Visit (HOSPITAL_BASED_OUTPATIENT_CLINIC_OR_DEPARTMENT_OTHER): Payer: Self-pay

## 2022-12-29 ENCOUNTER — Encounter: Payer: Self-pay | Admitting: Family Medicine

## 2022-12-29 ENCOUNTER — Ambulatory Visit: Payer: No Typology Code available for payment source | Admitting: Family Medicine

## 2022-12-29 VITALS — BP 124/80 | HR 76 | Temp 97.8°F | Ht 71.0 in | Wt 248.8 lb

## 2022-12-29 DIAGNOSIS — R059 Cough, unspecified: Secondary | ICD-10-CM | POA: Diagnosis not present

## 2022-12-29 DIAGNOSIS — U071 COVID-19: Secondary | ICD-10-CM | POA: Diagnosis not present

## 2022-12-29 LAB — POC COVID19 BINAXNOW: SARS Coronavirus 2 Ag: POSITIVE — AB

## 2022-12-29 LAB — POCT INFLUENZA A/B
Influenza A, POC: NEGATIVE
Influenza B, POC: NEGATIVE

## 2022-12-29 MED ORDER — GUAIFENESIN-CODEINE 100-10 MG/5ML PO SYRP
10.0000 mL | ORAL_SOLUTION | Freq: Three times a day (TID) | ORAL | 0 refills | Status: DC | PRN
  Filled 2022-12-29: qty 120, 4d supply, fill #0

## 2022-12-29 MED ORDER — NIRMATRELVIR/RITONAVIR (PAXLOVID) TABLET (RENAL DOSING)
2.0000 | ORAL_TABLET | Freq: Two times a day (BID) | ORAL | 0 refills | Status: AC
  Filled 2022-12-29: qty 20, 5d supply, fill #0

## 2022-12-29 NOTE — Patient Instructions (Signed)
Follow up as needed or as scheduled START the Paxlovid as directed HOLD the Rosuvastatin (Crestor) while on the Paxlovid and restart when you are done USE the cough syrup as needed Drink LOTS of fluids REST! Call with any questions or concerns Hang in there!!

## 2022-12-29 NOTE — Progress Notes (Signed)
Subjective:    Patient ID: William Leon, male    DOB: 10-Aug-1970, 52 y.o.   MRN: [id number]  HPI Cough- sxs started 2-3 days ago.  Possible sick exposure.  No fevers.  + body aches.  Denies HA.  No sore throat.  Cough is wet but not productive.  'it feels like COVID'.   Review of Systems For ROS see HPI     Objective:   Physical Exam Vitals reviewed.  Constitutional:      General: He is not in acute distress.    Appearance: Normal appearance. He is not ill-appearing.  HENT:     Head: Normocephalic and atraumatic.     Nose: No congestion or rhinorrhea.     Comments: No TTP over frontal or maxillary sinuses    Mouth/Throat:     Mouth: Mucous membranes are moist.     Pharynx: No oropharyngeal exudate or posterior oropharyngeal erythema.  Eyes:     Extraocular Movements: Extraocular movements intact.     Conjunctiva/sclera: Conjunctivae normal.  Cardiovascular:     Rate and Rhythm: Normal rate and regular rhythm.  Pulmonary:     Effort: Pulmonary effort is normal. No respiratory distress.     Breath sounds: No wheezing or rhonchi.  Musculoskeletal:     Cervical back: Neck supple.  Lymphadenopathy:     Cervical: No cervical adenopathy.  Skin:    General: Skin is warm and dry.  Neurological:     General: No focal deficit present.     Mental Status: He is alert and oriented to person, place, and time.  Psychiatric:        Mood and Affect: Mood normal.        Behavior: Behavior normal.        Thought Content: Thought content normal.           Assessment & Plan:   COVID- new.  Pt's sxs are consistent w/ dx and rapid test confirms.  Start Paxlovid at renal dose due to GFR<60.  Pt advised to hold Crestor.  Cough syrup prn.  Reviewed supportive care and red flags that should prompt return.  Pt expressed understanding and is in agreement w/ plan.

## 2023-05-31 ENCOUNTER — Ambulatory Visit (INDEPENDENT_AMBULATORY_CARE_PROVIDER_SITE_OTHER): Payer: No Typology Code available for payment source | Admitting: Family Medicine

## 2023-05-31 ENCOUNTER — Other Ambulatory Visit (HOSPITAL_COMMUNITY)
Admission: RE | Admit: 2023-05-31 | Discharge: 2023-05-31 | Disposition: A | Discharge status: Home / Self Care | Payer: No Typology Code available for payment source | Source: Ambulatory Visit | Attending: Family Medicine | Admitting: Family Medicine

## 2023-05-31 VITALS — BP 122/78 | HR 78 | Temp 98.0°F | Ht 71.0 in | Wt 259.2 lb

## 2023-05-31 DIAGNOSIS — E669 Obesity, unspecified: Secondary | ICD-10-CM

## 2023-05-31 DIAGNOSIS — Z125 Encounter for screening for malignant neoplasm of prostate: Secondary | ICD-10-CM

## 2023-05-31 DIAGNOSIS — E785 Hyperlipidemia, unspecified: Secondary | ICD-10-CM | POA: Diagnosis not present

## 2023-05-31 DIAGNOSIS — Z202 Contact with and (suspected) exposure to infections with a predominantly sexual mode of transmission: Secondary | ICD-10-CM

## 2023-05-31 DIAGNOSIS — N50812 Left testicular pain: Secondary | ICD-10-CM

## 2023-05-31 DIAGNOSIS — Z0000 Encounter for general adult medical examination without abnormal findings: Secondary | ICD-10-CM | POA: Diagnosis not present

## 2023-05-31 LAB — BASIC METABOLIC PANEL
BUN: 18 mg/dL (ref 6–23)
CO2: 28 meq/L (ref 19–32)
Calcium: 9.4 mg/dL (ref 8.4–10.5)
Chloride: 103 meq/L (ref 96–112)
Creatinine, Ser: 1.51 mg/dL — ABNORMAL HIGH (ref 0.40–1.50)
GFR: 52.16 mL/min — ABNORMAL LOW (ref >60.00)
Glucose, Bld: 95 mg/dL (ref 70–99)
Potassium: 4.3 meq/L (ref 3.5–5.1)
Sodium: 140 meq/L (ref 135–145)

## 2023-05-31 LAB — PSA: PSA: 0.28 ng/mL (ref 0.10–4.00)

## 2023-05-31 LAB — HEPATIC FUNCTION PANEL
ALT: 37 U/L (ref 0–53)
AST: 36 U/L (ref 0–37)
Albumin: 4.5 g/dL (ref 3.5–5.2)
Alkaline Phosphatase: 58 U/L (ref 39–117)
Bilirubin, Direct: 0.1 mg/dL (ref 0.0–0.3)
Total Bilirubin: 0.8 mg/dL (ref 0.2–1.2)
Total Protein: 7.6 g/dL (ref 6.0–8.3)

## 2023-05-31 LAB — URINE CYTOLOGY ANCILLARY ONLY
Chlamydia: NEGATIVE
Comment: NEGATIVE
Comment: NORMAL
Neisseria Gonorrhea: NEGATIVE

## 2023-05-31 LAB — LIPID PANEL
Cholesterol: 210 mg/dL — ABNORMAL HIGH (ref 0–200)
HDL: 34.9 mg/dL — ABNORMAL LOW (ref >39.00)
LDL Cholesterol: 148 mg/dL — ABNORMAL HIGH (ref 0–99)
NonHDL: 174.67
Total CHOL/HDL Ratio: 6
Triglycerides: 135 mg/dL (ref 0.0–149.0)
VLDL: 27 mg/dL (ref 0.0–40.0)

## 2023-05-31 LAB — CBC WITH DIFFERENTIAL/PLATELET
Basophils Absolute: 0 10*3/uL (ref 0.0–0.1)
Basophils Relative: 0.7 % (ref 0.0–3.0)
Eosinophils Absolute: 0.2 10*3/uL (ref 0.0–0.7)
Eosinophils Relative: 2.7 % (ref 0.0–5.0)
HCT: 53.9 % — ABNORMAL HIGH (ref 39.0–52.0)
Hemoglobin: 17.9 g/dL — ABNORMAL HIGH (ref 13.0–17.0)
Lymphocytes Relative: 30.5 % (ref 12.0–46.0)
Lymphs Abs: 1.7 10*3/uL (ref 0.7–4.0)
MCHC: 33.2 g/dL (ref 30.0–36.0)
MCV: 89.4 fL (ref 78.0–100.0)
Monocytes Absolute: 0.5 10*3/uL (ref 0.1–1.0)
Monocytes Relative: 8 % (ref 3.0–12.0)
Neutro Abs: 3.3 10*3/uL (ref 1.4–7.7)
Neutrophils Relative %: 58.1 % (ref 43.0–77.0)
Platelets: 161 10*3/uL (ref 150.0–400.0)
RBC: 6.03 Mil/uL — ABNORMAL HIGH (ref 4.22–5.81)
RDW: 14.6 % (ref 11.5–15.5)
WBC: 5.7 10*3/uL (ref 4.0–10.5)

## 2023-05-31 LAB — TSH: TSH: 2.29 u[IU]/mL (ref 0.35–5.50)

## 2023-05-31 LAB — HEMOGLOBIN A1C: Hgb A1c MFr Bld: 6 % (ref 4.6–6.5)

## 2023-05-31 NOTE — Progress Notes (Signed)
   Subjective:    Patient ID: William Leon, male    DOB: 1970/09/29, 53 y.o.   MRN: [id number]  HPI CPE- UTD colonoscopy, Tdap  Patient Care Team    Relationship Specialty Notifications Start End  Sheliah Hatch, MD PCP - General   03/25/10     Health Maintenance  Topic Date Due   Zoster Vaccines- Shingrix (2 of 2) 08/13/2022   INFLUENZA VACCINE  11/11/2022   COVID-19 Vaccine (4 - 2024-25 season) 12/12/2022   Colonoscopy  06/11/2027   DTaP/Tdap/Td (3 - Td or Tdap) 11/25/2031   Hepatitis C Screening  Completed   HIV Screening  Completed   HPV VACCINES  Aged Out     Review of Systems Patient reports no vision/hearing changes, anorexia, fever ,adenopathy, persistant/recurrent hoarseness, swallowing issues, chest pain, palpitations, edema, persistant/recurrent cough, hemoptysis, dyspnea (rest,exertional, paroxysmal nocturnal), gastrointestinal  bleeding (melena, rectal bleeding), abdominal pain, excessive heart burn, GU symptoms (dysuria, hematuria, voiding/incontinence issues) syncope, focal weakness, memory loss, numbness & tingling, skin/hair/nail changes, depression, anxiety, abnormal bruising/bleeding.   + 10 lb weight gain + bilateral heel pain + L testicular discomfort- hx of varicocele    Objective:   Physical Exam General Appearance:    Alert, cooperative, no distress, appears stated age  Head:    Normocephalic, without obvious abnormality, atraumatic  Eyes:    PERRL, conjunctiva/corneas clear, EOM's intact both eyes       Ears:    Normal TM's and external ear canals, both ears  Nose:   Nares normal, septum midline, mucosa normal, no drainage   or sinus tenderness  Throat:   Lips, mucosa, and tongue normal; teeth and gums normal  Neck:   Supple, symmetrical, trachea midline, no adenopathy;       thyroid:  No enlargement/tenderness/nodules  Back:     Symmetric, no curvature, ROM normal, no CVA tenderness  Lungs:     Clear to auscultation bilaterally, respirations  unlabored  Chest wall:    No tenderness or deformity  Heart:    Regular rate and rhythm, S1 and S2 normal, no murmur, rub   or gallop  Abdomen:     Soft, non-tender, bowel sounds active all four quadrants,    no masses, no organomegaly  Genitalia:    deferred  Rectal:    Extremities:   Extremities normal, atraumatic, no cyanosis or edema  Pulses:   2+ and symmetric all extremities  Skin:   Skin color, texture, turgor normal, no rashes or lesions  Lymph nodes:   Cervical, supraclavicular, and axillary nodes normal  Neurologic:   CNII-XII intact. Normal strength, sensation and reflexes      throughout          Assessment & Plan:

## 2023-05-31 NOTE — Assessment & Plan Note (Signed)
Chronic problem.  Stopped Crestor due to myalgias.  Is willing to try something else based on lab results but does not want to restart Crestor.

## 2023-05-31 NOTE — Patient Instructions (Signed)
Follow up in 6 months to recheck cholesterol We'll notify you of your lab results and make any changes if needed Continue to work on healthy diet and regular exercise- you can do it! We'll call you to schedule your ultrasound Call with any questions or concerns Stay Safe!  Stay Healthy!

## 2023-05-31 NOTE — Assessment & Plan Note (Signed)
Pt has hx of varicocele.  Reports discomfort when bathing.  Will repeat US

## 2023-05-31 NOTE — Assessment & Plan Note (Signed)
Deteriorated.  Pt has gained 10 lbs since last visit

## 2023-05-31 NOTE — Assessment & Plan Note (Signed)
 Pt's PE WNL w/ exception of BMI.  UTD on colonoscopy, Tdap.  Check labs.  Anticipatory guidance provided.

## 2023-06-02 ENCOUNTER — Telehealth: Payer: Self-pay

## 2023-06-02 ENCOUNTER — Encounter: Payer: Self-pay | Admitting: Family Medicine

## 2023-06-02 DIAGNOSIS — Z202 Contact with and (suspected) exposure to infections with a predominantly sexual mode of transmission: Secondary | ICD-10-CM

## 2023-06-02 LAB — HEPATITIS C ANTIBODY: Hepatitis C Ab: NONREACTIVE

## 2023-06-02 LAB — RPR: RPR Ser Ql: NONREACTIVE

## 2023-06-02 LAB — HSV(HERPES SIMPLEX VRS) I + II AB-IGG
HSV 1 IGG,TYPE SPECIFIC AB: 36.5 {index} — ABNORMAL HIGH
HSV 2 IGG,TYPE SPECIFIC AB: 0.97 {index} — ABNORMAL HIGH

## 2023-06-02 LAB — HIV ANTIBODY (ROUTINE TESTING W REFLEX): HIV 1&2 Ab, 4th Generation: NONREACTIVE

## 2023-06-02 NOTE — Telephone Encounter (Signed)
-----   Message from Neena Rhymes sent at 06/02/2023  2:22 PM EST ----- Labs look good w/ a few exceptions-  Your total cholesterol and LDL (bad cholesterol) have both increased since stopping your Crestor.  Based on this, we should try Atorvastatin 20mg  nightly (#90, 1 refill) while working on healthy diet and regular exercise  Your Creatinine and GFR (markers of kidney function) are overall stable  Hemoglobin (blood count) is mildly elevated but stable.  Increase your water intake.  All STD testing is negative w/ the exception of herpes.  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  Your labs are equivocal for HSV 2- the virus that causes genital herpes.  This means we should repeat this lab at your next visit.

## 2023-06-03 ENCOUNTER — Ambulatory Visit (HOSPITAL_BASED_OUTPATIENT_CLINIC_OR_DEPARTMENT_OTHER): Admission: RE | Admit: 2023-06-03 | Payer: No Typology Code available for payment source | Source: Ambulatory Visit

## 2023-06-03 NOTE — Telephone Encounter (Signed)
Tried to call pt about lab results, Left VM to return call to clinic.

## 2023-06-07 ENCOUNTER — Other Ambulatory Visit (HOSPITAL_BASED_OUTPATIENT_CLINIC_OR_DEPARTMENT_OTHER): Payer: Self-pay

## 2023-06-07 ENCOUNTER — Other Ambulatory Visit: Payer: Self-pay

## 2023-06-07 MED ORDER — ATORVASTATIN CALCIUM 20 MG PO TABS
20.0000 mg | ORAL_TABLET | Freq: Every day | ORAL | 1 refills | Status: DC
  Filled 2023-06-07: qty 90, 90d supply, fill #0

## 2023-06-07 NOTE — Telephone Encounter (Signed)
 Pt wants to know if the HSV can be repeated sooner. Pt is concerned he would've gotten this from intimate contact with someone and notes he has not been with any new sexual partners   Please advise

## 2023-06-07 NOTE — Addendum Note (Signed)
 Addended by: Eldred Manges on: 06/07/2023 01:13 PM   Modules accepted: Orders

## 2023-06-07 NOTE — Telephone Encounter (Signed)
 Patient has been informed and Lab has been set and preordered

## 2023-06-07 NOTE — Telephone Encounter (Signed)
 We can certainly schedule a lab visit for him to come in and have the HSV labs repeated.  I would recommend waiting a few weeks so that hopefully the test will not be in the equivocal range again (HSV, dx possible exposure to STD)

## 2023-06-20 ENCOUNTER — Encounter: Payer: Self-pay | Admitting: Family Medicine

## 2023-06-20 ENCOUNTER — Other Ambulatory Visit: Payer: No Typology Code available for payment source

## 2023-06-26 ENCOUNTER — Ambulatory Visit
Admission: EM | Admit: 2023-06-26 | Discharge: 2023-06-26 | Disposition: A | Discharge status: Home / Self Care | Attending: Family Medicine | Admitting: Family Medicine

## 2023-06-26 ENCOUNTER — Encounter: Payer: Self-pay | Admitting: Emergency Medicine

## 2023-06-26 DIAGNOSIS — J069 Acute upper respiratory infection, unspecified: Secondary | ICD-10-CM | POA: Diagnosis not present

## 2023-06-26 LAB — POCT INFLUENZA A/B
Influenza A, POC: NEGATIVE
Influenza B, POC: NEGATIVE

## 2023-06-26 LAB — POC SARS CORONAVIRUS 2 AG -  ED: SARS Coronavirus 2 Ag: NEGATIVE

## 2023-06-26 MED ORDER — DM-GUAIFENESIN ER 30-600 MG PO TB12: 1.0000 | ORAL_TABLET | Freq: Two times a day (BID) | ORAL | 0 refills | Status: DC

## 2023-06-26 MED ORDER — PREDNISONE 50 MG PO TABS: ORAL_TABLET | ORAL | 0 refills | Status: DC

## 2023-06-26 NOTE — Discharge Instructions (Addendum)
 Drink lots of fluids COVID and flu testing are both negative Take Mucinex DM for the cough.  This will help loosen the mucus Take prednisone once a day for 5 days See your doctor if not improving by next week

## 2023-06-26 NOTE — ED Provider Notes (Signed)
 [facility]    CSN: [id number] Arrival date & time: 06/26/23  1005      History   Chief Complaint Chief Complaint  Patient presents with   Cough    HPI William Leon is a 53 y.o. male.   Patient's had a runny nose cough and congestion 4 days.  He states that he feels like his chest was congestion "but I cannot cough it out".  No fever or chills.  Some backache.  Some fatigue.  Concern for exposure to flu or COVID at his job. No underlying lung disease or asthma.  Non-smoker.    Past Medical History:  Diagnosis Date   Allergy    seasonal-ragweed   Arthritis    KNEE,THUMB   Gastric ulcer    H. pylori infection    Hyperlipidemia     Patient Active Problem List   Diagnosis Date Noted   H. pylori duodenitis 02/27/2020   Hypocalcemia 02/19/2020   Anemia    Gastric ulcer with hemorrhage    Melena 02/18/2020   Hyperlipidemia    Azotemia    Obesity (BMI 30-39.9) 04/11/2019   Plantar fasciitis 10/31/2017   Achilles tendon rupture, right, subsequent encounter 02/07/2017   Elevated serum creatinine 05/22/2014   Physical exam, annual 11/04/2011   Pain in left testicle 10/22/2009   LENTIGO 10/01/2009    Past Surgical History:  Procedure Laterality Date   ACHILLES TENDON SURGERY Right 01/2017   BIOPSY  02/19/2020   Procedure: BIOPSY;  Surgeon: Tressia Danas, MD;  Location: [facility];  Service: Gastroenterology;;   ESOPHAGOGASTRODUODENOSCOPY (EGD) WITH PROPOFOL N/A 02/19/2020   Procedure: ESOPHAGOGASTRODUODENOSCOPY (EGD) WITH PROPOFOL;  Surgeon: Tressia Danas, MD;  Location: [facility];  Service: Gastroenterology;  Laterality: N/A;   KNEE SURGERY Left    ARTHOSCOPY       Home Medications    Prior to Admission medications   Medication Sig Start Date End Date Taking? Authorizing Provider  atorvastatin (LIPITOR) 20 MG tablet Take 1 tablet (20 mg total) by mouth daily. 06/07/23  Yes Sheliah Hatch, MD  dextromethorphan-guaiFENesin  [facility] DM) 30-600 MG 12hr tablet Take 1 tablet by mouth 2 (two) times daily. 06/26/23  Yes Eustace Moore, MD  Omega-3 Fatty Acids (OMEGA 3 PO) Take by mouth daily. XL TAKE 4 ONCE DAILY   Yes [provider]  predniSONE (DELTASONE) 50 MG tablet Take once a day for 5 days.  Take with food 06/26/23  Yes Eustace Moore, MD    Family History Family History  Problem Relation Age of Onset   Alcohol abuse Brother    Bowel Disease Brother    Colon cancer Neg Hx    Colon polyps Neg Hx    Esophageal cancer Neg Hx    Rectal cancer Neg Hx    Stomach cancer Neg Hx    Crohn's disease Neg Hx    Ulcerative colitis Neg Hx     Social History Social History   Tobacco Use   Smoking status: Never   Smokeless tobacco: Never  Vaping Use   Vaping status: Never Used  Substance Use Topics   Alcohol use: No    Alcohol/week: 0.0 standard drinks of alcohol   Drug use: No     Allergies   Patient has no known allergies.   Review of Systems Review of Systems   Physical Exam Triage Vital Signs ED Triage Vitals  Encounter Vitals Group     BP 06/26/23 1013 (!) 143/89  Systolic BP Percentile --      Diastolic BP Percentile --      Pulse Rate 06/26/23 1013 78     Resp 06/26/23 1013 18     Temp 06/26/23 1013 98.7 F (37.1 C)     Temp Source 06/26/23 1013 Oral     SpO2 06/26/23 1013 93 %     Weight 06/26/23 1015 245 lb (111.1 kg)     Height 06/26/23 1015 5\' 11"  (1.803 m)     Head Circumference --      Peak Flow --      Pain Score 06/26/23 1015 5     Pain Loc --      Pain Education --      Exclude from Growth Chart --    No data found.  Updated Vital Signs BP (!) 143/89 (BP Location: Right Arm)   Pulse 78   Temp 98.7 F (37.1 C) (Oral)   Resp 18   Ht 5\' 11"  (1.803 m)   Wt 111.1 kg   SpO2 95%   BMI 34.17 kg/m     Physical Exam Constitutional:      General: He is not in acute distress.    Appearance: He is well-developed. He is ill-appearing.     Comments:  Patient is muscular and stocky.  Not obese  HENT:     Head: Normocephalic and atraumatic.     Right Ear: Tympanic membrane normal.     Left Ear: Tympanic membrane normal.     Nose: Nose normal. No congestion.     Mouth/Throat:     Mouth: Mucous membranes are moist.     Pharynx: No posterior oropharyngeal erythema.  Eyes:     Conjunctiva/sclera: Conjunctivae normal.     Pupils: Pupils are equal, round, and reactive to light.  Cardiovascular:     Rate and Rhythm: Normal rate and regular rhythm.     Heart sounds: Normal heart sounds.  Pulmonary:     Effort: Pulmonary effort is normal. No respiratory distress.     Breath sounds: Normal breath sounds.  Abdominal:     General: There is no distension.     Palpations: Abdomen is soft.  Musculoskeletal:        General: Normal range of motion.     Cervical back: Normal range of motion and neck supple.  Lymphadenopathy:     Cervical: No cervical adenopathy.  Skin:    General: Skin is warm and dry.  Neurological:     Mental Status: He is alert.    See HPI  UC Treatments / Results  Labs (all labs ordered are listed, but only abnormal results are displayed) Labs Reviewed  POC SARS CORONAVIRUS 2 AG -  ED  POCT INFLUENZA A/B    EKG   Radiology No results found.  Procedures Procedures (including critical care time)  Medications Ordered in UC Medications - No data to display  Initial Impression / Assessment and Plan / UC Course  I have reviewed the triage vital signs and the nursing notes.  Pertinent labs & imaging results that were available during my care of the patient were reviewed by me and considered in my medical decision making (see chart for details).     Viral illness discussed Final Clinical Impressions(s) / UC Diagnoses   Final diagnoses:  Viral URI with cough     Discharge Instructions      Drink lots of fluids COVID and flu testing are both negative Take Mucinex DM  for the cough.  This will help  loosen the mucus Take prednisone once a day for 5 days See your doctor if not improving by next week   ED Prescriptions     Medication Sig Dispense Auth. Provider   predniSONE (DELTASONE) 50 MG tablet Take once a day for 5 days.  Take with food 5 tablet Eustace Moore, MD   dextromethorphan-guaiFENesin [facility] DM) 30-600 MG 12hr tablet Take 1 tablet by mouth 2 (two) times daily. 20 tablet Eustace Moore, MD      PDMP not reviewed this encounter.   Eustace Moore, MD 06/26/23 [phone]

## 2023-06-26 NOTE — ED Triage Notes (Signed)
 Patient c/o congestion, flu like sx's x 4 days, cough x 3 days, productive started yesterday.  Denies any fever.  Denies getting his flu shot.  Patient has taken Delsym and Claritin.

## 2023-07-01 ENCOUNTER — Ambulatory Visit (HOSPITAL_BASED_OUTPATIENT_CLINIC_OR_DEPARTMENT_OTHER): Payer: No Typology Code available for payment source

## 2023-08-10 ENCOUNTER — Telehealth: Payer: Self-pay

## 2023-08-10 DIAGNOSIS — E669 Obesity, unspecified: Secondary | ICD-10-CM

## 2023-08-10 NOTE — Telephone Encounter (Signed)
 Referral placed.

## 2023-08-10 NOTE — Telephone Encounter (Signed)
 Would he need to be seen by you for this sleep study?   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

## 2023-08-10 NOTE — Telephone Encounter (Signed)
 Patient would like a referral placed please

## 2023-08-10 NOTE — Telephone Encounter (Signed)
 That is a pretty quick turn around time for a sleep study to be done.  Typically I refer to [facility] (usually the fastest) or Pulmonary for a complete evaluation.  I am happy to place the referral if this is how he wants to proceed.

## 2023-08-10 NOTE — Telephone Encounter (Signed)
 Copied from CRM [id number]. 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 He would like to know if Dr.Tabori can place an order or what he needs to do to complete this exam.

## 2023-08-11 NOTE — Telephone Encounter (Signed)
 Called patient to inform him of the referral being sent in and also provided him with the name and phone number of the sleep center as well. I did inform patient that it can take 1-2 weeks to hear from someone, if he has not heard in a week to give them a call.

## 2023-08-16 ENCOUNTER — Telehealth: Payer: Self-pay | Admitting: Family Medicine

## 2023-08-16 NOTE — Telephone Encounter (Signed)
 Copied from CRM [id number]. 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 They recommended [facility] at [facility] but patient is open to going anywhere where they can get him in sooner to be able to get the study done.  Patient would like a call back when referral is sent so he can call as soon as possible to schedule.

## 2023-08-17 NOTE — Telephone Encounter (Signed)
 Called patient and left him a message requesting he call me at [phone] regarding referral for sleep study.

## 2023-09-23 ENCOUNTER — Ambulatory Visit: Admitting: Student in an Organized Health Care Education/Training Program

## 2023-09-23 ENCOUNTER — Encounter: Payer: Self-pay | Admitting: Student in an Organized Health Care Education/Training Program

## 2023-09-23 ENCOUNTER — Ambulatory Visit: Payer: Self-pay

## 2023-09-23 VITALS — BP 140/81 | HR 82 | Wt 259.0 lb

## 2023-09-23 DIAGNOSIS — H8110 Benign paroxysmal vertigo, unspecified ear: Secondary | ICD-10-CM | POA: Diagnosis not present

## 2023-09-23 DIAGNOSIS — E785 Hyperlipidemia, unspecified: Secondary | ICD-10-CM

## 2023-09-23 NOTE — Assessment & Plan Note (Signed)
 Symptoms and exam today are most consistent with benign positional vertigo.  No symptoms to suggest acute vestibular syndrome.  I do not think this is presyncope.  Excellent health otherwise with no exertional dyspnea.  No symptoms of orthostatic hypotension.  I was not able to elicit the vertigo on Dix-Hallpike maneuvers in the office.  I gave him some printed information about vertigo.  His wife has vertigo as well, so he is familiar with some of the treatment positionings.  I gave him information about the Epley maneuver.  If symptoms are continuing to bother him, we can refer him to vestibular physical therapy.

## 2023-09-23 NOTE — Assessment & Plan Note (Addendum)
 The 10-year ASCVD risk score (Arnett DK, et al., 2019) is: 8.4%  Patient asked for recommendations about whether he should take a cholesterol medication.  ASCVD risk is intermediate based on last lipids.  He is otherwise very healthy with no history of ischemic event.  He has arthralgia in his left knee when he takes statin medications which are bothersome to him.  I advised him that statins at this point are optional.  I think in the next few years his ASCVD risk is going to increase mostly because of age, and there will be a point when a statin for primary prevention of ischemic vascular disease will become more valuable.

## 2023-09-23 NOTE — Telephone Encounter (Signed)
 FYI Only or Action Required?: FYI only for provider  Patient was last seen in primary care on 05/31/2023 by Jess Morita, MD. Called Nurse Triage reporting Dizziness. Symptoms began yesterday. Interventions attempted: Rest, hydration, or home remedies. Symptoms are: unchanged.  Triage Disposition: See Physician Within 24 Hours  Patient/caregiver understands and will follow disposition?: Yes   Copied from CRM [id number]. Topic: Clinical - Red Word Triage >> Sep 23, 2023 10:04 AM [facility] wrote: Red Word that prompted transfer to Nurse Triage: Beginning yesterday evening, patient felt off balance and did not fall but stumbled and felt something was off.   Still feeling off balanced today on and off Wants to know if he needs testing or has some sort of infection  States this could be due to stress-blood pressure, cholesterol or something? Reason for Disposition  [1] MODERATE dizziness (e.g., vertigo; feels very unsteady, interferes with normal activities) AND [2] has NOT been evaluated by doctor (or NP/PA) for this  Answer Assessment - Initial Assessment Questions 1. DESCRIPTION: Describe your dizziness.     Not quite dizzy, but feels off balance 2. VERTIGO: Do you feel like either you or the room is spinning or tilting?      It feels like the earth is moving under my feet 3. LIGHTHEADED: Do you feel lightheaded? (e.g., somewhat faint, woozy, weak upon standing)     denies 4. SEVERITY: How bad is it?  Can you walk?   - MILD: Feels slightly dizzy and unsteady, but is walking normally.   - MODERATE: Feels unsteady when walking, but not falling; interferes with normal activities (e.g., school, work).   - SEVERE: Unable to walk without falling, or requires assistance to walk without falling.     moderate 5. ONSET:  When did the dizziness begin?     One day ago 6. AGGRAVATING FACTORS: Does anything make it worse? (e.g., standing, change in head position)     Unknown,  just comes and goes 7. CAUSE: What do you think is causing the dizziness?     unknown 8. RECURRENT SYMPTOM: Have you had dizziness before? If Yes, ask: When was the last time? What happened that time?     Denies any other episodes 9. OTHER SYMPTOMS: Do you have any other symptoms? (e.g., headache, weakness, numbness, vomiting, earache)     denies  Protocols used: Dizziness - Vertigo-A-AH

## 2023-09-23 NOTE — Progress Notes (Signed)
 Acute Office Visit  Subjective:     Patient ID: William Leon, male    DOB: 13-Nov-1970, 53 y.o.   MRN: [id number]  Chief Complaint  Patient presents with   Dizziness    From triage notes today:  Beginning yesterday evening, patient felt off balance and did not fall but stumbled and felt something was off.  Still feeling off balanced today on and off Wants to know if he needs testing or has some sort of infection States this could be due to stress-blood pressure, cholesterol or something?     HPI  53 year old person here for evaluation of a sensation of unsteadiness.  He is careful to say it does not seem like dizziness.  It happened while he was walking yesterday to dinner.  Felt a sensation like the ground was moving, little lightheaded.  The sensation lasted only few minutes.  It occurred again when he was walking back to his car.  And then occurred again this morning while he was getting ready for work.  No shortness of breath or chest pain.  This sensation only lasts a few minutes each time.  No headache or tinnitus.  He returned back to normal in between episodes, no residual symptoms.  No recent upper respiratory tract infections.  Very healthy person, exercises regularly, no dyspnea on exertion or chest discomfort.  No sensation of palpitations.  No lightheadedness on standing.      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 Ears: normal tympanic membranes Neuro: Alert, conversational, full strength upper and lower extremities, normal get up and go, normal gait with no ataxia.  No nystagmus in the eyes.  I performed Dix-Hallpike with both left ear and right ear down, and could not elicit vertigo or  nystagmus. Heart: regular, no murmur Lungs: unlabored, clear throughout     Assessment & Plan:   Problem List Items Addressed This Visit       Unprioritized   Hyperlipidemia - Primary   The 10-year ASCVD risk score (Arnett DK, et al., 2019) is: 8.4%  Patient asked for recommendations about whether he should take a cholesterol medication.  ASCVD risk is intermediate based on last lipids.  He is otherwise very healthy with no history of ischemic event.  He has arthralgia in his left knee when he takes statin medications which are bothersome to him.  I advised him that statins at this point are optional.  I think in the next few years his ASCVD risk is going to increase mostly because of age, and there will be a point when a statin for primary prevention of ischemic vascular disease will become more valuable.      BPPV (benign paroxysmal positional vertigo)   Symptoms and exam today are most consistent with benign positional vertigo.  No symptoms to suggest acute vestibular syndrome.  I do not think this is presyncope.  Excellent health otherwise with no exertional dyspnea.  No symptoms of orthostatic hypotension.  I was not able to elicit the vertigo on Dix-Hallpike maneuvers in the office.  I gave him some printed information about vertigo.  His wife has vertigo as well, so he is  familiar with some of the treatment positionings.  I gave him information about the Epley maneuver.  If symptoms are continuing to bother him, we can refer him to vestibular physical therapy.       Return if symptoms worsen or fail to improve.  Ether Hercules, MD

## 2023-11-21 ENCOUNTER — Ambulatory Visit: Payer: Self-pay | Admitting: *Deleted

## 2023-11-21 NOTE — Telephone Encounter (Signed)
 Copied from CRM #[id number]. Topic: Clinical - Red Word Triage >> Nov 21, 2023 10:37 AM Viola F wrote: Patient coughing, has chest pain, and shortness of breath - requesting to be tested for pneumonia Reason for Disposition  [1] MILD difficulty breathing (e.g., minimal/no SOB at rest, SOB with walking, pulse < 100) AND [2] still present when not coughing  Answer Assessment - Initial Assessment Questions 1. ONSET: When did the cough begin?      I'm coughing up a very little mucus.   I feel a cough coming on when I'm talking.   I'm not having shortness of breath like I can't breath or anything like that.   I'm breathing fine.    The coughing and tightness in my chest.   The coughing made my head hurt.   It's whooping like.   Not been around any children.   I can tell this is nasal congestion going on too.   My throat feels like it might be a little swollen.   When I drink something it feels like my throat is a little swollen.    If I take a deep breath I feel chest tightness.   I'm not getting a deep breath like my normal.   My breathing is different.  I feel like when I have a winter cold and it's not breaking up in my chest.   No pneumonia before. 2. SEVERITY: How bad is the cough today?      It's not constant but annoying.   I want to be tested for pneumonia.   I just want to be sure.    3. SPUTUM: Describe the color of your sputum (e.g., none, dry cough; clear, white, yellow, green)     Very little. 4. HEMOPTYSIS: Are you coughing up any blood? If Yes, ask: How much? (e.g., flecks, streaks, tablespoons, etc.)     Not asked 5. DIFFICULTY BREATHING: Are you having difficulty breathing? If Yes, ask: How bad is it? (e.g., mild, moderate, severe)      Denies shortness of breath or difficulty breathing.   Just a tightness in my chest when I take a deep breath 6. FEVER: Do you have a fever? If Yes, ask: What is your temperature, how was it measured, and when did it start?     No 7.  CARDIAC HISTORY: Do you have any history of heart disease? (e.g., heart attack, congestive heart failure)      No 8. LUNG HISTORY: Do you have any history of lung disease?  (e.g., pulmonary embolus, asthma, emphysema)     No 9. PE RISK FACTORS: Do you have a history of blood clots? (or: recent major surgery, recent prolonged travel, bedridden)     Not asked 10. OTHER SYMPTOMS: Do you have any other symptoms? (e.g., runny nose, wheezing, chest pain)       See above 11. PREGNANCY: Is there any chance you are pregnant? When was your last menstrual period?       N/A 12. TRAVEL: Have you traveled out of the country in the last month? (e.g., travel history, exposures)       N/A  Protocols used: Cough - Acute Productive-A-AH FYI Only or Action Required?: FYI only for provider.  Patient was last seen in primary care on 09/23/2023 by Jerrell Cleatus Ned, MD.  Called Nurse Triage reporting Cough. Non productive cough, throat feels a little swollen when swallowing and chest feels tight when he takes a deep breath.   Symptoms began  a week ago.  Interventions attempted: Nothing. Requesting to be tested for pneumonia.     Symptoms are: unchanged. Not getting better or worse.     Triage Disposition: See Within 3 Days in Office  Only wanted to see Dr. Mahlon.     Patient/caregiver understands and will follow disposition?: Yes

## 2023-11-21 NOTE — Telephone Encounter (Signed)
 Appt made for 13th

## 2023-11-23 ENCOUNTER — Ambulatory Visit: Admitting: Family Medicine

## 2023-11-23 ENCOUNTER — Encounter: Payer: Self-pay | Admitting: Family Medicine

## 2023-11-23 ENCOUNTER — Other Ambulatory Visit (HOSPITAL_BASED_OUTPATIENT_CLINIC_OR_DEPARTMENT_OTHER): Payer: Self-pay

## 2023-11-23 VITALS — BP 114/64 | HR 85 | Temp 98.4°F | Ht 71.0 in | Wt 254.0 lb

## 2023-11-23 DIAGNOSIS — R059 Cough, unspecified: Secondary | ICD-10-CM | POA: Diagnosis not present

## 2023-11-23 DIAGNOSIS — J302 Other seasonal allergic rhinitis: Secondary | ICD-10-CM

## 2023-11-23 MED ORDER — CETIRIZINE HCL 10 MG PO TABS
10.0000 mg | ORAL_TABLET | Freq: Every day | ORAL | 11 refills | Status: DC
  Filled 2023-11-23 (×2): qty 30, 30d supply, fill #0

## 2023-11-23 MED ORDER — AIRSUPRA 90-80 MCG/ACT IN AERO
2.0000 | INHALATION_SPRAY | RESPIRATORY_TRACT | 1 refills | Status: DC | PRN
  Filled 2023-11-23: qty 10.7, 30d supply, fill #0

## 2023-11-23 NOTE — Progress Notes (Signed)
   Subjective:    Patient ID: William Leon, male    DOB: February 17, 1971, 53 y.o.   MRN: [id number]  HPI Cough- 'it's this irritating cough'.  Cough will come and go.  Sxs started ~2 weeks ago.  No fever.  Cough is not productive.  Will start w/ a tickle in his throat.  Has not taken anything for cough.  Having some nasal congestion/PND.  Reports he doesn't feel sick.     Review of Systems For ROS see HPI     Objective:   Physical Exam Vitals reviewed.  Constitutional:      General: He is not in acute distress.    Appearance: Normal appearance. He is not ill-appearing.  HENT:     Head: Normocephalic and atraumatic.     Nose: Congestion present. No rhinorrhea.     Mouth/Throat:     Mouth: Mucous membranes are moist.     Comments: Copious PND Eyes:     Extraocular Movements: Extraocular movements intact.     Conjunctiva/sclera: Conjunctivae normal.  Cardiovascular:     Rate and Rhythm: Normal rate and regular rhythm.     Heart sounds: Normal heart sounds.  Pulmonary:     Effort: Pulmonary effort is normal. No respiratory distress.     Breath sounds: No wheezing or rhonchi.  Neurological:     General: No focal deficit present.     Mental Status: He is alert and oriented to person, place, and time.  Psychiatric:        Mood and Affect: Mood normal.        Behavior: Behavior normal.        Thought Content: Thought content normal.           Assessment & Plan:  Allergic rhinitis- new.  Pt w/ considerable nasal congestion, turbinate edema, and PND.  Suspect this is the cause of his cough and intermittent SOB.  Start daily antihistamine.  Pt expressed understanding and is in agreement w/ plan.   Cough- new.  Likely reactive to seasonal allergies and PND.  Tx w/ Zyrtec .  Add AirSupra  for chest tightness/cough/wheezing.  Pt expressed understanding and is in agreement w/ plan.

## 2023-11-23 NOTE — Patient Instructions (Signed)
 Follow up as needed or as scheduled This cough appears to be triggered by your nasal congestion and post nasal drip- which is causing airway inflammation Your lungs are clear- great news! START Cetirizine  (Zyrtec ) daily Drink lots of fluids to rinse the drainage off the back of your throat If you start to cough or feel your chest get tight- take 2 puffs on the inhaler (coupon provided) Call with any questions or concerns Hang in there!

## 2023-11-24 ENCOUNTER — Other Ambulatory Visit (HOSPITAL_COMMUNITY): Payer: Self-pay

## 2023-11-24 ENCOUNTER — Telehealth: Payer: Self-pay

## 2023-11-24 NOTE — Telephone Encounter (Signed)
 On review of the medication history I do see where it appears he tried an albuterol  Rx but I do not see either of the others, would you be able to confirm any additional tried and failed medications?

## 2023-11-24 NOTE — Telephone Encounter (Unsigned)
 Copied from CRM [id number]. Topic: Clinical - Medication Prior Auth >> Nov 23, 2023  4:49 PM Deleta RAMAN wrote: Reason for CRM: patient needs authorization for inhaler air-super  he will like for someone to give him a call back at [phone]

## 2023-11-24 NOTE — Telephone Encounter (Signed)
 I gave him a coupon so he shouldn't need a prior authorization.  The coupon should be $0 at the pharmacy

## 2023-11-24 NOTE — Telephone Encounter (Addendum)
 Pharmacy Patient Advocate Encounter   Received notification from Pt Calls Messages that prior authorization for AIRSUPRA  INHALER is required/requested.   Insurance verification completed.   The patient is insured through [employer] .   Per test claim: PA required; PA started via CoverMyMeds. KEY [id number] . Please see clinical question(s) below that I am not finding the answer to in their chart and advise.  Does the patient have trial and failure with TWO of the following: albuterol , levalbuterol, or budesonide -formoterol (as maintenance and SMART dosing)?   - I see patient tried albuterol , patient will need to try one more of the list above.SABRASABRA

## 2023-11-24 NOTE — Telephone Encounter (Signed)
 Patient needs PA, please initiate if not already started

## 2024-01-30 ENCOUNTER — Ambulatory Visit: Payer: Self-pay

## 2024-01-30 NOTE — Telephone Encounter (Signed)
 FYI Only or Action Required?: Action required by provider: referral request and update on patient condition. Referral for orthopedics  Patient was last seen in primary care on 11/23/2023 by Mahlon Comer BRAVO, MD.  Called Nurse Triage reporting Knee Pain and chest congestion.  Symptoms began a week ago.  Interventions attempted: Other: mucinex .  Symptoms are: unchanged.  Triage Disposition: See PCP Within 2 Weeks  Patient/caregiver understands and will follow disposition?: Yes  Copied from CRM [id number]. Topic: Clinical - Red Word Triage >> Jan 30, 2024 10:26 AM Drema MATSU wrote: Red Word that prompted transfer to Nurse Triage: Patient states that he has been having trouble with his knees. He can't fully straighten neither of his knees and his in pain. He states that the inner left knee and behind the right knee.  *He thinks he may need a referral to Orthopedics. Reason for Disposition  Cough lasts > 3 weeks  Answer Assessment - Initial Assessment Questions Can hear a rattle when he breathes at times  1. ONSET: When did the cough begin?      2020 with COVID 2. SEVERITY: How bad is the cough today?      Mild 3. SPUTUM: Describe the color of your sputum (e.g., none, dry cough; clear, white, yellow, green)     Denies sputum.  Feels like something is there, but he cannot cough it up 4. HEMOPTYSIS: Are you coughing up any blood? If so ask: How much? (e.g., flecks, streaks, tablespoons, etc.)     denies 5. DIFFICULTY BREATHING: Are you having difficulty breathing? If Yes, ask: How bad is it? (e.g., mild, moderate, severe)      denies 6. FEVER: Do you have a fever? If Yes, ask: What is your temperature, how was it measured, and when did it start?     denies 7. CARDIAC HISTORY: Do you have any history of heart disease? (e.g., heart attack, congestive heart failure)      denies 8. LUNG HISTORY: Do you have any history of lung disease?  (e.g., pulmonary embolus,  asthma, emphysema)     denies 9. PE RISK FACTORS: Do you have a history of blood clots? (or: recent major surgery, recent prolonged travel, bedridden)     denies 10. OTHER SYMPTOMS: Do you have any other symptoms? (e.g., runny nose, wheezing, chest pain)       Intermittent chest pain, runny nose on occasion 11. PREGNANCY: Is there any chance you are pregnant? When was your last menstrual period?       N/a 12. TRAVEL: Have you traveled out of the country in the last month? (e.g., travel history, exposures)       denies  Answer Assessment - Initial Assessment Questions 1. LOCATION and RADIATION: Where is the pain located?      Right leg began one week ago.  Leg feels tight, lacks full ROM. Reports that it feels locked  Left knee pain flared up about two weeks ago History of meniscectomy  Right knee pain is worse than left. 3. SEVERITY: How bad is the pain? What does it keep you from doing?   (Scale 1-10; or mild, moderate, severe)     5/10 with activity, 3/10 at rest 4. ONSET: When did the pain start? Does it come and go, or is it there all the time?     Two weeks ago 5. RECURRENT: Have you had this pain before? If Yes, ask: When, and what happened then?     Right knee pain new,  left knee pain chronic 6. SETTING: Has there been any recent work, exercise or other activity that involved that part of the body?      denies 7. AGGRAVATING FACTORS: What makes the knee pain worse? (e.g., walking, climbing stairs, running)     Extreme knee flexion 8. ASSOCIATED SYMPTOMS: Is there any swelling or redness of the knee?     Right knee swelling 9. OTHER SYMPTOMS: Do you have any other symptoms? (e.g., calf pain, chest pain, difficulty breathing, fever)     Reports that he had chest pain yesterday, but it came and went. Reports that it is nothing new to him and that his has had a cough 10. PREGNANCY: Is there any chance you are pregnant? When was your last menstrual  period?       N/a  Protocols used: Knee Pain-A-AH, Cough - Chronic-A-AH

## 2024-01-30 NOTE — Telephone Encounter (Signed)
 Patient has appointment with you 02/01/24 to address concerns. FYI

## 2024-02-01 ENCOUNTER — Other Ambulatory Visit (HOSPITAL_BASED_OUTPATIENT_CLINIC_OR_DEPARTMENT_OTHER): Payer: Self-pay

## 2024-02-01 ENCOUNTER — Encounter: Payer: Self-pay | Admitting: Family Medicine

## 2024-02-01 ENCOUNTER — Ambulatory Visit: Payer: Self-pay | Admitting: Family Medicine

## 2024-02-01 VITALS — BP 122/74 | Temp 98.0°F | Ht 71.0 in | Wt 254.0 lb

## 2024-02-01 DIAGNOSIS — R059 Cough, unspecified: Secondary | ICD-10-CM

## 2024-02-01 DIAGNOSIS — M25561 Pain in right knee: Secondary | ICD-10-CM

## 2024-02-01 MED ORDER — AIRSUPRA 90-80 MCG/ACT IN AERO
2.0000 | INHALATION_SPRAY | RESPIRATORY_TRACT | 1 refills | Status: DC | PRN
  Filled 2024-02-01: qty 10.7, 30d supply, fill #0

## 2024-02-01 NOTE — Progress Notes (Signed)
   Subjective:    Patient ID: William Leon, male    DOB: 1970-07-04, 53 y.o.   MRN: [id number]  HPI Cough- at last visit it was discussed that cough is likely airway inflammation.  Not taking allergy medication.  Was prescribed AirSupra  but did not pick it up b/c of cost.  Cough is mostly dry.  If he coughs 'really hard' he will cough up a small amount.  Knee pain- R leg will have pain behind the knee when he straightens leg.  Feels that there is fluid or swelling behind his knee.     Review of Systems For ROS see HPI     Objective:   Physical Exam Vitals reviewed.  Constitutional:      General: He is not in acute distress.    Appearance: Normal appearance. He is not ill-appearing.  HENT:     Head: Normocephalic and atraumatic.  Eyes:     Extraocular Movements: Extraocular movements intact.     Conjunctiva/sclera: Conjunctivae normal.  Cardiovascular:     Rate and Rhythm: Normal rate and regular rhythm.  Pulmonary:     Effort: Pulmonary effort is normal. No respiratory distress.     Breath sounds: No wheezing or rhonchi.  Musculoskeletal:        General: Swelling (fullness behind R knee) present.     Cervical back: Neck supple.     Comments: Pain w/ extreme flexion and full extension of R knee  Lymphadenopathy:     Cervical: No cervical adenopathy.  Skin:    General: Skin is warm and dry.  Neurological:     General: No focal deficit present.     Mental Status: He is alert and oriented to person, place, and time.  Psychiatric:        Mood and Affect: Mood normal.        Behavior: Behavior normal.        Thought Content: Thought content normal.           Assessment & Plan:  Cough- ongoing issue.  Pt reports cough since COVID in 2020.  Sxs are consistent w/ reactive airway disease.  Last visit prescribed AirSupra  but he did not pick it up due to cost.  Offered to prescribe regular Albuterol  instead but pt would like to try again for the AirSupra  b/c 'it's better'.   Will refer to Pulmonary for complete evaluation of possible RAD/Asthma/Chronic Bronchitis.  Pt expressed understanding and is in agreement w/ plan.   R knee pain- new.  Pt has fullness behind R knee that could be consistent w/ Baker's cyst.  Refer to Ortho for complete evaluation.  Pt expressed understanding and is in agreement w/ plan.

## 2024-02-01 NOTE — Patient Instructions (Addendum)
 Follow up as needed or as scheduled We'll call you to schedule your Ortho and Pulmonary appts START the Cetirizine  daily for allergies and congestion IF the inhaler does not get approved- let me know Call with any questions or concerns Stay Safe!  Stay Healthy! Hang in there!

## 2024-02-02 ENCOUNTER — Other Ambulatory Visit (HOSPITAL_BASED_OUTPATIENT_CLINIC_OR_DEPARTMENT_OTHER): Payer: Self-pay

## 2024-02-03 ENCOUNTER — Other Ambulatory Visit (HOSPITAL_BASED_OUTPATIENT_CLINIC_OR_DEPARTMENT_OTHER): Payer: Self-pay

## 2024-02-06 ENCOUNTER — Other Ambulatory Visit (HOSPITAL_BASED_OUTPATIENT_CLINIC_OR_DEPARTMENT_OTHER): Payer: Self-pay

## 2024-02-07 ENCOUNTER — Other Ambulatory Visit (HOSPITAL_COMMUNITY): Payer: Self-pay

## 2024-02-07 ENCOUNTER — Other Ambulatory Visit (HOSPITAL_BASED_OUTPATIENT_CLINIC_OR_DEPARTMENT_OTHER): Payer: Self-pay

## 2024-02-07 ENCOUNTER — Telehealth: Payer: Self-pay

## 2024-02-07 MED ORDER — ALBUTEROL SULFATE HFA 108 (90 BASE) MCG/ACT IN AERS
2.0000 | INHALATION_SPRAY | Freq: Four times a day (QID) | RESPIRATORY_TRACT | 3 refills | Status: AC | PRN
  Filled 2024-02-07: qty 6.7, 25d supply, fill #0

## 2024-02-07 NOTE — Telephone Encounter (Signed)
 Have you heard of this happening with copay card   Per test claim: I see the encounter from 11/24/23 that the prior auth was denied. The Dr responded saying that the coupon will override the prior auth, however the coupon is now rejecting when we try to bypass the primary insurance. I have called the copay card help desk and they said the primary insurance has to pay first before the copay card will work. Not really sure what Dr Mahlon would like to do from here.

## 2024-02-07 NOTE — Addendum Note (Signed)
 Addended by: Jhamari Markowicz E on: 02/07/2024 01:41 PM   Modules accepted: Orders

## 2024-02-07 NOTE — Telephone Encounter (Signed)
 Pharmacy Patient Advocate Encounter   Received notification from Onbase that prior authorization for Airsupra -Budesonide  90-80 mcg/ACT inhaler is required/requested.   Insurance verification completed.   The patient is insured through [employer].   Per test claim: I see the encounter from 11/24/23 that the prior auth was denied. The Dr responded saying that the coupon will override the prior auth, however the coupon is now rejecting when we try to bypass the primary insurance. I have called the copay card help desk and they said the primary insurance has to pay first before the copay card will work. Not really sure what Dr Mahlon would like to do from here.

## 2024-02-07 NOTE — Telephone Encounter (Signed)
 I have not heard of this but rather than continuing to fight with the insurance company, I'll send in Albuterol  to use for cough/wheezing/airway inflammation

## 2024-02-07 NOTE — Telephone Encounter (Signed)
 Patient verbalized understanding

## 2024-02-15 ENCOUNTER — Other Ambulatory Visit: Payer: Self-pay

## 2024-02-15 ENCOUNTER — Other Ambulatory Visit (HOSPITAL_BASED_OUTPATIENT_CLINIC_OR_DEPARTMENT_OTHER): Payer: Self-pay

## 2024-02-15 MED ORDER — PREDNISONE 5 MG (21) PO TBPK
ORAL_TABLET | ORAL | 1 refills | Status: DC
  Filled 2024-02-15: qty 21, 6d supply, fill #0

## 2024-02-16 ENCOUNTER — Ambulatory Visit: Payer: Self-pay | Admitting: Orthopaedic Surgery

## 2024-03-04 NOTE — Progress Notes (Signed)
 New Patient Pulmonology Office Visit   Subjective:  Patient ID: William Leon, male    DOB: 1970-12-09  MRN: [id number]  Referred by: Mahlon Comer BRAVO, MD  CC: No chief complaint on file.   HPI William Leon is a 53 y.o. male with *** Cough since COVID in 2020.  Sxs are consistent w/ reactive airway disease. Last visit prescribed AirSupra  but he did not pick it up due to cost. Offered to prescribe regular Albuterol  instead but pt would like to try again for the AirSupra  b/c 'it's better'. Will refer to Pulmonary for complete evaluation of possible RAD/Asthma/Chronic Bronchitis.   {PULM QUESTIONNAIRES (Optional):33196} Discussed the use of AI scribe software for clinical note transcription with the patient, who gave verbal consent to proceed.  History of Present Illness   ROS  Allergies: Patient has no known allergies.  Current Outpatient Medications:    albuterol  (VENTOLIN  HFA) 108 (90 Base) MCG/ACT inhaler, Inhale 2 puffs into the lungs every 6 (six) hours as needed for wheezing or shortness of breath., Disp: 8 g, Rfl: 3   atorvastatin  (LIPITOR) 20 MG tablet, Take 1 tablet (20 mg total) by mouth daily., Disp: 90 tablet, Rfl: 1   cetirizine  (ZYRTEC ) 10 MG tablet, Take 1 tablet (10 mg total) by mouth daily., Disp: 30 tablet, Rfl: 11   Omega-3 Fatty Acids (OMEGA 3 PO), Take by mouth daily. XL TAKE 4 ONCE DAILY, Disp: , Rfl:    predniSONE  (STERAPRED UNI-PAK 21 TAB) 5 MG (21) TBPK tablet, Take as directed per package instructions for 6 days with food., Disp: 21 each, Rfl: 1 Past Medical History:  Diagnosis Date   Allergy    seasonal-ragweed   Arthritis    KNEE,THUMB   Gastric ulcer    H. pylori infection    Hyperlipidemia    Past Surgical History:  Procedure Laterality Date   ACHILLES TENDON SURGERY Right 01/2017   BIOPSY  02/19/2020   Procedure: BIOPSY;  Surgeon: Eda Iha, MD;  Location: [facility];  Service: Gastroenterology;;   ESOPHAGOGASTRODUODENOSCOPY  (EGD) WITH PROPOFOL  N/A 02/19/2020   Procedure: ESOPHAGOGASTRODUODENOSCOPY (EGD) WITH PROPOFOL ;  Surgeon: Eda Iha, MD;  Location: [facility];  Service: Gastroenterology;  Laterality: N/A;   KNEE SURGERY Left    ARTHOSCOPY   Family History  Problem Relation Age of Onset   Alcohol abuse Brother    Bowel Disease Brother    Colon cancer Neg Hx    Colon polyps Neg Hx    Esophageal cancer Neg Hx    Rectal cancer Neg Hx    Stomach cancer Neg Hx    Crohn's disease Neg Hx    Ulcerative colitis Neg Hx    Social History   Socioeconomic History   Marital status: Married    Spouse name: Not on file   Number of children: Not on file   Years of education: Not on file   Highest education level: Bachelor's degree (e.g., BA, AB, BS)  Occupational History   Not on file  Tobacco Use   Smoking status: Never   Smokeless tobacco: Never  Vaping Use   Vaping status: Never Used  Substance and Sexual Activity   Alcohol use: No    Alcohol/week: 0.0 standard drinks of alcohol   Drug use: No   Sexual activity: Yes    Birth control/protection: None  Other Topics Concern   Not on file  Social History Narrative   Not on file   Social Drivers of Health   Financial  Resource Strain: Low Risk  (05/31/2023)   Overall Financial Resource Strain (CARDIA)    Difficulty of Paying Living Expenses: Not hard at all  Food Insecurity: No Food Insecurity (05/31/2023)   Hunger Vital Sign    Worried About Running Out of Food in the Last Year: Never true    Ran Out of Food in the Last Year: Never true  Transportation Needs: No Transportation Needs (05/31/2023)   PRAPARE - Administrator, Civil Service (Medical): No    Lack of Transportation (Non-Medical): No  Physical Activity: Insufficiently Active (05/31/2023)   Exercise Vital Sign    Days of Exercise per Week: 3 days    Minutes of Exercise per Session: 30 min  Stress: No Stress Concern Present (05/31/2023)   [employer] of  Occupational Health - Occupational Stress Questionnaire    Feeling of Stress : Only a little  Social Connections: Unknown (05/31/2023)   Social Connection and Isolation Panel    Frequency of Communication with Friends and Family: Once a week    Frequency of Social Gatherings with Friends and Family: Not on file    Attends Religious Services: More than 4 times per year    Active Member of [employer] or Organizations: No    Attends Engineer, Structural: Not on file    Marital Status: Married  Catering Manager Violence: Not on file       Objective:  There were no vitals taken for this visit. {Pulm Vitals (Optional):32837}  Physical Exam  Diagnostic Review:  {Labs (Optional):32838}     Assessment & Plan:   Assessment & Plan  Assessment and Plan Assessment & Plan    No follow-ups on file.    Marny Patch, MD Pulmonary and Critical Care Medicine [facility]

## 2024-03-05 ENCOUNTER — Other Ambulatory Visit (HOSPITAL_BASED_OUTPATIENT_CLINIC_OR_DEPARTMENT_OTHER): Payer: Self-pay

## 2024-03-05 ENCOUNTER — Ambulatory Visit (INDEPENDENT_AMBULATORY_CARE_PROVIDER_SITE_OTHER)

## 2024-03-05 ENCOUNTER — Ambulatory Visit: Payer: Self-pay

## 2024-03-05 VITALS — BP 132/76 | HR 92 | Temp 98.4°F | Ht 71.0 in | Wt 253.8 lb

## 2024-03-05 DIAGNOSIS — R058 Other specified cough: Secondary | ICD-10-CM | POA: Diagnosis not present

## 2024-03-05 MED ORDER — GUAIFENESIN ER 600 MG PO TB12
600.0000 mg | ORAL_TABLET | Freq: Two times a day (BID) | ORAL | 0 refills | Status: AC
  Filled 2024-03-05: qty 40, 20d supply, fill #0

## 2024-03-05 MED ORDER — BENZONATATE 100 MG PO CAPS
100.0000 mg | ORAL_CAPSULE | Freq: Three times a day (TID) | ORAL | 0 refills | Status: AC | PRN
  Filled 2024-03-05: qty 30, 10d supply, fill #0

## 2024-03-05 NOTE — Patient Instructions (Addendum)
 Dear William Leon;  I will recommend the following for your cough:  -Mucinex  twice a day to break the mucus for 10 days. -Tessalon  3 times a day for 10 days for cough. -Albuterol  inhaler 3 times a day for 2 weeks.  I will see you in 4 weeks.

## 2024-03-06 ENCOUNTER — Ambulatory Visit: Payer: Self-pay | Admitting: Orthopaedic Surgery

## 2024-03-06 ENCOUNTER — Ambulatory Visit (INDEPENDENT_AMBULATORY_CARE_PROVIDER_SITE_OTHER): Payer: Self-pay

## 2024-03-06 ENCOUNTER — Ambulatory Visit (HOSPITAL_COMMUNITY)
Admission: RE | Admit: 2024-03-06 | Discharge: 2024-03-06 | Disposition: A | Discharge status: Home / Self Care | Source: Ambulatory Visit | Attending: Vascular Surgery | Admitting: Vascular Surgery

## 2024-03-06 DIAGNOSIS — M25561 Pain in right knee: Secondary | ICD-10-CM

## 2024-03-06 MED ORDER — METHYLPREDNISOLONE ACETATE 40 MG/ML IJ SUSP
40.0000 mg | INTRAMUSCULAR | Status: AC | PRN
  Administered 2024-03-06: 40 mg via INTRA_ARTICULAR

## 2024-03-06 MED ORDER — LIDOCAINE HCL 1 % IJ SOLN
2.0000 mL | INTRAMUSCULAR | Status: AC | PRN
  Administered 2024-03-06: 2 mL

## 2024-03-06 MED ORDER — BUPIVACAINE HCL 0.5 % IJ SOLN
2.0000 mL | INTRAMUSCULAR | Status: AC | PRN
  Administered 2024-03-06: 2 mL via INTRA_ARTICULAR

## 2024-03-06 NOTE — Progress Notes (Signed)
 Office Visit Note   Patient: William Leon           Date of Birth: Mar 02, 1971           MRN: [id number] Visit Date: 03/06/2024              Requested by: Mahlon Comer BRAVO, MD [id number]  [place] [address] PCP: Mahlon Comer BRAVO, MD   Assessment & Plan: Visit Diagnoses:  1. Right knee pain, unspecified chronicity     Plan: History of Present Illness William Leon is a 53 year old male with knee osteoarthritis who presents with persistent right knee pain and swelling.   He has had severe pain in the back of the right knee for about two weeks after cortisone injections in both knees, with initial worsening and swelling. The pain improved briefly, then recurred. It is new to this area and described as an intense squeezing sensation, especially when lying in bed, which he attributes to fluid and tightness. Swelling has been significant and has altered his gait, causing a limp at work.  He denies fever or other systemic symptoms.  Physical Exam MUSCULOSKELETAL: Right knee effusion present.  Tightness with flexion.  Collaterals and cruciates are stable.  Results RADIOLOGY Knee X-ray: Bone on bone in left knee, not bone on bone in right knee (02/21/2024)  Assessment and Plan Right knee pain with effusion and synovitis, possible Baker's cyst Right knee pain with effusion and synovitis likely due to arthritis flare. Possible Baker's cyst contributing to symptoms. Differential includes blood clot, though unlikely. - Ordered ultrasound to rule out blood clot. - Drained knee effusion and administered cortisone injection. - Maintain MRI appointment on December 6th at [facility]  Left knee severe osteoarthritis (bone on bone) Severe osteoarthritis in the left knee with bone on bone contact. Gel shots are less effective in advanced osteoarthritis cases. - Discussed limited efficacy of gel shots in bone on bone osteoarthritis.  Follow-Up Instructions: No follow-ups on  file.   Orders:  Orders Placed This Encounter  Procedures   XR KNEE 3 VIEW RIGHT   VAS US  LOWER EXTREMITY VENOUS (DVT)   No orders of the defined types were placed in this encounter.     Procedures: Large Joint Inj: R knee on 03/06/2024 12:29 PM Indications: pain Details: 22 G needle  Arthrogram: No  Medications: 40 mg methylPREDNISolone  acetate 40 MG/ML; 2 mL lidocaine  1 %; 2 mL bupivacaine  0.5 % Aspirate: 15 mL Consent was given by the patient. Patient was prepped and draped in the usual sterile fashion.       Clinical Data: No additional findings.   Subjective: Chief Complaint  Patient presents with   Right Knee - Pain    HPI  Review of Systems  Constitutional: Negative.   HENT: Negative.    Eyes: Negative.   Respiratory: Negative.    Cardiovascular: Negative.   Gastrointestinal: Negative.   Endocrine: Negative.   Genitourinary: Negative.   Skin: Negative.   Allergic/Immunologic: Negative.   Neurological: Negative.   Hematological: Negative.   Psychiatric/Behavioral: Negative.    All other systems reviewed and are negative.    Objective: Vital Signs: There were no vitals taken for this visit.  Physical Exam Vitals and nursing note reviewed.  Constitutional:      Appearance: He is well-developed.  HENT:     Head: Normocephalic and atraumatic.  Eyes:     Pupils: Pupils are equal, round, and reactive to light.  Pulmonary:     Effort: Pulmonary effort is normal.  Abdominal:     Palpations: Abdomen is soft.  Musculoskeletal:        General: Normal range of motion.     Cervical back: Neck supple.  Skin:    General: Skin is warm.  Neurological:     Mental Status: He is alert and oriented to person, place, and time.  Psychiatric:        Behavior: Behavior normal.        Thought Content: Thought content normal.        Judgment: Judgment normal.     Ortho Exam  Specialty Comments:  No specialty comments available.  Imaging: XR KNEE  3 VIEW RIGHT Result Date: 03/06/2024 X-rays demonstrate severe tricompartmental osteoarthritis.  Bone-on-bone joint space narrowing.  Kellgren-Lawrence stage IV  VAS US  LOWER EXTREMITY VENOUS (DVT) Result Date: 03/06/2024  Lower Venous DVT Study Patient Name:  William Leon  Date of Exam:   03/06/2024 Medical Rec #: [id number]     Accession #:    [id number] Date of Birth: 1970-07-14    Patient Gender: M Patient Age:   62 years Exam Location:  [address] Procedure:      VAS US  LOWER EXTREMITY VENOUS (DVT) Referring Phys: KAY Jamori Biggar --------------------------------------------------------------------------------  Indications: Pain.  Risk Factors: None identified. Comparison Study: 02/10/17 Performing Technologist: Garnette Rockers  Examination Guidelines: A complete evaluation includes B-mode imaging, spectral Doppler, color Doppler, and power Doppler as needed of all accessible portions of each vessel. Bilateral testing is considered an integral part of a complete examination. Limited examinations for reoccurring indications may be performed as noted. The reflux portion of the exam is performed with the patient in reverse Trendelenburg.  +---------+---------------+---------+-----------+----------+--------------+ RIGHT    CompressibilityPhasicitySpontaneityPropertiesThrombus Aging +---------+---------------+---------+-----------+----------+--------------+ CFV      Full           Yes      Yes                                 +---------+---------------+---------+-----------+----------+--------------+ SFJ      Full                                                        +---------+---------------+---------+-----------+----------+--------------+ FV Prox  Full                                                        +---------+---------------+---------+-----------+----------+--------------+ FV Mid   Full           Yes      Yes                                  +---------+---------------+---------+-----------+----------+--------------+ FV DistalFull                                                        +---------+---------------+---------+-----------+----------+--------------+  PFV      Full                                                        +---------+---------------+---------+-----------+----------+--------------+ POP      Full           Yes      Yes                                 +---------+---------------+---------+-----------+----------+--------------+ PTV      Full                    Yes                                 +---------+---------------+---------+-----------+----------+--------------+ PERO     Full                    Yes                                 +---------+---------------+---------+-----------+----------+--------------+   +----+---------------+---------+-----------+----------+--------------+ LEFTCompressibilityPhasicitySpontaneityPropertiesThrombus Aging +----+---------------+---------+-----------+----------+--------------+ CFV Full           Yes      Yes                                 +----+---------------+---------+-----------+----------+--------------+     Summary: RIGHT: - There is no evidence of deep vein thrombosis in the lower extremity.  - No cystic structure found in the popliteal fossa.  LEFT: - No evidence of common femoral vein obstruction.   *See table(s) above for measurements and observations. Electronically signed by Lonni Gaskins MD on 03/06/2024 at 9:44:22 AM.    Final      PMFS History: Patient Active Problem List   Diagnosis Date Noted   BPPV (benign paroxysmal positional vertigo) 09/23/2023   H. pylori duodenitis 02/27/2020   Hypocalcemia 02/19/2020   Anemia    Gastric ulcer with hemorrhage    Melena 02/18/2020   Hyperlipidemia    Azotemia    Obesity (BMI 30-39.9) 04/11/2019   Plantar fasciitis 10/31/2017   Achilles tendon rupture, right, subsequent  encounter 02/07/2017   Elevated serum creatinine 05/22/2014   Physical exam, annual 11/04/2011   Pain in left testicle 10/22/2009   LENTIGO 10/01/2009   Past Medical History:  Diagnosis Date   Allergy 2017   seasonal-ragweed   Arthritis    KNEE,THUMB   Gastric ulcer    H. pylori infection    Hyperlipidemia     Family History  Problem Relation Age of Onset   Alcohol abuse Brother    Bowel Disease Brother    Colon cancer Neg Hx    Colon polyps Neg Hx    Esophageal cancer Neg Hx    Rectal cancer Neg Hx    Stomach cancer Neg Hx    Crohn's disease Neg Hx    Ulcerative colitis Neg Hx     Past Surgical History:  Procedure Laterality Date   ACHILLES TENDON SURGERY Right 01/2017   BIOPSY  02/19/2020   Procedure: BIOPSY;  Surgeon: Eda Iha,  MD;  Location: [facility];  Service: Gastroenterology;;   ESOPHAGOGASTRODUODENOSCOPY (EGD) WITH PROPOFOL  N/A 02/19/2020   Procedure: ESOPHAGOGASTRODUODENOSCOPY (EGD) WITH PROPOFOL ;  Surgeon: Eda Iha, MD;  Location: [facility];  Service: Gastroenterology;  Laterality: N/A;   FRACTURE SURGERY  Oct/2018   Achilles tendon repair   KNEE SURGERY Left    ARTHOSCOPY   Social History   Occupational History   Not on file  Tobacco Use   Smoking status: Never   Smokeless tobacco: Never  Vaping Use   Vaping status: Never Used  Substance and Sexual Activity   Alcohol use: No    Alcohol/week: 0.0 standard drinks of alcohol   Drug use: No   Sexual activity: Yes    Birth control/protection: None

## 2024-04-16 ENCOUNTER — Ambulatory Visit: Admitting: Physician Assistant

## 2024-04-16 DIAGNOSIS — M1711 Unilateral primary osteoarthritis, right knee: Secondary | ICD-10-CM

## 2024-04-16 NOTE — Progress Notes (Signed)
 "  Office Visit Note   Patient: William Leon           Date of Birth: Oct 13, 1970           MRN: [id number] Visit Date: 04/16/2024              Requested by: Mahlon Comer BRAVO, MD [phone]  [place] [address] PCP: Mahlon Comer BRAVO, MD   Assessment & Plan: Visit Diagnoses:  1. Unilateral primary osteoarthritis, right knee     Plan: Impression is chronic right knee pain with only temporary relief from previous cortisone injections.  Patient's symptoms do seem more consistent with arthritis and meniscal pathology, however he would like to go ahead and reschedule his MRI as his symptoms have returned.  I have discussed following up with Dr. Jerri to go over this MRI once it has been completed.  Call with concerns or questions.  Follow-Up Instructions: Return if symptoms worsen or fail to improve.   Orders:  Orders Placed This Encounter  Procedures   MR Knee Right w/o contrast   No orders of the defined types were placed in this encounter.     Procedures: No procedures performed   Clinical Data: No additional findings.   Subjective: Chief Complaint  Patient presents with   Left Knee - Pain   Right Knee - Pain    HPI patient is a pleasant 54 year old gentleman who comes in today with recurrent right knee pain and swelling.  Does have a history of osteoarthritis in the right knee.  He has been seen by Dr. Baird at [facility] as well as Dr. Jerri for treatment.  He has undergone multiple cortisone injections to the right knee as well as an aspiration injection at his last visit with Dr. Jerri in November of this year.  At that time, he had an MRI scheduled at [facility] for December 6 but his knee started feeling better so this was canceled.  At that time, he also was complaining of pain to the posterior knee.  Ultrasounds was ordered which was negative for DVT.  His knee pain has returned.  He is here today to see me for this.  The pain he is having now is the medial  joint line.  Symptoms are constant with associated swelling.  Pain is worse going from a seated to standing position.  He does get some relief after walking a few steps.  He denies any mechanical symptoms.  Review of Systems as detailed in HPI.  All others reviewed and are negative.   Objective: Vital Signs: There were no vitals taken for this visit.  Physical Exam well-developed well-nourished gentleman in no acute distress.  Alert and oriented x 3.  Ortho Exam right knee exam: Small effusion.  Range of motion 0 to 100 degrees.  Medial joint line tenderness.  He is neurovascular intact distally.  Specialty Comments:  No specialty comments available.  Imaging: No new imaging   PMFS History: Patient Active Problem List   Diagnosis Date Noted   BPPV (benign paroxysmal positional vertigo) 09/23/2023   H. 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 pylori infection    Hyperlipidemia     Family History  Problem Relation Age of Onset   Alcohol abuse Brother    Bowel Disease Brother    Colon cancer Neg Hx    Colon polyps Neg Hx    Esophageal cancer Neg Hx    Rectal cancer Neg Hx    Stomach cancer Neg Hx    Crohn's disease Neg Hx    Ulcerative colitis Neg Hx     Past Surgical History:  Procedure Laterality Date   ACHILLES TENDON SURGERY Right 01/2017   BIOPSY  02/19/2020   Procedure: BIOPSY;  Surgeon: Eda Iha, MD;  Location: [facility];  Service: Gastroenterology;;    ESOPHAGOGASTRODUODENOSCOPY (EGD) WITH PROPOFOL  N/A 02/19/2020   Procedure: ESOPHAGOGASTRODUODENOSCOPY (EGD) WITH PROPOFOL ;  Surgeon: Eda Iha, MD;  Location: [facility];  Service: Gastroenterology;  Laterality: N/A;   FRACTURE SURGERY  Oct/2018   Achilles tendon repair   KNEE SURGERY Left    ARTHOSCOPY   Social History   Occupational History   Not on file  Tobacco Use   Smoking status: Never   Smokeless tobacco: Never  Vaping Use   Vaping status: Never Used  Substance and Sexual Activity   Alcohol use: No    Alcohol/week: 0.0 standard drinks of alcohol   Drug use: No   Sexual activity: Yes    Birth control/protection: None        "

## 2024-04-18 ENCOUNTER — Ambulatory Visit (INDEPENDENT_AMBULATORY_CARE_PROVIDER_SITE_OTHER)

## 2024-04-18 DIAGNOSIS — R058 Other specified cough: Secondary | ICD-10-CM | POA: Diagnosis not present

## 2024-04-18 LAB — PULMONARY FUNCTION TEST
DL/VA % pred: 128 %
DL/VA: 5.55 ml/min/mmHg/L
DLCO unc % pred: 125 %
DLCO unc: 36.94 ml/min/mmHg
FEF 25-75 Post: 4.71 L/s
FEF 25-75 Pre: 4.92 L/s
FEF2575-%Change-Post: -4 %
FEF2575-%Pred-Post: 142 %
FEF2575-%Pred-Pre: 148 %
FEV1-%Change-Post: 2 %
FEV1-%Pred-Post: 98 %
FEV1-%Pred-Pre: 96 %
FEV1-Post: 3.86 L
FEV1-Pre: 3.75 L
FEV1FVC-%Change-Post: 0 %
FEV1FVC-%Pred-Pre: 110 %
FEV6-%Change-Post: 3 %
FEV6-%Pred-Post: 93 %
FEV6-%Pred-Pre: 89 %
FEV6-Post: 4.55 L
FEV6-Pre: 4.38 L
FEV6FVC-%Pred-Post: 104 %
FEV6FVC-%Pred-Pre: 104 %
FVC-%Change-Post: 2 %
FVC-%Pred-Post: 89 %
FVC-%Pred-Pre: 87 %
FVC-Post: 4.55 L
FVC-Pre: 4.43 L
Post FEV1/FVC ratio: 85 %
Post FEV6/FVC ratio: 100 %
Pre FEV1/FVC ratio: 85 %
Pre FEV6/FVC Ratio: 100 %
RV % pred: 131 %
RV: 2.88 L
TLC % pred: 104 %
TLC: 7.5 L

## 2024-04-18 NOTE — Progress Notes (Signed)
 "  New Patient Pulmonology Office Visit   Subjective:  Patient ID: NHIA HEAPHY, male    DOB: 12-05-1970  MRN: [id number]  Referred by: Adrien Guan, Tamala, MD  CC:  Chief Complaint  Patient presents with   Medical Management of Chronic Issues    Pt states no new concerns    Discussed the use of AI scribe software for clinical note transcription with the patient, who gave verbal consent to proceed.  History of Present Illness William Leon is a 54 year old male without any significant pulmonary history who presents with a persistent cough for evaluation.  He experiences a persistent cough for two months, occurring two to three times daily, sometimes productive of a small amount of phlegm. The cough does not occur during sleep but starts upon waking. He uses an albuterol  inhaler once or twice daily for the past two to three weeks but feels it may worsen the cough.  No wheezing is present. No dyspnea on exertion, he is able to perform all his activities. Chest tightness is noted when lying on his side, resolving upon changing position, however not present with exertion. He has acid reflux, particularly after overeating, and ragweed allergies, which may contribute to a runny nose and possible postnasal drip.  He was prescribed prednisone  for knee pain but uses it sparingly due to kidney concerns. He had COVID-19 in 2020 and is currently experiencing stress from a divorce.  ROS as above  Interval History  -Patient reported to continue same symptoms of cough, occasionally 3 times a day, and it started as tickling in his throat and last 4 seconds. It is mainly dry. -He does not remember if tessalon  or mucinex  make any difference. He has used albuterol  as need it, but it does not seem to help neither.  -His cough is not debilitating and not cause issues in his ADLs. -His CXR and PFT is normal.   Allergies: Patient has no known allergies.  Current Outpatient Medications:    albuterol   (VENTOLIN  HFA) 108 (90 Base) MCG/ACT inhaler, Inhale 2 puffs into the lungs every 6 (six) hours as needed for wheezing or shortness of breath. (Patient not taking: Reported on 03/05/2024), Disp: 8 g, Rfl: 3   atorvastatin  (LIPITOR) 20 MG tablet, Take 1 tablet (20 mg total) by mouth daily. (Patient not taking: Reported on 03/05/2024), Disp: 90 tablet, Rfl: 1   cetirizine  (ZYRTEC ) 10 MG tablet, Take 1 tablet (10 mg total) by mouth daily. (Patient not taking: Reported on 03/05/2024), Disp: 30 tablet, Rfl: 11   Omega-3 Fatty Acids (OMEGA 3 PO), Take by mouth daily. XL TAKE 4 ONCE DAILY (Patient not taking: Reported on 03/05/2024), Disp: , Rfl:    predniSONE  (STERAPRED UNI-PAK 21 TAB) 5 MG (21) TBPK tablet, Take as directed per package instructions for 6 days with food. 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 pylori infection    Hyperlipidemia    Past Surgical History:  Procedure Laterality Date   ACHILLES TENDON SURGERY Right 01/2017   BIOPSY  02/19/2020   Procedure: BIOPSY;  Surgeon: Eda Iha, MD;  Location: [facility];  Service: Gastroenterology;;   ESOPHAGOGASTRODUODENOSCOPY (EGD) WITH PROPOFOL  N/A 02/19/2020   Procedure: ESOPHAGOGASTRODUODENOSCOPY (EGD) WITH PROPOFOL ;  Surgeon: Eda Iha, MD;  Location: [facility];  Service: Gastroenterology;  Laterality: N/A;  FRACTURE SURGERY  Oct/2018   Achilles tendon repair   KNEE SURGERY Left    ARTHOSCOPY   Family History  Problem Relation Age of Onset   Alcohol abuse Brother    Bowel Disease Brother    Colon cancer Neg Hx    Colon polyps Neg Hx    Esophageal cancer Neg Hx    Rectal cancer Neg Hx    Stomach cancer Neg Hx    Crohn's disease Neg Hx    Ulcerative colitis Neg Hx    Social History   Socioeconomic History   Marital status: Married    Spouse name: Not on  file   Number of children: Not on file   Years of education: Not on file   Highest education level: Bachelor's degree (e.g., BA, AB, BS)  Occupational History   Not on file  Tobacco Use   Smoking status: Never   Smokeless tobacco: Never  Vaping Use   Vaping status: Never Used  Substance and Sexual Activity   Alcohol use: No    Alcohol/week: 0.0 standard drinks of alcohol   Drug use: No   Sexual activity: Yes    Birth control/protection: None  Other Topics Concern   Not on file  Social History Narrative   Not on file   Social Drivers of Health   Tobacco Use: Low Risk (03/05/2024)   Patient History    Smoking Tobacco Use: Never    Smokeless Tobacco Use: Never    Passive Exposure: Not on file  Financial Resource Strain: Low Risk (05/31/2023)   Overall Financial Resource Strain (CARDIA)    Difficulty of Paying Living Expenses: Not hard at all  Food Insecurity: No Food Insecurity (05/31/2023)   Hunger Vital Sign    Worried About Running Out of Food in the Last Year: Never true    Ran Out of Food in the Last Year: Never true  Transportation Needs: No Transportation Needs (05/31/2023)   PRAPARE - Administrator, Civil Service (Medical): No    Lack of Transportation (Non-Medical): No  Physical Activity: Insufficiently Active (05/31/2023)   Exercise Vital Sign    Days of Exercise per Week: 3 days    Minutes of Exercise per Session: 30 min  Stress: No Stress Concern Present (05/31/2023)   [employer] of Occupational Health - Occupational Stress Questionnaire    Feeling of Stress : Only a little  Social Connections: Unknown (05/31/2023)   Social Connection and Isolation Panel    Frequency of Communication with Friends and Family: Once a week    Frequency of Social Gatherings with Friends and Family: Not on file    Attends Religious Services: More than 4 times per year    Active Member of [employer] or Organizations: No    Attends Banker Meetings: Not on  file    Marital Status: Married  Catering Manager Violence: Not on file  Depression (PHQ2-9): Low Risk (05/31/2023)   Depression (PHQ2-9)    PHQ-2 Score: 0  Alcohol Screen: Not on file  Housing: Low Risk (05/31/2023)   Housing Stability Vital Sign    Unable to Pay for Housing in the Last Year: No    Number of Times Moved in the Last Year: 0    Homeless in the Last Year: No  Utilities: Not on file  Health Literacy: Not on file       Objective:  There were no vitals taken for this visit. Wt Readings from Last 3 Encounters:  03/05/24 253 lb 12.8 oz (115.1 kg)  02/01/24 254 lb (115.2 kg)  11/23/23 254 lb (115.2 kg)   BMI Readings from Last 3 Encounters:  03/05/24 35.40 kg/m  02/01/24 35.43 kg/m  11/23/23 35.43 kg/m   SpO2 Readings from Last 3 Encounters:  03/05/24 95%  02/01/24 98%  11/23/23 98%    Physical Exam Constitutional:      Appearance: Normal appearance.  HENT:     Head: Normocephalic.     Mouth/Throat:     Mouth: Mucous membranes are moist.  Eyes:     Pupils: Pupils are equal, round, and reactive to light.  Cardiovascular:     Rate and Rhythm: Normal rate and regular rhythm.  Pulmonary:     Effort: Pulmonary effort is normal.     Breath sounds: Normal breath sounds.     Comments: No wheezing  Musculoskeletal:        General: Normal range of motion.  Neurological:     General: No focal deficit present.     Mental Status: He is alert and oriented to person, place, and time.    PFT   04/18/24  FVC   4.4/-1.1/87% FEV1  3.7/-0.3/96% F/F 85 TLC 7.5/+0.4/104% RV 2.85/+1.8/131% RV/TLC 38 DLCO   36/+1.4/125  04/18/24 = normal spirometry, normal lung volumes, normal dlco. No response to BD  Assessment & Plan:   Assessment & Plan Chronic cough  Chronic cough since 12/2023 , tolerable 2-3 episodes a day, able to sleep. He had occasional post nasal drip and GERD is present but it seems it well controlled without medication. He has tried albuterol  without  significant relief.  Denied dysphagia. His CXR and PFT are completely normal, without concern for any pulm conditions. Given that he mild cough, I will recommend just monitoring. I have low concern for VCD.  - Prescribed Tessalon  Perles as needed for severe cough. - RTC in 6 months.   Return in about 6 months (around 10/17/2024).    Marny Patch, MD Pulmonary and Critical Care Medicine [facility] "

## 2024-04-18 NOTE — Patient Instructions (Signed)
 Full PFT performed today.

## 2024-04-18 NOTE — Progress Notes (Signed)
 Full PFT performed today.

## 2024-04-19 ENCOUNTER — Other Ambulatory Visit (HOSPITAL_BASED_OUTPATIENT_CLINIC_OR_DEPARTMENT_OTHER): Payer: Self-pay

## 2024-04-19 ENCOUNTER — Ambulatory Visit

## 2024-04-19 VITALS — BP 120/72 | HR 90 | Ht 71.0 in | Wt 256.0 lb

## 2024-04-19 DIAGNOSIS — R053 Chronic cough: Secondary | ICD-10-CM

## 2024-04-19 MED ORDER — BENZONATATE 200 MG PO CAPS
200.0000 mg | ORAL_CAPSULE | Freq: Three times a day (TID) | ORAL | 3 refills | Status: DC | PRN
  Filled 2024-04-19: qty 30, 10d supply, fill #0

## 2024-04-25 ENCOUNTER — Other Ambulatory Visit

## 2024-04-25 DIAGNOSIS — M1711 Unilateral primary osteoarthritis, right knee: Secondary | ICD-10-CM

## 2024-04-26 ENCOUNTER — Ambulatory Visit: Payer: Self-pay | Admitting: Physician Assistant

## 2024-04-26 NOTE — Progress Notes (Signed)
F/u with xu

## 2024-05-08 ENCOUNTER — Ambulatory Visit: Admitting: Orthopaedic Surgery

## 2024-05-09 ENCOUNTER — Ambulatory Visit: Admitting: Orthopaedic Surgery

## 2024-05-09 DIAGNOSIS — M1711 Unilateral primary osteoarthritis, right knee: Secondary | ICD-10-CM | POA: Diagnosis not present

## 2024-05-09 MED ORDER — LIDOCAINE HCL 1 % IJ SOLN
2.0000 mL | INTRAMUSCULAR | Status: AC | PRN
  Administered 2024-05-09: 2 mL

## 2024-05-09 MED ORDER — METHYLPREDNISOLONE ACETATE 40 MG/ML IJ SUSP
40.0000 mg | INTRAMUSCULAR | Status: AC | PRN
  Administered 2024-05-09: 40 mg via INTRA_ARTICULAR

## 2024-05-09 MED ORDER — BUPIVACAINE HCL 0.5 % IJ SOLN
2.0000 mL | INTRAMUSCULAR | Status: AC | PRN
  Administered 2024-05-09: 2 mL via INTRA_ARTICULAR

## 2024-05-09 NOTE — Progress Notes (Signed)
 "  Office Visit Note   Patient: William Leon           Date of Birth: 04-Feb-1971           MRN: [id number] Visit Date: 05/09/2024              Requested by: Mahlon Comer BRAVO, MD [id number] [place],  [place] [address] PCP: Mahlon Comer BRAVO, MD   Assessment & Plan: Visit Diagnoses:  1. Primary osteoarthritis of right knee     Plan: MRI findings consistent with advanced tricompartmental degenerative joint disease with degenerative meniscal tears.  Large effusion and synovitis present which is mainly the source of his symptoms.  I performed knee aspiration and cortisone injection today.  Obtained 25 cc of synovial fluid.  He tolerated the procedure well.  We had an in-depth discussion about total knee replacement surgery and what recovery looks like.  He is uneasy about taking NSAIDs as he has borderline chronic kidney disease.  He will think about his options.  Follow-up as needed.  Total face to face encounter time was greater than 25 minutes and over half of this time was spent in counseling and/or coordination of care.  Follow-Up Instructions: Return if symptoms worsen or fail to improve.   Orders:  No orders of the defined types were placed in this encounter.  No orders of the defined types were placed in this encounter.     Procedures: Large Joint Inj: R knee on 05/09/2024 11:58 AM Indications: pain Details: 22 G needle  Arthrogram: No  Medications: 40 mg methylPREDNISolone  acetate 40 MG/ML; 2 mL lidocaine  1 %; 2 mL bupivacaine  0.5 % Consent was given by the patient. Patient was prepped and draped in the usual sterile fashion.       Clinical Data: No additional findings.   Subjective: Chief Complaint  Patient presents with   Right Knee - Pain    MRI REVIEW    HPI William Leon returns today for MRI review Review of Systems  Constitutional: Negative.   HENT: Negative.    Eyes: Negative.   Respiratory: Negative.    Cardiovascular: Negative.    Gastrointestinal: Negative.   Endocrine: Negative.   Genitourinary: Negative.   Skin: Negative.   Allergic/Immunologic: Negative.   Neurological: Negative.   Hematological: Negative.   Psychiatric/Behavioral: Negative.    All other systems reviewed and are negative.    Objective: Vital Signs: There were no vitals taken for this visit.  Physical Exam Vitals and nursing note reviewed.  Constitutional:      Appearance: He is well-developed.  HENT:     Head: Normocephalic and atraumatic.  Eyes:     Pupils: Pupils are equal, round, and reactive to light.  Pulmonary:     Effort: Pulmonary effort is normal.  Abdominal:     Palpations: Abdomen is soft.  Musculoskeletal:        General: Normal range of motion.     Cervical back: Neck supple.  Skin:    General: Skin is warm.  Neurological:     Mental Status: He is alert and oriented to person, place, and time.  Psychiatric:        Behavior: Behavior normal.        Thought Content: Thought content normal.        Judgment: Judgment normal.     Ortho Exam Examination of the right knee shows moderate joint effusion. Specialty Comments:  No specialty comments available.  Imaging: Tricompartmental  osteoarthrosis most notably of the medial compartment and lateral patellofemoral compartment. There is chondromalacia and mild subchondral reactive edema. There is a small reactive joint effusion.   Moderate sized radial tear in the posterior horn the medial meniscus with medial displacement of body. There is likely horizontal extension of the tendon body of the medial meniscus. No displaced meniscal flap.   Horizontal signal in the body of the lateral meniscus without a significant lateral meniscal tear   PMFS History: Patient Active Problem List   Diagnosis Date Noted   BPPV (benign paroxysmal positional vertigo) 09/23/2023   H. 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 pylori infection    Hyperlipidemia     Family History  Problem Relation Age of Onset   Alcohol abuse Brother    Bowel Disease Brother    Colon cancer Neg Hx    Colon polyps Neg Hx    Esophageal cancer Neg Hx    Rectal cancer Neg Hx    Stomach cancer Neg Hx    Crohn's disease Neg Hx    Ulcerative colitis Neg Hx     Past Surgical History:  Procedure Laterality Date   ACHILLES TENDON SURGERY Right 01/2017   BIOPSY  02/19/2020   Procedure: BIOPSY;  Surgeon: Eda Iha, MD;  Location: [facility];  Service: Gastroenterology;;   ESOPHAGOGASTRODUODENOSCOPY (EGD) WITH PROPOFOL  N/A 02/19/2020   Procedure: ESOPHAGOGASTRODUODENOSCOPY (EGD) WITH PROPOFOL ;  Surgeon: Eda Iha, MD;  Location: [facility];  Service: Gastroenterology;  Laterality: N/A;   FRACTURE SURGERY  Oct/2018   Achilles tendon repair   KNEE SURGERY Left    ARTHOSCOPY   Social History   Occupational History   Not on file  Tobacco Use   Smoking status: Never   Smokeless tobacco: Never  Vaping Use   Vaping status: Never Used  Substance and Sexual Activity   Alcohol use: No    Alcohol/week: 0.0 standard drinks of alcohol   Drug use: No   Sexual activity: Yes    Birth control/protection: None        "

## 2024-05-17 ENCOUNTER — Encounter: Payer: Self-pay | Admitting: Family Medicine

## 2024-05-17 ENCOUNTER — Ambulatory Visit: Admitting: Family Medicine

## 2024-05-17 VITALS — BP 120/82 | HR 82 | Ht 71.0 in | Wt 255.0 lb

## 2024-05-17 DIAGNOSIS — E785 Hyperlipidemia, unspecified: Secondary | ICD-10-CM

## 2024-05-17 DIAGNOSIS — Z23 Encounter for immunization: Secondary | ICD-10-CM

## 2024-05-17 DIAGNOSIS — L98431 Non-pressure chronic ulcer of abdomen limited to breakdown of skin: Secondary | ICD-10-CM

## 2024-05-17 LAB — HEPATIC FUNCTION PANEL
ALT: 26 U/L (ref 3–53)
AST: 25 U/L (ref 5–37)
Albumin: 4.5 g/dL (ref 3.5–5.2)
Alkaline Phosphatase: 63 U/L (ref 39–117)
Bilirubin, Direct: 0.2 mg/dL (ref 0.1–0.3)
Total Bilirubin: 0.7 mg/dL (ref 0.2–1.2)
Total Protein: 7.8 g/dL (ref 6.0–8.3)

## 2024-05-17 LAB — BASIC METABOLIC PANEL WITH GFR
BUN: 15 mg/dL (ref 6–23)
CO2: 29 meq/L (ref 19–32)
Calcium: 9.8 mg/dL (ref 8.4–10.5)
Chloride: 102 meq/L (ref 96–112)
Creatinine, Ser: 1.4 mg/dL (ref 0.40–1.50)
GFR: 56.73 mL/min — ABNORMAL LOW
Glucose, Bld: 98 mg/dL (ref 70–99)
Potassium: 4.1 meq/L (ref 3.5–5.1)
Sodium: 139 meq/L (ref 135–145)

## 2024-05-17 LAB — TSH: TSH: 1.92 u[IU]/mL (ref 0.35–5.50)

## 2024-05-17 LAB — LIPID PANEL
Cholesterol: 214 mg/dL — ABNORMAL HIGH (ref 28–200)
HDL: 39.2 mg/dL
LDL Cholesterol: 156 mg/dL — ABNORMAL HIGH (ref 10–99)
NonHDL: 174.95
Total CHOL/HDL Ratio: 5
Triglycerides: 94 mg/dL (ref 10.0–149.0)
VLDL: 18.8 mg/dL (ref 0.0–40.0)

## 2024-05-17 LAB — CBC WITH DIFFERENTIAL/PLATELET
Basophils Absolute: 0.1 10*3/uL (ref 0.0–0.1)
Basophils Relative: 0.9 % (ref 0.0–3.0)
Eosinophils Absolute: 0.2 10*3/uL (ref 0.0–0.7)
Eosinophils Relative: 3.2 % (ref 0.0–5.0)
HCT: 53.1 % — ABNORMAL HIGH (ref 39.0–52.0)
Hemoglobin: 17.9 g/dL — ABNORMAL HIGH (ref 13.0–17.0)
Lymphocytes Relative: 22.9 % (ref 12.0–46.0)
Lymphs Abs: 1.7 10*3/uL (ref 0.7–4.0)
MCHC: 33.7 g/dL (ref 30.0–36.0)
MCV: 87.9 fl (ref 78.0–100.0)
Monocytes Absolute: 0.5 10*3/uL (ref 0.1–1.0)
Monocytes Relative: 7.2 % (ref 3.0–12.0)
Neutro Abs: 4.8 10*3/uL (ref 1.4–7.7)
Neutrophils Relative %: 65.8 % (ref 43.0–77.0)
Platelets: 188 10*3/uL (ref 150.0–400.0)
RBC: 6.04 Mil/uL — ABNORMAL HIGH (ref 4.22–5.81)
RDW: 15.2 % (ref 11.5–15.5)
WBC: 7.3 10*3/uL (ref 4.0–10.5)

## 2024-05-17 NOTE — Patient Instructions (Addendum)
 Schedule your complete physical in 6 months We'll notify you of your lab results and make any changes if needed Continue to work on healthy diet and regular exercise- you're doing great! Get OTC Bacitracin ointment to apply to the area on your stomach 1-2x/day until healed Call with any questions or concerns Stay Safe!  Stay Healthy!

## 2024-05-17 NOTE — Progress Notes (Unsigned)
" ° °  Subjective:    Patient ID: William Leon, male    DOB: [birth year], 54 y.o.   MRN: [id number]  HPI Hyperlipidemia- pt stopped his Lipitor months ago due to ongoing joint pain.  Denies abd pain, N/V, CP, SOB  Obesity- weight and BMI are stable at 255 lbs and 35.57.  pt is in the process of being evaluated for knee replacement.  Abd sore- pt reports area has been present and he will repeatedly scratch or pick when scab develops.     Review of Systems For ROS see HPI     Objective:   Physical Exam        Assessment & Plan:    "

## 2024-05-18 ENCOUNTER — Ambulatory Visit: Payer: Self-pay | Admitting: Family Medicine

## 2024-05-18 ENCOUNTER — Other Ambulatory Visit (HOSPITAL_BASED_OUTPATIENT_CLINIC_OR_DEPARTMENT_OTHER): Payer: Self-pay

## 2024-05-18 MED ORDER — EZETIMIBE 10 MG PO TABS
10.0000 mg | ORAL_TABLET | Freq: Every day | ORAL | 1 refills | Status: AC
  Filled 2024-05-18: qty 30, 30d supply, fill #0

## 2024-05-18 NOTE — Telephone Encounter (Signed)
 Labs reviewed by patient  Sent in medication to patients pharmacy on file

## 2024-11-14 ENCOUNTER — Encounter: Admitting: Family Medicine
